# Patient Record
Sex: Female | Born: 1960 | Race: White | Hispanic: No | Marital: Married | Smoking: Former smoker
Health system: Southern US, Community
[De-identification: ages and names within clinical notes are randomized; demographics above are authoritative.]

## PROBLEM LIST (undated history)

## (undated) DIAGNOSIS — I10 Essential (primary) hypertension: Secondary | ICD-10-CM

## (undated) DIAGNOSIS — Z8639 Personal history of other endocrine, nutritional and metabolic disease: Secondary | ICD-10-CM

## (undated) DIAGNOSIS — Z8679 Personal history of other diseases of the circulatory system: Secondary | ICD-10-CM

## (undated) DIAGNOSIS — Z8619 Personal history of other infectious and parasitic diseases: Secondary | ICD-10-CM

## (undated) HISTORY — DX: Personal history of other infectious and parasitic diseases: Z86.19

## (undated) HISTORY — DX: Personal history of other endocrine, nutritional and metabolic disease: Z86.39

## (undated) HISTORY — PX: BACK SURGERY: SHX140

## (undated) HISTORY — DX: Personal history of other diseases of the circulatory system: Z86.79

---

## 1988-11-06 HISTORY — PX: APPENDECTOMY: SHX54

## 2000-01-30 ENCOUNTER — Other Ambulatory Visit: Admission: RE | Admit: 2000-01-30 | Discharge: 2000-01-30 | Payer: Self-pay | Admitting: Family Medicine

## 2000-02-27 ENCOUNTER — Encounter: Payer: Self-pay | Admitting: Family Medicine

## 2000-02-27 ENCOUNTER — Encounter: Admission: RE | Admit: 2000-02-27 | Discharge: 2000-02-27 | Payer: Self-pay | Admitting: Family Medicine

## 2001-02-28 ENCOUNTER — Other Ambulatory Visit: Admission: RE | Admit: 2001-02-28 | Discharge: 2001-02-28 | Payer: Self-pay | Admitting: Family Medicine

## 2001-09-13 ENCOUNTER — Encounter: Admission: RE | Admit: 2001-09-13 | Discharge: 2001-09-13 | Payer: Self-pay | Admitting: Family Medicine

## 2001-09-13 ENCOUNTER — Encounter: Payer: Self-pay | Admitting: Family Medicine

## 2002-03-03 ENCOUNTER — Other Ambulatory Visit: Admission: RE | Admit: 2002-03-03 | Discharge: 2002-03-03 | Payer: Self-pay | Admitting: Family Medicine

## 2002-12-10 ENCOUNTER — Encounter: Payer: Self-pay | Admitting: Family Medicine

## 2002-12-10 ENCOUNTER — Encounter: Admission: RE | Admit: 2002-12-10 | Discharge: 2002-12-10 | Payer: Self-pay | Admitting: Family Medicine

## 2003-04-09 ENCOUNTER — Other Ambulatory Visit: Admission: RE | Admit: 2003-04-09 | Discharge: 2003-04-09 | Payer: Self-pay | Admitting: Family Medicine

## 2004-05-02 ENCOUNTER — Encounter: Admission: RE | Admit: 2004-05-02 | Discharge: 2004-05-02 | Payer: Self-pay | Admitting: Family Medicine

## 2004-12-13 ENCOUNTER — Other Ambulatory Visit: Admission: RE | Admit: 2004-12-13 | Discharge: 2004-12-13 | Payer: Self-pay | Admitting: Family Medicine

## 2004-12-13 ENCOUNTER — Ambulatory Visit: Payer: Self-pay | Admitting: Family Medicine

## 2006-09-04 ENCOUNTER — Ambulatory Visit: Payer: Self-pay | Admitting: Family Medicine

## 2006-10-05 ENCOUNTER — Ambulatory Visit: Payer: Self-pay | Admitting: Family Medicine

## 2006-10-09 ENCOUNTER — Ambulatory Visit: Payer: Self-pay | Admitting: Family Medicine

## 2006-12-27 ENCOUNTER — Encounter (INDEPENDENT_AMBULATORY_CARE_PROVIDER_SITE_OTHER): Payer: Self-pay | Admitting: *Deleted

## 2006-12-27 ENCOUNTER — Ambulatory Visit (HOSPITAL_COMMUNITY): Admission: RE | Admit: 2006-12-27 | Discharge: 2006-12-28 | Payer: Self-pay | Admitting: Specialist

## 2007-11-29 ENCOUNTER — Encounter (INDEPENDENT_AMBULATORY_CARE_PROVIDER_SITE_OTHER): Payer: Self-pay | Admitting: *Deleted

## 2007-12-10 ENCOUNTER — Telehealth: Payer: Self-pay | Admitting: Family Medicine

## 2008-01-14 ENCOUNTER — Encounter: Payer: Self-pay | Admitting: Family Medicine

## 2008-01-14 ENCOUNTER — Ambulatory Visit: Payer: Self-pay | Admitting: Family Medicine

## 2008-01-15 ENCOUNTER — Ambulatory Visit: Payer: Self-pay | Admitting: Family Medicine

## 2008-01-15 DIAGNOSIS — N946 Dysmenorrhea, unspecified: Secondary | ICD-10-CM | POA: Insufficient documentation

## 2008-01-15 DIAGNOSIS — E7800 Pure hypercholesterolemia, unspecified: Secondary | ICD-10-CM

## 2008-01-15 DIAGNOSIS — M5137 Other intervertebral disc degeneration, lumbosacral region: Secondary | ICD-10-CM

## 2008-01-15 DIAGNOSIS — Z87891 Personal history of nicotine dependence: Secondary | ICD-10-CM | POA: Insufficient documentation

## 2008-01-15 DIAGNOSIS — M51379 Other intervertebral disc degeneration, lumbosacral region without mention of lumbar back pain or lower extremity pain: Secondary | ICD-10-CM | POA: Insufficient documentation

## 2008-01-15 DIAGNOSIS — J301 Allergic rhinitis due to pollen: Secondary | ICD-10-CM | POA: Insufficient documentation

## 2008-01-20 ENCOUNTER — Encounter (INDEPENDENT_AMBULATORY_CARE_PROVIDER_SITE_OTHER): Payer: Self-pay | Admitting: *Deleted

## 2008-07-03 ENCOUNTER — Telehealth (INDEPENDENT_AMBULATORY_CARE_PROVIDER_SITE_OTHER): Payer: Self-pay | Admitting: *Deleted

## 2008-10-05 ENCOUNTER — Ambulatory Visit: Payer: Self-pay | Admitting: Family Medicine

## 2008-10-05 ENCOUNTER — Other Ambulatory Visit: Admission: RE | Admit: 2008-10-05 | Discharge: 2008-10-05 | Payer: Self-pay | Admitting: Family Medicine

## 2008-10-05 ENCOUNTER — Encounter: Payer: Self-pay | Admitting: Family Medicine

## 2008-10-05 DIAGNOSIS — R4589 Other symptoms and signs involving emotional state: Secondary | ICD-10-CM | POA: Insufficient documentation

## 2008-10-05 DIAGNOSIS — E039 Hypothyroidism, unspecified: Secondary | ICD-10-CM | POA: Insufficient documentation

## 2008-10-09 ENCOUNTER — Encounter (INDEPENDENT_AMBULATORY_CARE_PROVIDER_SITE_OTHER): Payer: Self-pay | Admitting: *Deleted

## 2008-11-05 ENCOUNTER — Ambulatory Visit: Payer: Self-pay | Admitting: Family Medicine

## 2008-11-09 LAB — CONVERTED CEMR LAB
Alkaline Phosphatase: 105 units/L (ref 39–117)
Basophils Absolute: 0.1 10*3/uL (ref 0.0–0.1)
Basophils Relative: 0.8 % (ref 0.0–3.0)
Bilirubin, Direct: 0.1 mg/dL (ref 0.0–0.3)
Cholesterol: 200 mg/dL (ref 0–200)
Creatinine, Ser: 0.7 mg/dL (ref 0.4–1.2)
Direct LDL: 134.4 mg/dL
Eosinophils Absolute: 0.2 10*3/uL (ref 0.0–0.7)
Free T4: 0.7 ng/dL (ref 0.6–1.6)
GFR calc Af Amer: 115 mL/min
GFR calc non Af Amer: 95 mL/min
HDL: 23.7 mg/dL — ABNORMAL LOW (ref >39.0)
Lymphocytes Relative: 21.2 % (ref 12.0–46.0)
Neutro Abs: 8.4 10*3/uL — ABNORMAL HIGH (ref 1.4–7.7)
Neutrophils Relative %: 72.7 % (ref 43.0–77.0)
Platelets: 335 10*3/uL (ref 150–400)
Potassium: 4.2 meq/L (ref 3.5–5.1)
Triglycerides: 202 mg/dL (ref 0–149)
VLDL: 40 mg/dL (ref 0–40)

## 2010-11-27 ENCOUNTER — Encounter: Payer: Self-pay | Admitting: Family Medicine

## 2011-03-24 NOTE — Op Note (Signed)
NAMECASANDRA, Pam Patel                ACCOUNT NO.:  [id number]   MEDICAL RECORD NO.:  [id number]         PATIENT TYPE:  LOIB   LOCATION:                                FACILITY:  [facility]   PHYSICIAN:  Jene Every, M.D.    DATE OF BIRTH:  05-07-1961   DATE OF PROCEDURE:  12/27/2006  DATE OF DISCHARGE:                               OPERATIVE REPORT   PREOPERATIVE DIAGNOSIS:  Herniated nucleus pulposus, L5-S1, right.   POSTOPERATIVE DIAGNOSIS:  Herniated nucleus pulposus, L5-S1, right.   PROCEDURE PERFORMED:  Lateral recess decompression, foraminotomy of S1,  microdiskectomy, L5-S1.   ANESTHESIA:  General.   ASSISTANT:  Roma Schanz, P.A.   BRIEF HISTORY/INDICATIONS:  A 50 year old with S1 radiculopathy,  compressed nerve root, positive neural tension signs, mild __________  weakness.  MRI indicating focal disk herniation at 5-1, noncompressive  disk protrusion of 4-5.  She was indicated for decompression at 5-1.  The risks and benefits have been discussed, including bleeding,  infection, damage to vascular structures, CSF leakage, epidural  fibrosis, adjacent segment disease, need for fusion in the future,  anesthetic complications, possible rupture, need for surgery at 4-5 disk  space in the future.   PREOPERATIVE HISTORY:  She was tobacco-dependent and was placed on  Chantix and Ativan for tobacco cessation program.   TECHNIQUE:  With the patient in the supine position after the induction  of adequate general anesthesia and 1 gm of Kefzol, she was placed prone  on the [place] frame.  All bony prominences were well padded.  The  lumbar region was prepped and draped in the usual sterile fashion.  Two  18 gauge spinal needles were utilized to localize the 4-5 interspace,  confirmed with x-ray.  An incision was made from the spinous process of  5 to S1.  Subcutaneous tissue was dissected.  Electrocautery was  utilized to achieve hemostasis.  Dorsal lumbar fascia was identified  and  divided along the skin incision.  The paraspinous muscle elevated from  the lamina of 5 and S1.  McCullough retractor was placed.  Operating  microscope was draped and brought into the surgical field, confirmed by  x-ray at the 5-1 space.  Ligamentum flavum was detached from the  cephalad edge of S-1 utilizing a straight curette.  The ligamentum  flavum was removed from the interspace.  Compression of the S1 nerve  root was noted in the lateral recess.  A foraminotomy of S-1 was  performed.  I decompressed the lateral recess at the medial border of  the pedicle, gently mobilized the S-1 nerve root medially.  Identified a  focal HNP, performed an annulotomy.  Copious portion of the disk  material was removed from the disk space with straight and up-biting  pituitary, removing the disk herniation.  Multiple passes.  This freed  the nerve root with then of at least 1 cm of excursion medial to the  pedicle without tension.  I checked the axilla of the root, the shoulder  beneath the thecal sac at the foramen of 4 and 5, and there was no  residual  disk herniation or bony prominences compressing the S-1 root.  It was erythematous and edematous.  I copiously irrigated the disk space  with antibiotic irrigation, utilizing a catheter.  This was then  evacuated.  Inspection revealed no evidence of CSF leakage or active  bleeding.  I placed 1 cc of fentanyl in the epidural space.  Removed the  [facility] retractor.  Paraspinous muscles were inspected.  No evidence  of active bleeding.  The dorsal lumbar fascia was reapproximated with 0  Vicryl simple interrupted sutures.  The subcutaneous tissue was  reapproximated with 2-0 Vicryl simple sutures.  The skin was  reapproximated with Dermabond, reinforced with Steri-Strips.  Sterile  dressing was applied and placed supine on the hospital bed.  Extubated  without difficulty and transported to the recovery room in satisfactory  condition.   The  patient tolerated the procedure well with no complications.  Assistant was [employer].      Jene Every, M.D.  Electronically Signed     JB/MEDQ  D:  12/27/2006  T:  12/27/2006  Job:  [id number]

## 2018-02-20 ENCOUNTER — Inpatient Hospital Stay: Payer: Self-pay

## 2018-02-20 ENCOUNTER — Other Ambulatory Visit: Payer: Self-pay

## 2018-02-20 ENCOUNTER — Emergency Department: Payer: Self-pay

## 2018-02-20 ENCOUNTER — Inpatient Hospital Stay
Admission: EM | Admit: 2018-02-20 | Discharge: 2018-02-23 | DRG: 264 | Disposition: A | Discharge status: Home Health | Payer: Self-pay | Attending: Internal Medicine | Admitting: Internal Medicine

## 2018-02-20 ENCOUNTER — Encounter: Payer: Self-pay | Admitting: Emergency Medicine

## 2018-02-20 DIAGNOSIS — R079 Chest pain, unspecified: Secondary | ICD-10-CM

## 2018-02-20 DIAGNOSIS — Z87891 Personal history of nicotine dependence: Secondary | ICD-10-CM

## 2018-02-20 DIAGNOSIS — I214 Non-ST elevation (NSTEMI) myocardial infarction: Secondary | ICD-10-CM

## 2018-02-20 DIAGNOSIS — E119 Type 2 diabetes mellitus without complications: Secondary | ICD-10-CM | POA: Diagnosis present

## 2018-02-20 DIAGNOSIS — R911 Solitary pulmonary nodule: Secondary | ICD-10-CM | POA: Diagnosis present

## 2018-02-20 DIAGNOSIS — L0211 Cutaneous abscess of neck: Secondary | ICD-10-CM | POA: Diagnosis present

## 2018-02-20 DIAGNOSIS — Z808 Family history of malignant neoplasm of other organs or systems: Secondary | ICD-10-CM

## 2018-02-20 DIAGNOSIS — Z886 Allergy status to analgesic agent status: Secondary | ICD-10-CM

## 2018-02-20 DIAGNOSIS — E785 Hyperlipidemia, unspecified: Secondary | ICD-10-CM | POA: Diagnosis present

## 2018-02-20 DIAGNOSIS — R591 Generalized enlarged lymph nodes: Secondary | ICD-10-CM

## 2018-02-20 DIAGNOSIS — I248 Other forms of acute ischemic heart disease: Secondary | ICD-10-CM | POA: Diagnosis present

## 2018-02-20 DIAGNOSIS — Z8249 Family history of ischemic heart disease and other diseases of the circulatory system: Secondary | ICD-10-CM

## 2018-02-20 DIAGNOSIS — E041 Nontoxic single thyroid nodule: Secondary | ICD-10-CM | POA: Diagnosis present

## 2018-02-20 DIAGNOSIS — R590 Localized enlarged lymph nodes: Secondary | ICD-10-CM | POA: Diagnosis present

## 2018-02-20 DIAGNOSIS — R221 Localized swelling, mass and lump, neck: Secondary | ICD-10-CM | POA: Diagnosis present

## 2018-02-20 DIAGNOSIS — E039 Hypothyroidism, unspecified: Secondary | ICD-10-CM | POA: Diagnosis present

## 2018-02-20 DIAGNOSIS — I808 Phlebitis and thrombophlebitis of other sites: Principal | ICD-10-CM | POA: Diagnosis present

## 2018-02-20 DIAGNOSIS — I82C12 Acute embolism and thrombosis of left internal jugular vein: Secondary | ICD-10-CM | POA: Diagnosis present

## 2018-02-20 DIAGNOSIS — I10 Essential (primary) hypertension: Secondary | ICD-10-CM | POA: Diagnosis present

## 2018-02-20 HISTORY — DX: Essential (primary) hypertension: I10

## 2018-02-20 LAB — BASIC METABOLIC PANEL
Anion gap: 12 (ref 5–15)
BUN: 11 mg/dL (ref 6–20)
CHLORIDE: 100 mmol/L — AB (ref 101–111)
CO2: 20 mmol/L — ABNORMAL LOW (ref 22–32)
Calcium: 10.2 mg/dL (ref 8.9–10.3)
Creatinine, Ser: 0.64 mg/dL (ref 0.44–1.00)
Glucose, Bld: 378 mg/dL — ABNORMAL HIGH (ref 65–99)
POTASSIUM: 3.7 mmol/L (ref 3.5–5.1)
SODIUM: 132 mmol/L — AB (ref 135–145)

## 2018-02-20 LAB — PROTIME-INR
INR: 1.1
Prothrombin Time: 14.1 seconds (ref 11.4–15.2)

## 2018-02-20 LAB — PROCALCITONIN: Procalcitonin: 0.21 ng/mL

## 2018-02-20 LAB — CBC
HEMATOCRIT: 41.9 % (ref 35.0–47.0)
HEMOGLOBIN: 14.3 g/dL (ref 12.0–16.0)
MCH: 30.2 pg (ref 26.0–34.0)
MCHC: 34.2 g/dL (ref 32.0–36.0)
MCV: 88.3 fL (ref 80.0–100.0)
Platelets: 336 10*3/uL (ref 150–440)
RBC: 4.75 MIL/uL (ref 3.80–5.20)
RDW: 12.4 % (ref 11.5–14.5)
WBC: 14.2 10*3/uL — AB (ref 3.6–11.0)

## 2018-02-20 LAB — LACTIC ACID, PLASMA
LACTIC ACID, VENOUS: 1.1 mmol/L (ref 0.5–1.9)
Lactic Acid, Venous: 1.2 mmol/L (ref 0.5–1.9)

## 2018-02-20 LAB — APTT: aPTT: 37 seconds — ABNORMAL HIGH (ref 24–36)

## 2018-02-20 LAB — TROPONIN I
TROPONIN I: 0.18 ng/mL — AB (ref <0.03)
Troponin I: <0.03 ng/mL (ref <0.03)
Troponin I: 0.35 ng/mL (ref <0.03)

## 2018-02-20 LAB — HEPARIN LEVEL (UNFRACTIONATED)

## 2018-02-20 LAB — TSH: TSH: 5.464 u[IU]/mL — AB (ref 0.350–4.500)

## 2018-02-20 MED ORDER — HEPARIN (PORCINE) IN NACL 100-0.45 UNIT/ML-% IJ SOLN
1850.0000 [IU]/h | INTRAMUSCULAR | Status: DC
  Administered 2018-02-20: 900 [IU]/h via INTRAVENOUS
  Administered 2018-02-21: 1450 [IU]/h via INTRAVENOUS
  Administered 2018-02-22: 1850 [IU]/h via INTRAVENOUS
  Administered 2018-02-22: 1700 [IU]/h via INTRAVENOUS
  Filled 2018-02-20 (×4): qty 250

## 2018-02-20 MED ORDER — ATORVASTATIN CALCIUM 20 MG PO TABS
20.0000 mg | ORAL_TABLET | Freq: Every day | ORAL | Status: DC
  Administered 2018-02-20 – 2018-02-22 (×3): 20 mg via ORAL
  Filled 2018-02-20 (×3): qty 1

## 2018-02-20 MED ORDER — NITROGLYCERIN 0.4 MG SL SUBL
SUBLINGUAL_TABLET | SUBLINGUAL | Status: AC
  Administered 2018-02-20: 17:00:00
  Filled 2018-02-20: qty 1

## 2018-02-20 MED ORDER — ASPIRIN EC 81 MG PO TBEC
81.0000 mg | DELAYED_RELEASE_TABLET | Freq: Every day | ORAL | Status: DC
  Administered 2018-02-21 – 2018-02-23 (×3): 81 mg via ORAL
  Filled 2018-02-20 (×3): qty 1

## 2018-02-20 MED ORDER — DOCUSATE SODIUM 100 MG PO CAPS
100.0000 mg | ORAL_CAPSULE | Freq: Two times a day (BID) | ORAL | Status: DC
  Administered 2018-02-21 – 2018-02-23 (×5): 100 mg via ORAL
  Filled 2018-02-20 (×5): qty 1

## 2018-02-20 MED ORDER — SODIUM CHLORIDE 0.9 % IV SOLN
1.0000 g | Freq: Three times a day (TID) | INTRAVENOUS | Status: DC
  Administered 2018-02-20 – 2018-02-22 (×4): 1 g via INTRAVENOUS
  Filled 2018-02-20 (×7): qty 1

## 2018-02-20 MED ORDER — ACETAMINOPHEN 500 MG PO TABS
500.0000 mg | ORAL_TABLET | Freq: Four times a day (QID) | ORAL | Status: DC | PRN
Start: 2018-02-20 — End: 2018-02-23
  Administered 2018-02-20 – 2018-02-21 (×3): 500 mg via ORAL
  Filled 2018-02-20 (×3): qty 1

## 2018-02-20 MED ORDER — VANCOMYCIN HCL IN DEXTROSE 1-5 GM/200ML-% IV SOLN
1000.0000 mg | Freq: Two times a day (BID) | INTRAVENOUS | Status: DC
  Administered 2018-02-21: 1000 mg via INTRAVENOUS
  Filled 2018-02-20 (×2): qty 200

## 2018-02-20 MED ORDER — ONDANSETRON HCL 4 MG PO TABS: 4.0000 mg | ORAL_TABLET | Freq: Four times a day (QID) | ORAL | Status: DC | PRN

## 2018-02-20 MED ORDER — LEVOFLOXACIN IN D5W 750 MG/150ML IV SOLN: 750.0000 mg | Freq: Once | INTRAVENOUS | Status: DC

## 2018-02-20 MED ORDER — VANCOMYCIN HCL IN DEXTROSE 1-5 GM/200ML-% IV SOLN
1000.0000 mg | Freq: Once | INTRAVENOUS | Status: AC
  Administered 2018-02-20: 1000 mg via INTRAVENOUS
  Filled 2018-02-20: qty 200

## 2018-02-20 MED ORDER — SODIUM CHLORIDE 0.9 % IV SOLN
INTRAVENOUS | Status: AC
  Administered 2018-02-20 – 2018-02-21 (×4): via INTRAVENOUS

## 2018-02-20 MED ORDER — LEVOTHYROXINE SODIUM 25 MCG PO TABS
25.0000 ug | ORAL_TABLET | Freq: Every day | ORAL | Status: DC
  Administered 2018-02-21 – 2018-02-23 (×3): 25 ug via ORAL
  Filled 2018-02-20 (×3): qty 1

## 2018-02-20 MED ORDER — SODIUM CHLORIDE 0.9 % IV SOLN: 2.0000 g | Freq: Once | INTRAVENOUS | Status: DC

## 2018-02-20 MED ORDER — TRAMADOL HCL 50 MG PO TABS
50.0000 mg | ORAL_TABLET | Freq: Four times a day (QID) | ORAL | Status: DC | PRN
  Administered 2018-02-20 – 2018-02-21 (×2): 50 mg via ORAL
  Filled 2018-02-20 (×2): qty 1

## 2018-02-20 MED ORDER — IOHEXOL 300 MG/ML  SOLN
75.0000 mL | Freq: Once | INTRAMUSCULAR | Status: AC | PRN
  Administered 2018-02-20: 75 mL via INTRAVENOUS
  Filled 2018-02-20: qty 75

## 2018-02-20 MED ORDER — ONDANSETRON HCL 4 MG/2ML IJ SOLN: 4.0000 mg | Freq: Four times a day (QID) | INTRAMUSCULAR | Status: DC | PRN

## 2018-02-20 MED ORDER — HEPARIN BOLUS VIA INFUSION
4000.0000 [IU] | Freq: Once | INTRAVENOUS | Status: AC
  Administered 2018-02-20: 4000 [IU] via INTRAVENOUS
  Filled 2018-02-20: qty 4000

## 2018-02-20 NOTE — ED Notes (Signed)
Pt back from US at this time.

## 2018-02-20 NOTE — Progress Notes (Signed)
Family Meeting Note  Advance Directive:yes  Today a meeting took place with the Patient.     The following clinical team members were present during this meeting:MD  The following were discussed:Patient's diagnosis: Left-sided neck swelling, with the possibility of cancer versus infection versus abscess, lung nodule, elevated troponin, chest pain, hypothyroidism with elevated TSH, noncompliance with the medications, treatment and plan of care discussed in detail with the patient.  She verbalized understanding of the plan  patient's progosis: Unable to determine and Goals for treatment: Full Code, husband is the healthcare power of attorney  Additional follow-up to be provided: Hospitalist, oncology, infectious disease  Time spent during discussion:17 min  Ramonita LabAruna Sheridan Hew, MD

## 2018-02-20 NOTE — ED Triage Notes (Signed)
PT arrived via ems from home with complaints of chest pain. Pain started an hour prior to ems arrival. Pt states the pain was in the center of her chest with no radiation of pain. Pt states she took 324mg  of asa prior to arrival. Pt denies any current pain.

## 2018-02-20 NOTE — Progress Notes (Signed)
CODE SEPSIS - PHARMACY COMMUNICATION  **Broad Spectrum Antibiotics should be administered within 1 hour of Sepsis diagnosis**  Time Code Sepsis Called/Page Received: 17:22  Antibiotics Ordered: Vanc/Azactam/Levofloxacin  Time of 1st antibiotic administration: Not available - RN spoke with MD and confirms this is not sepsis. They're treating a mass in neck that may be abscess.   Additional action taken by pharmacy: Spoke with Pauline AusKelley RN - she doesn't think this is sepsis - she'll clarify with MD and hang first antibiotic shortly.  If necessary, Name of Provider/Nurse Contacted: Nicholaus BloomKelley.    Pam Patel ,PharmD Clinical Pharmacist  02/20/2018  5:56 PM

## 2018-02-20 NOTE — Progress Notes (Signed)
ANTIBIOTIC CONSULT NOTE - INITIAL  Pharmacy Consult for Vancomycin, meropenem  Indication: abscess , possible sepsis  Allergies  Allergen Reactions  . Penicillins Hives    Has patient had a PCN reaction causing immediate rash, facial/tongue/throat swelling, SOB or lightheadedness with hypotension: Unknown Has patient had a PCN reaction causing severe rash involving mucus membranes or skin necrosis: No Has patient had a PCN reaction that required hospitalization: No Has patient had a PCN reaction occurring within the last 10 years: No If all of the above answers are "NO", then may proceed with Cephalosporin use.   . Naproxen     REACTION: hives    Patient Measurements: Height: 5\' 6"  (167.6 cm) Weight: 182 lb 6.4 oz (82.7 kg) IBW/kg (Calculated) : 59.3 Adjusted Body Weight: 68.7 kg   Vital Signs: Temp: 98.4 F (36.9 C) (04/17 1947) Temp Source: Oral (04/17 1947) BP: 159/96 (04/17 1947) Pulse Rate: 104 (04/17 1947) Intake/Output from previous day: No intake/output data recorded. Intake/Output from this shift: No intake/output data recorded.  Labs: Recent Labs    02/20/18 1230  WBC 14.2*  HGB 14.3  PLT 336  CREATININE 0.64   Estimated Creatinine Clearance: 83.1 mL/min (by C-G formula based on SCr of 0.64 mg/dL). No results for input(s): VANCOTROUGH, VANCOPEAK, VANCORANDOM, GENTTROUGH, GENTPEAK, GENTRANDOM, TOBRATROUGH, TOBRAPEAK, TOBRARND, AMIKACINPEAK, AMIKACINTROU, AMIKACIN in the last 72 hours.   Microbiology: No results found for this or any previous visit (from the past 720 hour(s)).  Medical History: Past Medical History:  Diagnosis Date  . Hypertension     Medications:  Medications Prior to Admission  Medication Sig Dispense Refill Last Dose  . acetaminophen (TYLENOL) 500 MG tablet Take 500 mg by mouth every 6 (six) hours as needed for mild pain.   PRn at PRN  . aspirin EC 81 MG tablet Take 81 mg by mouth daily.   02/20/2018 at 0800   Assessment: CrCl  = 83.1 ml/min  ke = 0.073 hr-1 T1/2 = 9.5 hrs Vd = 48 L   Goal of Therapy:  Vancomycin trough level 15-20 mcg/ml  Plan:  Expected duration 7 days with resolution of temperature and/or normalization of WBC   Meropenem 1 gm IV Q8H ordered to start on 4/17 @ 22:00.  Vancomycin 1 gm IV X 1 to be given on 4/17 @ 22:00. Vancomycin 1 gm IV Q12H ordered to start on 4/18 @ 0400, ~ 6 hrs after 1st dose (stacked dosing).  Will order 1st trough on 4/19 @ 15:30, which will be very close to Css.   Valerye Kobus D 02/20/2018,9:37 PM

## 2018-02-20 NOTE — H&P (Signed)
[facility] - [place] at [facility]   PATIENT NAME: Pam Patel    MR#:  [id number]  DATE OF BIRTH:  [birth year]  DATE OF ADMISSION:  02/20/2018  PRIMARY CARE PHYSICIAN: Tower, Audrie Gallus, MD   REQUESTING/REFERRING PHYSICIAN: Minna Antis, MD  CHIEF COMPLAINT:  Chest pain and left-sided neck swelling  HISTORY OF PRESENT ILLNESS:  Pam Patel  is a 57 y.o. female with a known history of hypothyroidism, stopped taking  Synthroid, essential hypertension is presenting to the ED with a chief complaint of chest pain and left-sided neck swelling.  Patient was reporting 1 month history of toothache and noticed left neck swelling 4-5 days ago with intermittent episodes of pain.  Reports approximately 20 pounds weight loss in the past 1 year but for the past 3 months he has been following diet came into the ED with.  Initial troponin is  less than 0.03 and repeat troponin troponin  0.18.  CT of the neck with contrast has revealed bulky left neck adenopathy centrally necrotic most commonly experiences associated with metastatic squamous cell carcinoma but in the setting of elevated white count it could be inflammatory or infectious.  Right upper lobe spiculated lung nodule and a thrombosed left jugular vein and thyromegaly.  Hospitalist team is called to admit the patient and patient is started on broad-spectrum IV antibiotics and  heparin drip.  During my examination patient is denying any chest pain or shortness of breath some discomfort in the left side of the neck.  No other complaints  PAST MEDICAL HISTORY:   Past Medical History:  Diagnosis Date  . Hypertension   Hypothyroidism   PAST SURGICAL HISTOIRY:   Past Surgical History:  Procedure Laterality Date  . APPENDECTOMY      SOCIAL HISTORY:   Social History   Tobacco Use  . Smoking status: Former Games developer  . Smokeless tobacco: Never Used  Substance Use Topics  . Alcohol use: Not Currently    FAMILY  HISTORY:  Hypertension runs in her family  DRUG ALLERGIES:   Allergies  Allergen Reactions  . Penicillins Hives    Has patient had a PCN reaction causing immediate rash, facial/tongue/throat swelling, SOB or lightheadedness with hypotension: Unknown Has patient had a PCN reaction causing severe rash involving mucus membranes or skin necrosis: No Has patient had a PCN reaction that required hospitalization: No Has patient had a PCN reaction occurring within the last 10 years: No If all of the above answers are "NO", then may proceed with Cephalosporin use.   . Naproxen     REACTION: hives    REVIEW OF SYSTEMS:  CONSTITUTIONAL: No fever, fatigue or weakness.  20 pounds weight loss in the past 1 year EYES: No blurred or double vision.  EARS, NOSE, AND THROAT: No tinnitus or ear pain.  Reporting left neck mass noticed 3-4 days ago RESPIRATORY: No cough, shortness of breath, wheezing or hemoptysis.  CARDIOVASCULAR: chest pain intermittently, denies orthopnea, edema.  GASTROINTESTINAL: No nausea, vomiting, diarrhea or abdominal pain.  GENITOURINARY: No dysuria, hematuria.  ENDOCRINE: No polyuria, nocturia,  HEMATOLOGY: No anemia, easy bruising or bleeding SKIN: No rash or lesion. MUSCULOSKELETAL: No joint pain or arthritis.   NEUROLOGIC: No tingling, numbness, weakness.  PSYCHIATRY: No anxiety or depression.   MEDICATIONS AT HOME:   Prior to Admission medications   Medication Sig Start Date End Date Taking? Authorizing Provider  acetaminophen (TYLENOL) 500 MG tablet Take 500 mg by mouth every 6 (six) hours as needed  for mild pain.   Yes [provider]  aspirin EC 81 MG tablet Take 81 mg by mouth daily.   Yes [provider]      VITAL SIGNS:  Blood pressure (!) 159/96, pulse (!) 104, temperature 98.4 F (36.9 C), temperature source Oral, resp. rate 18, height 5\' 6"  (1.676 m), weight 82.7 kg (182 lb 6.4 oz), SpO2 98 %.  PHYSICAL EXAMINATION:  GENERAL:  57  y.o.-year-old patient lying in the bed with no acute distress.  EYES: Pupils equal, round, reactive to light and accommodation. No scleral icterus. Extraocular muscles intact.  HEENT: Head atraumatic, normocephalic. Oropharynx and nasopharynx clear.  NECK:  Supple, no jugular venous distention.  Left lateral part of the neck palpable firm , mobile mass, nontender, no fluctuance, no purulent discharge noticed LUNGS: Normal breath sounds bilaterally, no wheezing, rales,rhonchi or crepitation. No use of accessory muscles of respiration.  CARDIOVASCULAR: S1, S2 normal. No murmurs, rubs, or gallops.  ABDOMEN: Soft, nontender, nondistended. Bowel sounds present. No organomegaly or mass.  EXTREMITIES: No pedal edema, cyanosis, or clubbing.  NEUROLOGIC: Cranial nerves II through XII are intact. Muscle strength 5/5 in all extremities. Sensation intact. Gait not checked.  PSYCHIATRIC: The patient is alert and oriented x 3.  SKIN: No obvious rash, lesion, or ulcer.   LABORATORY PANEL:   CBC Recent Labs  Lab 02/20/18 1230  WBC 14.2*  HGB 14.3  HCT 41.9  PLT 336   ------------------------------------------------------------------------------------------------------------------  Chemistries  Recent Labs  Lab 02/20/18 1230  NA 132*  K 3.7  CL 100*  CO2 20*  GLUCOSE 378*  BUN 11  CREATININE 0.64  CALCIUM 10.2   ------------------------------------------------------------------------------------------------------------------  Cardiac Enzymes Recent Labs  Lab 02/20/18 1533  TROPONINI 0.18*   ------------------------------------------------------------------------------------------------------------------  RADIOLOGY:  Dg Chest 2 View  Result Date: 02/20/2018 CLINICAL DATA:  Chest pain. EXAM: CHEST - 2 VIEW COMPARISON:  Radiographs of December 27, 2006. FINDINGS: The heart size and mediastinal contours are within normal limits. Both lungs are clear. No pneumothorax or pleural effusion  is noted. The visualized skeletal structures are unremarkable. IMPRESSION: No active cardiopulmonary disease. Electronically Signed   By: Lupita RaiderJames  Green Jr, M.D.   On: 02/20/2018 12:55   Ct Soft Tissue Neck W Contrast  Result Date: 02/20/2018 CLINICAL DATA:  LEFT neck swelling for 5 days. EXAM: CT NECK WITH CONTRAST TECHNIQUE: Multidetector CT imaging of the neck was performed using the standard protocol following the bolus administration of intravenous contrast. CONTRAST:  75mL OMNIPAQUE IOHEXOL 300 MG/ML  SOLN COMPARISON:  None. FINDINGS: Pharynx and larynx: No visible pharyngeal mass or mucosal irregularity. Normal larynx. Salivary glands: No inflammation, mass, or stone. Thyroid: Diffusely enlarged. Recommend further evaluation with thyroid ultrasound. If patient is clinically hyperthyroid, consider nuclear medicine thyroid uptake and scan. Lymph nodes: Necrotic LEFT level 2B node, 19 mm, image 53 series 2. Similarly necrotic LEFT level 3 node, 16 mm. Other smaller nodes, LEFT greater than RIGHT throughout its level II, IV, and V. The LEFT sternocleidomastoid is enlarged and edematous as well. Vascular: Thrombosed LEFT jugular vein inferiorly. Surrounding fluid, greater than would be expected for venous stasis, could represent phlegmon. This extends into the LEFT supraclavicular soft tissues. Atherosclerotic calcification of the carotid arteries, transverse arch, and great vessel origins. Limited intracranial: Negative. Visualized orbits: Negative Mastoids and visualized paranasal sinuses: Mild chronic sclerosis and dependent fluid. Skeleton: Spondylosis. Upper chest: Spiculated nodule RIGHT upper lobe, 6 x 7 mm. Other: None. IMPRESSION: Bulky LEFT neck adenopathy, centrally necrotic;  most commonly, this appearance is associated with metastatic squamous cell carcinoma. No primary pharyngeal lesion is identified however. The degree of inflammatory change surrounding these nodes in the LEFT neck, with fluid and  enlargement of the sternocleidomastoid, in the setting of elevated white count, suggest that they may possibly be inflammatory/infectious. Extranodal spread of tumor is not excluded. Tissue sampling is warranted. Thyromegaly. Recommend further evaluation with thyroid ultrasound. If patient is clinically hyperthyroid, consider nuclear medicine thyroid uptake and scan. Thrombosed LEFT jugular vein. 6 x 7 mm RIGHT upper lobe spiculated nodule. 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 CT chest with contrast recommended for further evaluation. Electronically Signed   By: Elsie Stain M.D.   On: 02/20/2018 17:17   US Thyroid  Result Date: 02/20/2018 CLINICAL DATA:  Incidental on CT.  Thyroid nodule on neck CT EXAM: THYROID ULTRASOUND TECHNIQUE: Ultrasound examination of the thyroid gland and adjacent soft tissues was performed. COMPARISON:  Neck CT - 02/20/2018 FINDINGS: Parenchymal Echotexture: Markedly heterogenous Isthmus: Enlarged measuring 0.2 cm in diameter Right lobe: Enlarged measuring 5.8 x 3.3 x 2.6 cm Left lobe: Enlarged measures 6.1 x 2.7 x 3.1 cm _________________________________________________________ Estimated total number of nodules >/= 1 cm: 1 Number of spongiform nodules >/=  2 cm not described below (TR1): 0 Number of mixed cystic and solid nodules >/= 1.5 cm not described below (TR2): 0 _________________________________________________________ Ronnald Nian approximately 1.2 x 1.0 x 0.8 and approximately 0.8 x 0.7 x 0.7 cm hyperechoic nodules within the right lobe of the thyroid are both favored to represent pseudo nodules given background heterogeneity of the thyroid gland, however regardless do not meet imaging criteria to recommend percutaneous sampling or continued dedicated follow-up. Redemonstrated hypoechoic likely cystic left-sided cervical lymph nodes with dominant lymph node measuring 1.7  cm in greatest short axis diameter (image 69). IMPRESSION: 1. Enlarged and markedly heterogeneous appearing thyroid gland without discrete worrisome thyroid nodule or mass. Findings are nonspecific though could be seen in the setting of a thyroiditis. 2. Cystic, potentially suppurative or necrotic left cervical lymph nodes as demonstrated on preceding contrast-enhanced neck CT. The above is in keeping with the ACR TI-RADS recommendations - J Am Coll Radiol 2017;14:587-595. Electronically Signed   By: Simonne Come M.D.   On: 02/20/2018 18:43    EKG:   Orders placed or performed during the hospital encounter of 02/20/18  . EKG 12-Lead  . EKG 12-Lead  . ED EKG within 10 minutes  . ED EKG within 10 minutes    IMPRESSION AND PLAN:   Pam Patel  is a 57 y.o. female with a known history of hypothyroidism, stopped taking  Synthroid, essential hypertension is presenting to the ED with a chief complaint of chest pain and left-sided neck swelling.  Patient was reporting 1 month history of toothache and noticed left neck swelling 4-5 days ago with intermittent episodes of pain.  Reports approximately 20 pounds weight loss in the past 1 year but for the past 3 months he has been following diet came into the ED with.  Initial troponin is  less than 0.03 and repeat troponin troponin  0.18.  CT of the neck with contrast has revealed bulky left neck adenopathy centrally necrotic most commonly experiences associated with metastatic squamous cell carcinoma but in the setting of elevated white count it could be inflammatory or infectious.  Right upper lobe spiculated lung nodule and a thrombosed left jugular vein  and thyromegaly    #Left-sided neck mass-infectious versus inflammatory versus metastatic squamous cell carcinoma Admit to MedSurg unit Broad-spectrum IV antibiotics Infectious disease and oncology consults were placed for possible biopsy if needed Patient is reporting 20 pounds weight loss in the past 1  year which is concerning  #Jugular vein thrombosis with elevated troponin Patient is started on heparin drip  #Pulmonary nodule spiculated Patient needs CT angiogram of the chest to rule out pulmonary embolism versus septic emboli but as patient has had CT of the neck with contrast need to wait at least 48 hours prior to CT angiogram of the chest Patient is on heparin drip  #Chest pain with elevated troponin Monitor patient on telemetry and cycle cardiac biomarkers Cardiology consult placed an echocardiogram is ordered Aspirin Check fasting lipid panel  #Thyroid nodule with history of hypothyroidism Thyroid ultrasound has revealed heterogeneous appearance of the thyroid but no mass TSH is elevated, will check free T4 and T3 Patient is started on Synthyroid at 25 mcg; patient reports he stopped taking Synthroid without on discussing with her primary care physician    All the records are reviewed and case discussed with ED provider. Management plans discussed with the patient, friend at bedside with patient's permission and they are in agreement.  CODE STATUS: fc   TOTAL TIME TAKING CARE OF THIS PATIENT: 43  minutes.   Note: This dictation was prepared with Dragon dictation along with smaller phrase technology. Any transcriptional errors that result from this process are unintentional.  Ramonita Lab M.D on 02/20/2018 at 7:59 PM  Between 7am to 6pm - Pager - [phone]  After 6pm go to [email or web address] - password [email or web address] [facility]  [facility]  Office  [phone]  CC: Primary care physician; Tower, Audrie Gallus, MD

## 2018-02-20 NOTE — ED Provider Notes (Signed)
[facility] Emergency Department Provider Note  Time seen: 4:20 PM  I have reviewed the triage vital signs and the nursing notes.   HISTORY  Chief Complaint Chest Pain    HPI Pam Patel is a 57 y.o. female with a past medical history of hypertension, presents to the emergency department for neck pain, chest pain, neck swelling.  According to the patient for the past 1 month she has not been feeling well which she describes as generalized fatigue, nausea.  States she had a toothache approximately 1 week ago, states over the past for 5 days she has noticed swelling and pain to the left side of her neck with increased neck pain.  This morning she was having palpitations which she describes as her heart pounding in her chest with chest pain.  Called EMS and was noted to have a blood pressure of 210 so they transported her to the emergency department.  Patient admits she felt very anxious and stressed out, states she was feeling tingling in her hands especially her right hand.  She says on the way to the hospital the chest pain went away, she did receive 1 nitroglycerin spray and 4 baby aspirin.  States upon arrival to the emergency department she had no chest pain.  Patient's main complaint now continues to be left neck pain and swelling to the left neck.  States this has been ongoing since the weekend.  Patient states while she was having the chest discomfort she was a little sweaty and possibly somewhat short of breath although she does admit that she was very anxious and felt almost like a "panic attack" per patient.    Past Medical History:  Diagnosis Date  . Hypertension     Patient Active Problem List   Diagnosis Date Noted  . HYPOTHYROIDISM 10/05/2008  . STRESS REACTION, ACUTE, WITH EMOTIONAL DISTURBANCE 10/05/2008  . HYPERCHOLESTEROLEMIA 01/15/2008  . TOBACCO ABUSE 01/15/2008  . ALLERGIC RHINITIS, SEASONAL 01/15/2008  . DYSMENORRHEA 01/15/2008  .  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 APPENDECTOMY      Prior to Admission medications   Not on File    Allergies  Allergen Reactions  . Naproxen     REACTION: hives  . Penicillins     REACTION: hives    No family history on file.  Social History Social History   Tobacco Use  . Smoking status: Former Games developer  . Smokeless tobacco: Never Used  Substance Use Topics  . Alcohol use: Not Currently  . Drug use: Never    Review of Systems Constitutional: Negative for fever.  Positive generalized fatigue/weakness times 1 month Eyes: Negative for visual complaints ENT: States left neck pain and swelling.  Right-sided toothache 1 week ago. Cardiovascular: Chest pain earlier today, now resolved Respiratory: Somewhat short of breath when her chest was hurting earlier today Gastrointestinal: Negative for abdominal pain, vomiting.  States intermittent diarrhea over the past 1 month. Genitourinary: Negative for urinary compaints Musculoskeletal: Negative for musculoskeletal complaints Skin: Negative for skin complaints  Neurological: Negative for headache All other ROS negative  ____________________________________________   PHYSICAL EXAM:  VITAL SIGNS: ED Triage Vitals [02/20/18 1215]  Enc Vitals Group     BP (!) 144/97     Pulse Rate (!) 111     Resp 18     Temp 98.3 F (36.8 C)     Temp Source Oral  SpO2      Weight 182 lb 6.4 oz (82.7 kg)     Height 5\' 6"  (1.676 m)     Head Circumference      Peak Flow      Pain Score 0     Pain Loc      Pain Edu?      Excl. in GC?     Constitutional: Alert and oriented. Well appearing and in no distress. Eyes: Normal exam ENT   Head: Atraumatic.  Patient does have palpable swelling to the left neck, is not clear if this is lymphadenopathy versus muscular versus mass.  States somewhat tender during palpation.   Mouth/Throat: Mucous membranes are  moist. Cardiovascular: Normal rate, regular rhythm. No murmur Respiratory: Normal respiratory effort without tachypnea nor retractions. Breath sounds are clear  Gastrointestinal: Soft and nontender. No distention. Musculoskeletal: Nontender with normal range of motion in all extremities. No lower extremity tenderness Neurologic:  Normal speech and language. No gross focal neurologic deficits Skin:  Skin is warm, dry and intact.  Psychiatric: Mood and affect are normal.  ____________________________________________    EKG  EKG reviewed and interpreted by myself shows sinus tachycardia 116 bpm, narrow QRS, normal axis, normal intervals, nonspecific but no concerning ST changes.  ____________________________________________    RADIOLOGY  Chest x-ray negative CT scan shows several concerning findings bulky left neck adenopathy with essentially necrotic area could be metastatic squamous cell carcinoma, could be infectious or inflammatory.  Tissue sampling recommended. Enlarged thyroid, patient states she is supposed to be taking Synthroid but has not been taking it. Thrombosed left jugular vein. 6 x 7 mm nodule in the right upper lobe could represent primary lung lesion versus septic emboli from thrombosed left IJ.  CT chest recommended.  ____________________________________________   INITIAL IMPRESSION / ASSESSMENT AND PLAN / ED COURSE  Pertinent labs & imaging results that were available during my care of the patient were reviewed by me and considered in my medical decision making (see chart for details).  Patient presents to the emergency department for chest pain, neck pain and left neck swelling.  As far as the chest pain patient states she has been experiencing intermittent chest pain over the past several weeks, but the chest pain this morning was worse she describes more as a pounding sensation in her chest with moderate left-sided chest pain.  Differential would include ACS,  anxiety, palpitations, chest wall pain.  Patient also states for the past for 5 days she has been expensing worsening left neck pain with swelling.  Patient does have a palpable mass to this area which could be muscular versus lymphadenopathy versus mass.  We will obtain a CT scan to further evaluate.  Patient's workup so far is largely nonrevealing mild leukocytosis of 14,000, troponin is negative.  Denies any chest pain whatsoever currently.  No pleuritic pain, no shortness of breath.  Will obtain a repeat troponin and continue to closely monitor.  Patient's repeat troponin is elevated 0.18.  Given the elevation and concerning chest pain earlier today we will place on a heparin infusion.  Patient CT scan of the neck is concerning for infection versus cancerous lesion.  Also with a lung nodule/mass versus septic emboli.  We will check blood cultures, start the patient on IV antibiotics.  Patient will be on a heparin infusion.  Will require CT angiography of the chest however as the patient just received a CT scan with contrast of the neck we will hold off on  further scanning at this point to allow the patient some time in between contrast studies as the patient is already covered with heparin and IV antibiotics.  I discussed the several possibilities of the CT scan including cancer versus infection.  We will admit to the hospitalist service.  Patient agreeable to this plan of care.  CRITICAL CARE Performed by: Minna AntisKevin Thomasa Heidler   Total critical care time: 45 minutes  Critical care time was exclusive of separately billable procedures and treating other patients.  Critical care was necessary to treat or prevent imminent or life-threatening deterioration.  Critical care was time spent personally by me on the following activities: development of treatment plan with patient and/or surrogate as well as nursing, discussions with consultants, evaluation of patient's response to treatment, examination of  patient, obtaining history from patient or surrogate, ordering and performing treatments and interventions, ordering and review of laboratory studies, ordering and review of radiographic studies, pulse oximetry and re-evaluation of patient's condition.   ____________________________________________   FINAL CLINICAL IMPRESSION(S) / ED DIAGNOSES  Chest pain Neck pain Thrombosed internal jugular NSTEMI Neck mass   Minna AntisPaduchowski, Dhruva Orndoff, MD 02/20/18 1737

## 2018-02-20 NOTE — Progress Notes (Signed)
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

## 2018-02-20 NOTE — ED Notes (Signed)
Date and time results received: 02/20/18 1638   Test: troponin Critical Value: 0.18  Name of Provider Notified: Dr. Lenard LancePaduchowski  Orders Received? Or Actions Taken?: Orders Received - See Orders for details

## 2018-02-20 NOTE — Progress Notes (Addendum)
ANTICOAGULATION CONSULT NOTE - Initial Consult  Pharmacy Consult for heparin Indication: chest pain/ACS  Allergies  Allergen Reactions  . Naproxen     REACTION: hives  . Penicillins     REACTION: hives    Patient Measurements: Height: 5\' 6"  (167.6 cm) Weight: 182 lb 6.4 oz (82.7 kg) IBW/kg (Calculated) : 59.3 Heparin Dosing Weight: 76.7 kg  Vital Signs: Temp: 98.3 F (36.8 C) (04/17 1215) Temp Source: Oral (04/17 1215) BP: 144/97 (04/17 1215) Pulse Rate: 111 (04/17 1215)  Labs: Recent Labs    02/20/18 1230 02/20/18 1533  HGB 14.3  --   HCT 41.9  --   PLT 336  --   CREATININE 0.64  --   TROPONINI <0.03 0.18*    Estimated Creatinine Clearance: 83.1 mL/min (by C-G formula based on SCr of 0.64 mg/dL).   Medical History: Past Medical History:  Diagnosis Date  . Hypertension     Medications:  Infusions:  . aztreonam    . heparin    . levofloxacin (LEVAQUIN) IV    . vancomycin      Assessment: 57 yof cc chest pain with PMH HTN. Troponin <0.03 -- 0.18 in ED. ECG with sinus tachycardia, possible LAE, and nonspecific ST abnormality. Pharmacy consulted to dose heparin for ACS. PTA list is not completed - will follow up when available.  Goal of Therapy:  Heparin level 0.3-0.7 units/ml Monitor platelets by anticoagulation protocol: Yes   Plan:  Give 4000 units bolus x 1 Start heparin infusion at 900 units/hr Check anti-Xa level in 6 hours and daily while on heparin Continue to monitor H&H and platelets   4/17 17:37 baseline aPTT 37, INR 1.1. No PTA OAC noted on updated PTA med list. Continue as above.   Carola FrostNathan A Kmari Brian, Pharm>D., BCPS Clinical Pharmacist 02/20/2018,5:38 PM

## 2018-02-20 NOTE — ED Triage Notes (Signed)
Pt in via EMS with c/o pressure like chest pain. EMS reports pt c/o pain in her neck first and took 4 baby asa. Pt reported relief with 1 nitro spray.

## 2018-02-20 NOTE — ED Notes (Signed)
Pt came back from CT and began shaking stating that her chest is hurting again.

## 2018-02-20 NOTE — ED Notes (Signed)
Pt to US at this time.

## 2018-02-21 ENCOUNTER — Encounter: Payer: Self-pay | Admitting: Internal Medicine

## 2018-02-21 ENCOUNTER — Inpatient Hospital Stay (HOSPITAL_COMMUNITY)
Admit: 2018-02-21 | Discharge: 2018-02-21 | Disposition: A | Discharge status: Home / Self Care | Payer: Self-pay | Attending: Internal Medicine | Admitting: Internal Medicine

## 2018-02-21 ENCOUNTER — Inpatient Hospital Stay: Payer: Self-pay

## 2018-02-21 DIAGNOSIS — Z87891 Personal history of nicotine dependence: Secondary | ICD-10-CM

## 2018-02-21 DIAGNOSIS — E041 Nontoxic single thyroid nodule: Secondary | ICD-10-CM

## 2018-02-21 DIAGNOSIS — R221 Localized swelling, mass and lump, neck: Secondary | ICD-10-CM

## 2018-02-21 DIAGNOSIS — I82C12 Acute embolism and thrombosis of left internal jugular vein: Secondary | ICD-10-CM

## 2018-02-21 DIAGNOSIS — R079 Chest pain, unspecified: Secondary | ICD-10-CM

## 2018-02-21 DIAGNOSIS — I10 Essential (primary) hypertension: Secondary | ICD-10-CM

## 2018-02-21 DIAGNOSIS — Z7982 Long term (current) use of aspirin: Secondary | ICD-10-CM

## 2018-02-21 DIAGNOSIS — R7989 Other specified abnormal findings of blood chemistry: Secondary | ICD-10-CM

## 2018-02-21 LAB — COMPREHENSIVE METABOLIC PANEL
ALBUMIN: 3.3 g/dL — AB (ref 3.5–5.0)
ALK PHOS: 108 U/L (ref 38–126)
ALT: 20 U/L (ref 14–54)
AST: 22 U/L (ref 15–41)
Anion gap: 6 (ref 5–15)
BILIRUBIN TOTAL: 0.4 mg/dL (ref 0.3–1.2)
BUN: 9 mg/dL (ref 6–20)
CO2: 24 mmol/L (ref 22–32)
Calcium: 9.4 mg/dL (ref 8.9–10.3)
Chloride: 104 mmol/L (ref 101–111)
Creatinine, Ser: 0.49 mg/dL (ref 0.44–1.00)
GFR calc Af Amer: >60 mL/min (ref >60)
GFR calc non Af Amer: >60 mL/min (ref >60)
GLUCOSE: 276 mg/dL — AB (ref 65–99)
Potassium: 3.7 mmol/L (ref 3.5–5.1)
Sodium: 134 mmol/L — ABNORMAL LOW (ref 135–145)
Total Protein: 7.4 g/dL (ref 6.5–8.1)

## 2018-02-21 LAB — T4, FREE: FREE T4: 0.72 ng/dL (ref 0.61–1.12)

## 2018-02-21 LAB — HEPARIN LEVEL (UNFRACTIONATED)
HEPARIN UNFRACTIONATED: 0.16 [IU]/mL — AB (ref 0.30–0.70)
Heparin Unfractionated: 0.13 IU/mL — ABNORMAL LOW (ref 0.30–0.70)

## 2018-02-21 LAB — LIPID PANEL
Cholesterol: 174 mg/dL (ref 0–200)
HDL: 26 mg/dL — AB (ref >40)
LDL CALC: 83 mg/dL (ref 0–99)
Total CHOL/HDL Ratio: 6.7 RATIO
Triglycerides: 324 mg/dL — ABNORMAL HIGH (ref <150)
VLDL: 65 mg/dL — ABNORMAL HIGH (ref 0–40)

## 2018-02-21 LAB — HEMOGLOBIN A1C
HEMOGLOBIN A1C: 10.6 % — AB (ref 4.8–5.6)
MEAN PLASMA GLUCOSE: 257.52 mg/dL

## 2018-02-21 LAB — TSH: TSH: 17.867 u[IU]/mL — AB (ref 0.350–4.500)

## 2018-02-21 LAB — CBC
HEMATOCRIT: 40.4 % (ref 35.0–47.0)
HEMOGLOBIN: 14 g/dL (ref 12.0–16.0)
MCH: 30 pg (ref 26.0–34.0)
MCHC: 34.6 g/dL (ref 32.0–36.0)
MCV: 86.6 fL (ref 80.0–100.0)
Platelets: 340 10*3/uL (ref 150–440)
RBC: 4.66 MIL/uL (ref 3.80–5.20)
RDW: 12.4 % (ref 11.5–14.5)
WBC: 14.1 10*3/uL — AB (ref 3.6–11.0)

## 2018-02-21 LAB — GLUCOSE, CAPILLARY
GLUCOSE-CAPILLARY: 284 mg/dL — AB (ref 65–99)
Glucose-Capillary: 246 mg/dL — ABNORMAL HIGH (ref 65–99)
Glucose-Capillary: 201 mg/dL — ABNORMAL HIGH (ref 65–99)
Glucose-Capillary: 200 mg/dL — ABNORMAL HIGH (ref 65–99)

## 2018-02-21 LAB — ECHOCARDIOGRAM COMPLETE
HEIGHTINCHES: 66 in
WEIGHTICAEL: 2918.4 [oz_av]

## 2018-02-21 LAB — LACTATE DEHYDROGENASE: LDH: 121 U/L (ref 98–192)

## 2018-02-21 LAB — URIC ACID: Uric Acid, Serum: 4.3 mg/dL (ref 2.3–6.6)

## 2018-02-21 LAB — TROPONIN I
TROPONIN I: 0.32 ng/mL — AB (ref <0.03)
Troponin I: 0.23 ng/mL (ref <0.03)

## 2018-02-21 LAB — PROCALCITONIN: PROCALCITONIN: 0.14 ng/mL

## 2018-02-21 LAB — CK: CK TOTAL: 46 U/L (ref 38–234)

## 2018-02-21 MED ORDER — LIVING WELL WITH DIABETES BOOK
Freq: Once | Status: DC
  Filled 2018-02-21: qty 1

## 2018-02-21 MED ORDER — HEPARIN BOLUS VIA INFUSION
2300.0000 [IU] | Freq: Once | INTRAVENOUS | Status: AC
  Administered 2018-02-21: 01:00:00 2300 [IU] via INTRAVENOUS
  Filled 2018-02-21: qty 2300

## 2018-02-21 MED ORDER — HYDRALAZINE HCL 20 MG/ML IJ SOLN
10.0000 mg | Freq: Once | INTRAMUSCULAR | Status: AC
  Administered 2018-02-21: 10 mg via INTRAVENOUS
  Filled 2018-02-21: qty 1

## 2018-02-21 MED ORDER — LISINOPRIL 20 MG PO TABS
40.0000 mg | ORAL_TABLET | Freq: Every day | ORAL | Status: DC
  Administered 2018-02-21 – 2018-02-23 (×3): 40 mg via ORAL
  Filled 2018-02-21 (×3): qty 2

## 2018-02-21 MED ORDER — INSULIN ASPART 100 UNIT/ML ~~LOC~~ SOLN
0.0000 [IU] | Freq: Three times a day (TID) | SUBCUTANEOUS | Status: DC
  Administered 2018-02-21: 3 [IU] via SUBCUTANEOUS
  Administered 2018-02-21: 10:00:00 5 [IU] via SUBCUTANEOUS
  Administered 2018-02-22: 3 [IU] via SUBCUTANEOUS
  Administered 2018-02-22: 5 [IU] via SUBCUTANEOUS
  Administered 2018-02-22 – 2018-02-23 (×3): 3 [IU] via SUBCUTANEOUS
  Filled 2018-02-21 (×7): qty 1

## 2018-02-21 MED ORDER — DEXAMETHASONE 4 MG PO TABS
4.0000 mg | ORAL_TABLET | Freq: Two times a day (BID) | ORAL | Status: DC
  Administered 2018-02-21 – 2018-02-22 (×2): 4 mg via ORAL
  Filled 2018-02-21 (×2): qty 1

## 2018-02-21 MED ORDER — LORAZEPAM 0.5 MG PO TABS
0.5000 mg | ORAL_TABLET | Freq: Four times a day (QID) | ORAL | Status: DC | PRN
  Administered 2018-02-21 – 2018-02-22 (×3): 0.5 mg via ORAL
  Filled 2018-02-21 (×3): qty 1

## 2018-02-21 MED ORDER — INSULIN GLARGINE 100 UNIT/ML ~~LOC~~ SOLN
12.0000 [IU] | Freq: Every day | SUBCUTANEOUS | Status: DC
  Administered 2018-02-21 – 2018-02-22 (×2): 12 [IU] via SUBCUTANEOUS
  Filled 2018-02-21 (×2): qty 0.12

## 2018-02-21 MED ORDER — HEPARIN BOLUS VIA INFUSION
2300.0000 [IU] | Freq: Once | INTRAVENOUS | Status: AC
  Administered 2018-02-21: 08:00:00 2300 [IU] via INTRAVENOUS
  Filled 2018-02-21: qty 2300

## 2018-02-21 MED ORDER — HEPARIN BOLUS VIA INFUSION
2300.0000 [IU] | Freq: Once | INTRAVENOUS | Status: AC
  Administered 2018-02-21: 2300 [IU] via INTRAVENOUS
  Filled 2018-02-21: qty 2300

## 2018-02-21 MED ORDER — LABETALOL HCL 5 MG/ML IV SOLN: 10.0000 mg | INTRAVENOUS | Status: DC | PRN

## 2018-02-21 MED ORDER — INSULIN ASPART 100 UNIT/ML ~~LOC~~ SOLN: 0.0000 [IU] | Freq: Three times a day (TID) | SUBCUTANEOUS | Status: DC

## 2018-02-21 MED ORDER — INSULIN ASPART 100 UNIT/ML ~~LOC~~ SOLN: 0.0000 [IU] | Freq: Every day | SUBCUTANEOUS | Status: DC

## 2018-02-21 MED ORDER — MORPHINE SULFATE (PF) 2 MG/ML IV SOLN
2.0000 mg | INTRAVENOUS | Status: DC | PRN
  Administered 2018-02-21: 2 mg via INTRAVENOUS
  Filled 2018-02-21: qty 1

## 2018-02-21 NOTE — Progress Notes (Signed)
[facility] [employer] - [place] [facility]   PATIENT NAME: Pam Patel    MR#:  [id number]  DATE OF BIRTH:  [birth year]  SUBJECTIVE:  CHIEF COMPLAINT:   Chief Complaint  Patient presents with  . Chest Pain   Patient blood pressure severely elevated greater than 200 systolic.  Elevated troponin.  On heparin drip.  Pain in the neck and chest.  REVIEW OF SYSTEMS:    Review of Systems  Constitutional: Positive for diaphoresis, malaise/fatigue and weight loss. Negative for chills and fever.  HENT: Negative for sore throat.   Eyes: Negative for blurred vision, double vision and pain.  Respiratory: Negative for cough, hemoptysis, shortness of breath and wheezing.   Cardiovascular: Positive for chest pain. Negative for palpitations, orthopnea and leg swelling.  Gastrointestinal: Positive for nausea. Negative for abdominal pain, constipation, diarrhea, heartburn and vomiting.  Genitourinary: Negative for dysuria and hematuria.  Musculoskeletal: Positive for neck pain. Negative for back pain and joint pain.  Skin: Negative for rash.  Neurological: Negative for sensory change, speech change, focal weakness and headaches.  Endo/Heme/Allergies: Does not bruise/bleed easily.  Psychiatric/Behavioral: Negative for depression. The patient is not nervous/anxious.     DRUG ALLERGIES:   Allergies  Allergen Reactions  . Penicillins Hives    Has patient had a PCN reaction causing immediate rash, facial/tongue/throat swelling, SOB or lightheadedness with hypotension: Unknown Has patient had a PCN reaction causing severe rash involving mucus membranes or skin necrosis: No Has patient had a PCN reaction that required hospitalization: No Has patient had a PCN reaction occurring within the last 10 years: No If all of the above answers are "NO", then may proceed with Cephalosporin use.   . Naproxen     REACTION: hives    VITALS:  Blood pressure (!) 181/105, pulse (!) 122, temperature 98.3  F (36.8 C), temperature source Oral, resp. rate 18, height 5\' 6"  (1.676 m), weight 82.7 kg (182 lb 6.4 oz), SpO2 100 %.  PHYSICAL EXAMINATION:   Physical Exam  GENERAL:  57 y.o.-year-old patient lying in the bed with no acute distress.  EYES: Pupils equal, round, reactive to light and accommodation. No scleral icterus. Extraocular muscles intact.  HEENT: Head atraumatic, normocephalic. Oropharynx and nasopharynx clear.  Fullness and swelling left lateral neck NECK:  Supple, no jugular venous distention. No thyroid enlargement, no tenderness.  LUNGS: Normal breath sounds bilaterally, no wheezing, rales, rhonchi. No use of accessory muscles of respiration.  CARDIOVASCULAR: S1, S2 normal. No murmurs, rubs, or gallops.  ABDOMEN: Soft, nontender, nondistended. Bowel sounds present. No organomegaly or mass.  EXTREMITIES: No cyanosis, clubbing or edema b/l.    NEUROLOGIC: Cranial nerves II through XII are intact. No focal Motor or sensory deficits b/l.   PSYCHIATRIC: The patient is alert and oriented x 3.  Anxious SKIN: No obvious rash, lesion, or ulcer.   LABORATORY PANEL:   CBC Recent Labs  Lab 02/21/18 0206  WBC 14.1*  HGB 14.0  HCT 40.4  PLT 340   ------------------------------------------------------------------------------------------------------------------ Chemistries  Recent Labs  Lab 02/21/18 0206  NA 134*  K 3.7  CL 104  CO2 24  GLUCOSE 276*  BUN 9  CREATININE 0.49  CALCIUM 9.4  AST 22  ALT 20  ALKPHOS 108  BILITOT 0.4   ------------------------------------------------------------------------------------------------------------------  Cardiac Enzymes Recent Labs  Lab 02/21/18 0753  TROPONINI 0.23*   ------------------------------------------------------------------------------------------------------------------  RADIOLOGY:  Dg Chest 2 View  Result Date: 02/20/2018 CLINICAL DATA:  Chest pain. EXAM: CHEST - 2 VIEW  COMPARISON:  Radiographs of December 27, 2006. FINDINGS: The heart size and mediastinal contours are within normal limits. Both lungs are clear. No pneumothorax or pleural effusion is noted. The visualized skeletal structures are unremarkable. IMPRESSION: No active cardiopulmonary disease. Electronically Signed   By: Lupita Raider, M.D.   On: 02/20/2018 12:55   Ct Soft Tissue Neck W Contrast  Result Date: 02/20/2018 CLINICAL DATA:  LEFT neck swelling for 5 days. EXAM: CT NECK WITH CONTRAST TECHNIQUE: Multidetector CT imaging of the neck was performed using the standard protocol following the bolus administration of intravenous contrast. CONTRAST:  75mL OMNIPAQUE IOHEXOL 300 MG/ML  SOLN COMPARISON:  None. FINDINGS: Pharynx and larynx: No visible pharyngeal mass or mucosal irregularity. Normal larynx. Salivary glands: No inflammation, mass, or stone. Thyroid: Diffusely enlarged. Recommend further evaluation with thyroid ultrasound. If patient is clinically hyperthyroid, consider nuclear medicine thyroid uptake and scan. Lymph nodes: Necrotic LEFT level 2B node, 19 mm, image 53 series 2. Similarly necrotic LEFT level 3 node, 16 mm. Other smaller nodes, LEFT greater than RIGHT throughout its level II, IV, and V. The LEFT sternocleidomastoid is enlarged and edematous as well. Vascular: Thrombosed LEFT jugular vein inferiorly. Surrounding fluid, greater than would be expected for venous stasis, could represent phlegmon. This extends into the LEFT supraclavicular soft tissues. Atherosclerotic calcification of the carotid arteries, transverse arch, and great vessel origins. Limited intracranial: Negative. Visualized orbits: Negative Mastoids and visualized paranasal sinuses: Mild chronic sclerosis and dependent fluid. Skeleton: Spondylosis. Upper chest: Spiculated nodule RIGHT upper lobe, 6 x 7 mm. Other: None. IMPRESSION: Bulky LEFT neck adenopathy, centrally necrotic; most commonly, this appearance is associated with metastatic squamous cell carcinoma.  No primary pharyngeal lesion is identified however. The degree of inflammatory change surrounding these nodes in the LEFT neck, with fluid and enlargement of the sternocleidomastoid, in the setting of elevated white count, suggest that they may possibly be inflammatory/infectious. Extranodal spread of tumor is not excluded. Tissue sampling is warranted. Thyromegaly. Recommend further evaluation with thyroid ultrasound. If patient is clinically hyperthyroid, consider nuclear medicine thyroid uptake and scan. Thrombosed LEFT jugular vein. 6 x 7 mm RIGHT upper lobe spiculated nodule. 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 CT chest with contrast recommended for further evaluation. Electronically Signed   By: Elsie Stain M.D.   On: 02/20/2018 17:17   US Thyroid  Result Date: 02/20/2018 CLINICAL DATA:  Incidental on CT.  Thyroid nodule on neck CT EXAM: THYROID ULTRASOUND TECHNIQUE: Ultrasound examination of the thyroid gland and adjacent soft tissues was performed. COMPARISON:  Neck CT - 02/20/2018 FINDINGS: Parenchymal Echotexture: Markedly heterogenous Isthmus: Enlarged measuring 0.2 cm in diameter Right lobe: Enlarged measuring 5.8 x 3.3 x 2.6 cm Left lobe: Enlarged measures 6.1 x 2.7 x 3.1 cm _________________________________________________________ Estimated total number of nodules >/= 1 cm: 1 Number of spongiform nodules >/=  2 cm not described below (TR1): 0 Number of mixed cystic and solid nodules >/= 1.5 cm not described below (TR2): 0 _________________________________________________________ Ronnald Nian approximately 1.2 x 1.0 x 0.8 and approximately 0.8 x 0.7 x 0.7 cm hyperechoic nodules within the right lobe of the thyroid are both favored to represent pseudo nodules given background heterogeneity of the thyroid gland, however regardless do not meet imaging criteria to recommend percutaneous sampling or  continued dedicated follow-up. Redemonstrated hypoechoic likely cystic left-sided cervical lymph nodes with dominant lymph node measuring 1.7 cm in greatest short  axis diameter (image 69). IMPRESSION: 1. Enlarged and markedly heterogeneous appearing thyroid gland without discrete worrisome thyroid nodule or mass. Findings are nonspecific though could be seen in the setting of a thyroiditis. 2. Cystic, potentially suppurative or necrotic left cervical lymph nodes as demonstrated on preceding contrast-enhanced neck CT. The above is in keeping with the ACR TI-RADS recommendations - J Am Coll Radiol 2017;14:587-595. Electronically Signed   By: Simonne ComeJohn  Watts M.D.   On: 02/20/2018 18:43     ASSESSMENT AND PLAN:   * Elevated troponin.  Non-ST elevation MI versus demand ischemia.  On heparin drip.  Check echocardiogram.  Consult cardiology. ASA.  * Right neck lymph node necrosis.  Likely infection.  Could be tumor.  Biopsy through IR.  Appreciate ENT input.  *Right lung mass vs infection.  Patient does have history of smoking.  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  Appreciate ID input On IV antibiotics  *Left IJ thrombosis.  On heparin drip.  Vascular surgery consulted.  *Accelerated hypertension.  Stat dose of IV hydralazine given.  Also start lisinopril.  *New onset diabetes mellitus. Started Lantus 12 units.  Sliding scale insulin added.  *DVT prophylaxis.  On heparin drip.  All the records are reviewed and case discussed with Care Management/Social Workerr. Management plans discussed with the patient, family and they are in agreement.  CODE STATUS: FULL CODE  DVT Prophylaxis: SCDs  TOTAL CC TIME TAKING CARE OF THIS PATIENT: 35 minutes.   POSSIBLE D/C IN 3-4 DAYS, DEPENDING ON CLINICAL CONDITION.  Orie FishermanSrikar R Rickesha Veracruz M.D on 02/21/2018 at 11:55 AM  Between 7am to 6pm - Pager - [phone]  After 6pm go to [email or web address] - password [email or web address] [facility]  [facility] [place] [facility]  Office   [phone]  CC: Primary care physician; Tower, Audrie GallusMarne A, MD  Note: This dictation was prepared with Dragon dictation along with smaller phrase technology. Any transcriptional errors that result from this process are unintentional.

## 2018-02-21 NOTE — Progress Notes (Signed)
Blood pressure elevated this AM at 197/97.  Pt with hx of HTN but has not been on any medication to treat it. Text paged [facility] doc. Henriette CombsSarah Johntay Doolen RN

## 2018-02-21 NOTE — Progress Notes (Signed)
*  PRELIMINARY RESULTS* Echocardiogram 2D Echocardiogram has been performed.  Pam Patel 02/21/2018, 2:57 PM 

## 2018-02-21 NOTE — Consult Note (Signed)
[facility] Infectious Disease     Reason for Consult: Neck mass   Referring Physician: Nicholes Mango Date of Admission:  02/20/2018   Active Problems:   Neck mass   HPI: Pam Patel is a 57 y.o. female admitted with several days of  L neck pain and chest pain and a month of toothpain. She has had 20 # wt loss. On admit WBC 14, no fevers. CT neck with necrotic LN, jugular vein thrombosis and fluid surrounding.  Spiculated lesion in Lungs. Started on meropenem and seen by ENT. She did report a ST a few weeks ago and was seen at [facility] and given abx - she thinks amox.   Past Medical History:  Diagnosis Date  . Hypertension    Past Surgical History:  Procedure Laterality Date  . APPENDECTOMY     Social History   Tobacco Use  . Smoking status: Former Research scientist (life sciences)  . Smokeless tobacco: Never Used  Substance Use Topics  . Alcohol use: Not Currently  . Drug use: Never   History reviewed. No pertinent family history.  Allergies:  Allergies  Allergen Reactions  . Penicillins Hives    Has patient had a PCN reaction causing immediate rash, facial/tongue/throat swelling, SOB or lightheadedness with hypotension: Unknown Has patient had a PCN reaction causing severe rash involving mucus membranes or skin necrosis: No Has patient had a PCN reaction that required hospitalization: No Has patient had a PCN reaction occurring within the last 10 years: No If all of the above answers are "NO", then may proceed with Cephalosporin use.   . Naproxen     REACTION: hives    Current antibiotics: Antibiotics Given (last 72 hours)    Date/Time Action Medication Dose Rate   02/20/18 2142 New Bag/Given   meropenem (MERREM) 1 g in sodium chloride 0.9 % 100 mL IVPB 1 g 200 mL/hr   02/20/18 2231 New Bag/Given   vancomycin (VANCOCIN) IVPB 1000 mg/200 mL premix 1,000 mg 200 mL/hr   02/21/18 0501 New Bag/Given   vancomycin (VANCOCIN) IVPB 1000 mg/200 mL premix 1,000 mg 200 mL/hr   02/21/18 0615 New  Bag/Given   meropenem (MERREM) 1 g in sodium chloride 0.9 % 100 mL IVPB 1 g 200 mL/hr      MEDICATIONS: . aspirin EC  81 mg Oral Daily  . atorvastatin  20 mg Oral q1800  . docusate sodium  100 mg Oral BID  . insulin aspart  0-5 Units Subcutaneous QHS  . insulin aspart  0-9 Units Subcutaneous TID WC  . insulin glargine  12 Units Subcutaneous Daily  . levothyroxine  25 mcg Oral QAC breakfast  . lisinopril  40 mg Oral Daily    Review of Systems - 11 systems reviewed and negative per HPI   OBJECTIVE: Temp:  [98.3 F (36.8 C)-98.4 F (36.9 C)] 98.3 F (36.8 C) (04/18 0551) Pulse Rate:  [90-122] 122 (04/18 1024) Resp:  [18] 18 (04/17 1850) BP: (130-206)/(89-131) 181/105 (04/18 1024) SpO2:  [98 %-100 %] 100 % (04/18 1024) Weight:  [82.7 kg (182 lb 6.4 oz)] 82.7 kg (182 lb 6.4 oz) (04/17 1215) Physical Exam  Constitutional:  oriented to person, place, and time. appears well-developed and well-nourished. No distress.  HENT: [place]/AT, PERRLA, no scleral icterus L neck with swelling and mild ttp  Mouth/Throat: Oropharynx with poor dentition .  Cardiovascular: Normal rate, regular rhythm and normal heart sound1/6 sm Pulmonary/Chest: Effort normal and breath sounds normal.  Neck =LAN on L ttp  no nuchal rigidity Abdominal: Soft. Bowel sounds are normal.  exhibits no distension. There is no tenderness.  Lymphadenopathy: L cervical LAN  Neurological: alert and oriented to person, place, and time.  Skin: Skin is warm and dry. No rash noted. No erythema.  Psychiatric: a normal mood and affect.  behavior is normal.    LABS: Results for orders placed or performed during the hospital encounter of 02/20/18 (from the past 48 hour(s))  Basic metabolic panel     Status: Abnormal   Collection Time: 02/20/18 12:30 PM  Result Value Ref Range   Sodium 132 (L) 135 - 145 mmol/L   Potassium 3.7 3.5 - 5.1 mmol/L   Chloride 100 (L) 101 - 111 mmol/L   CO2 20 (L) 22 - 32 mmol/L   Glucose, Bld 378  (H) 65 - 99 mg/dL   BUN 11 6 - 20 mg/dL   Creatinine, Ser 0.64 0.44 - 1.00 mg/dL   Calcium 10.2 8.9 - 10.3 mg/dL   GFR calc non Af Amer >60 >60 mL/min   GFR calc Af Amer >60 >60 mL/min    Comment: (NOTE) The eGFR has been calculated using the CKD EPI equation. This calculation has not been validated in all clinical situations. eGFR's persistently <60 mL/min signify possible Chronic Kidney Disease.    Anion gap 12 5 - 15    Comment: Performed at [facility], [address] [place], [place] [address]  CBC     Status: Abnormal   Collection Time: 02/20/18 12:30 PM  Result Value Ref Range   WBC 14.2 (H) 3.6 - 11.0 K/uL   RBC 4.75 3.80 - 5.20 MIL/uL   Hemoglobin 14.3 12.0 - 16.0 g/dL   HCT 41.9 35.0 - 47.0 %   MCV 88.3 80.0 - 100.0 fL   MCH 30.2 26.0 - 34.0 pg   MCHC 34.2 32.0 - 36.0 g/dL   RDW 12.4 11.5 - 14.5 %   Platelets 336 150 - 440 K/uL    Comment: Performed at [facility], [address] [place], [place] [address]  Troponin I     Status: None   Collection Time: 02/20/18 12:30 PM  Result Value Ref Range   Troponin I <0.03 <0.03 ng/mL    Comment: Performed at [facility], [address] [place], [place] [address]  Troponin I     Status: Abnormal   Collection Time: 02/20/18  3:33 PM  Result Value Ref Range   Troponin I 0.18 (HH) <0.03 ng/mL    Comment: CRITICAL RESULT CALLED TO, READ BACK BY AND VERIFIED WITH KELLY PENDLETON @ 9147 ON 02/20/2018 BY CAF Performed at [facility], [address] [place], [place] [address]   TSH     Status: Abnormal   Collection Time: 02/20/18  5:15 PM  Result Value Ref Range   TSH 5.464 (H) 0.350 - 4.500 uIU/mL    Comment: Performed by a 3rd Generation assay with a functional sensitivity of <=0.01 uIU/mL. Performed at [facility], [address] [place], [place] [address]   Blood Culture (routine x 2)     Status: None (Preliminary result)   Collection Time: 02/20/18  5:23 PM   Result Value Ref Range   Specimen Description BLOOD RIGHT AC    Special Requests      BOTTLES DRAWN AEROBIC AND ANAEROBIC Blood Culture adequate volume   Culture      NO GROWTH < 24 HOURS Performed at [facility], [address] [place], [place] [address]    Report Status PENDING   Blood Culture (routine x 2)     Status: None (Preliminary result)   Collection Time: 02/20/18  5:28 PM  Result Value Ref Range   Specimen Description BLOOD LEFT AC    Special Requests      BOTTLES DRAWN AEROBIC AND ANAEROBIC Blood Culture adequate volume   Culture      NO GROWTH < 24 HOURS Performed at [facility], [address] [place], [place] [address]    Report Status PENDING   APTT     Status: Abnormal   Collection Time: 02/20/18  5:37 PM  Result Value Ref Range   aPTT 37 (H) 24 - 36 seconds    Comment:        IF BASELINE aPTT IS ELEVATED, SUGGEST PATIENT RISK ASSESSMENT BE USED TO DETERMINE APPROPRIATE ANTICOAGULANT THERAPY. Performed at [facility], [address] [place], [place] [address]   Protime-INR     Status: None   Collection Time: 02/20/18  5:37 PM  Result Value Ref Range   Prothrombin Time 14.1 11.4 - 15.2 seconds   INR 1.10     Comment: Performed at [facility], [address] [place], [place] [address]  Lactic acid, plasma     Status: None   Collection Time: 02/20/18  8:21 PM  Result Value Ref Range   Lactic Acid, Venous 1.2 0.5 - 1.9 mmol/L    Comment: Performed at [facility] [address] [place], [place] [address]  Procalcitonin - Baseline     Status: None   Collection Time: 02/20/18  8:21 PM  Result Value Ref Range   Procalcitonin 0.21 ng/mL    Comment:        Interpretation: PCT (Procalcitonin) <= 0.5 ng/mL: Systemic infection (sepsis) is not likely. Local bacterial infection is possible. (NOTE)       Sepsis PCT Algorithm           Lower Respiratory Tract                                       Infection PCT Algorithm    ----------------------------     ----------------------------         PCT < 0.25 ng/mL                PCT < 0.10 ng/mL         Strongly encourage             Strongly discourage   discontinuation of antibiotics    initiation of antibiotics    ----------------------------     -----------------------------       PCT 0.25 - 0.50 ng/mL            PCT 0.10 - 0.25 ng/mL               OR       >80% decrease in PCT            Discourage initiation of                                            antibiotics      Encourage discontinuation           of antibiotics    ----------------------------     -----------------------------  PCT >= 0.50 ng/mL              PCT 0.26 - 0.50 ng/mL               AND        <80% decrease in PCT             Encourage initiation of                                             antibiotics       Encourage continuation           of antibiotics    ----------------------------     -----------------------------        PCT >= 0.50 ng/mL                  PCT > 0.50 ng/mL               AND         increase in PCT                  Strongly encourage                                      initiation of antibiotics    Strongly encourage escalation           of antibiotics                                     -----------------------------                                           PCT <= 0.25 ng/mL                                                 OR                                        > 80% decrease in PCT                                     Discontinue / Do not initiate                                             antibiotics Performed at [facility], [address] [place], [place] [address]   Troponin I (q 6hr x 3)     Status: Abnormal   Collection Time: 02/20/18  8:21 PM  Result Value Ref Range   Troponin I 0.35 (HH) <0.03 ng/mL    Comment: CRITICAL VALUE NOTED. VALUE IS CONSISTENT WITH PREVIOUSLY REPORTED/CALLED VALUE BY  CAF Performed at  [facility], [place], [place] [address]   Heparin level (unfractionated)     Status: Abnormal   Collection Time: 02/20/18 10:39 PM  Result Value Ref Range   Heparin Unfractionated <0.10 (L) 0.30 - 0.70 IU/mL    Comment:        IF HEPARIN RESULTS ARE BELOW EXPECTED VALUES, AND PATIENT DOSAGE HAS BEEN CONFIRMED, SUGGEST FOLLOW UP TESTING OF ANTITHROMBIN III LEVELS. Performed at [facility], [address] [place], [place] [address]   Lactic acid, plasma     Status: None   Collection Time: 02/20/18 10:39 PM  Result Value Ref Range   Lactic Acid, Venous 1.1 0.5 - 1.9 mmol/L    Comment: Performed at [facility], [address] [place], [place] [address]  TSH     Status: Abnormal   Collection Time: 02/21/18  2:06 AM  Result Value Ref Range   TSH 17.867 (H) 0.350 - 4.500 uIU/mL    Comment: Performed by a 3rd Generation assay with a functional sensitivity of <=0.01 uIU/mL. Performed at [facility], [address] [place], [place] [address]   CBC     Status: Abnormal   Collection Time: 02/21/18  2:06 AM  Result Value Ref Range   WBC 14.1 (H) 3.6 - 11.0 K/uL   RBC 4.66 3.80 - 5.20 MIL/uL   Hemoglobin 14.0 12.0 - 16.0 g/dL   HCT 40.4 35.0 - 47.0 %   MCV 86.6 80.0 - 100.0 fL   MCH 30.0 26.0 - 34.0 pg   MCHC 34.6 32.0 - 36.0 g/dL   RDW 12.4 11.5 - 14.5 %   Platelets 340 150 - 440 K/uL    Comment: Performed at [facility], [address] [place], [place] [address]  Comprehensive metabolic panel     Status: Abnormal   Collection Time: 02/21/18  2:06 AM  Result Value Ref Range   Sodium 134 (L) 135 - 145 mmol/L   Potassium 3.7 3.5 - 5.1 mmol/L   Chloride 104 101 - 111 mmol/L   CO2 24 22 - 32 mmol/L   Glucose, Bld 276 (H) 65 - 99 mg/dL   BUN 9 6 - 20 mg/dL   Creatinine, Ser 0.49 0.44 - 1.00 mg/dL   Calcium 9.4 8.9 - 10.3 mg/dL   Total Protein 7.4 6.5 - 8.1 g/dL   Albumin 3.3 (L) 3.5 - 5.0  g/dL   AST 22 15 - 41 U/L   ALT 20 14 - 54 U/L   Alkaline Phosphatase 108 38 - 126 U/L   Total Bilirubin 0.4 0.3 - 1.2 mg/dL   GFR calc non Af Amer >60 >60 mL/min   GFR calc Af Amer >60 >60 mL/min    Comment: (NOTE) The eGFR has been calculated using the CKD EPI equation. This calculation has not been validated in all clinical situations. eGFR's persistently <60 mL/min signify possible Chronic Kidney Disease.    Anion gap 6 5 - 15    Comment: Performed at [facility], [address] [place], [place] [address]  Hemoglobin A1c     Status: Abnormal   Collection Time: 02/21/18  2:06 AM  Result Value Ref Range   Hgb A1c MFr Bld 10.6 (H) 4.8 - 5.6 %    Comment: (NOTE) Pre diabetes:          5.7%-6.4% Diabetes:              >6.4% Glycemic control for   <7.0%  adults with diabetes    Mean Plasma Glucose 257.52 mg/dL    Comment: Performed at [facility] [address] [place], [place] [address]  T4, free     Status: None   Collection Time: 02/21/18  2:06 AM  Result Value Ref Range   Free T4 0.72 0.61 - 1.12 ng/dL    Comment: (NOTE) Biotin ingestion may interfere with free T4 tests. If the results are inconsistent with the TSH level, previous test results, or the clinical presentation, then consider biotin interference. If needed, order repeat testing after stopping biotin. Performed at [facility], [address] [place], [place] [address]   Procalcitonin     Status: None   Collection Time: 02/21/18  2:06 AM  Result Value Ref Range   Procalcitonin 0.14 ng/mL    Comment:        Interpretation: PCT (Procalcitonin) <= 0.5 ng/mL: Systemic infection (sepsis) is not likely. Local bacterial infection is possible. (NOTE)       Sepsis PCT Algorithm           Lower Respiratory Tract                                      Infection PCT Algorithm    ----------------------------     ----------------------------         PCT < 0.25 ng/mL                PCT <  0.10 ng/mL         Strongly encourage             Strongly discourage   discontinuation of antibiotics    initiation of antibiotics    ----------------------------     -----------------------------       PCT 0.25 - 0.50 ng/mL            PCT 0.10 - 0.25 ng/mL               OR       >80% decrease in PCT            Discourage initiation of                                            antibiotics      Encourage discontinuation           of antibiotics    ----------------------------     -----------------------------         PCT >= 0.50 ng/mL              PCT 0.26 - 0.50 ng/mL               AND        <80% decrease in PCT             Encourage initiation of                                             antibiotics       Encourage continuation           of antibiotics    ----------------------------     -----------------------------  PCT >= 0.50 ng/mL                  PCT > 0.50 ng/mL               AND         increase in PCT                  Strongly encourage                                      initiation of antibiotics    Strongly encourage escalation           of antibiotics                                     -----------------------------                                           PCT <= 0.25 ng/mL                                                 OR                                        > 80% decrease in PCT                                     Discontinue / Do not initiate                                             antibiotics Performed at [facility], [address] [place], [place] [address]   Troponin I (q 6hr x 3)     Status: Abnormal   Collection Time: 02/21/18  2:06 AM  Result Value Ref Range   Troponin I 0.32 (HH) <0.03 ng/mL    Comment: CRITICAL VALUE NOTED. VALUE IS CONSISTENT WITH PREVIOUSLY REPORTED/CALLED VALUE...[facility] Performed at [facility], [place], [place] [address]   Heparin level (unfractionated)     Status: Abnormal    Collection Time: 02/21/18  7:00 AM  Result Value Ref Range   Heparin Unfractionated 0.16 (L) 0.30 - 0.70 IU/mL    Comment:        IF HEPARIN RESULTS ARE BELOW EXPECTED VALUES, AND PATIENT DOSAGE HAS BEEN CONFIRMED, SUGGEST FOLLOW UP TESTING OF ANTITHROMBIN III LEVELS. Performed at [facility], [address] [place], [place] [address]   Lactate dehydrogenase     Status: None   Collection Time: 02/21/18  7:00 AM  Result Value Ref Range   LDH 121 98 - 192 U/L    Comment: Performed at [facility], [address] [place], [place] [address]  Uric acid     Status: None  Collection Time: 02/21/18  7:00 AM  Result Value Ref Range   Uric Acid, Serum 4.3 2.3 - 6.6 mg/dL    Comment: Performed at [facility], [address], [place] [address]  Troponin I (q 6hr x 3)     Status: Abnormal   Collection Time: 02/21/18  7:53 AM  Result Value Ref Range   Troponin I 0.23 (HH) <0.03 ng/mL    Comment: CRITICAL VALUE NOTED. VALUE IS CONSISTENT WITH PREVIOUSLY REPORTED/CALLED VALUE DAS Performed at [facility], [address] [place], [place] [address]   Lipid panel     Status: Abnormal   Collection Time: 02/21/18  7:53 AM  Result Value Ref Range   Cholesterol 174 0 - 200 mg/dL   Triglycerides 324 (H) <150 mg/dL   HDL 26 (L) >40 mg/dL   Total CHOL/HDL Ratio 6.7 RATIO   VLDL 65 (H) 0 - 40 mg/dL   LDL Cholesterol 83 0 - 99 mg/dL    Comment:        Total Cholesterol/HDL:CHD Risk Coronary Heart Disease Risk Table                     Men   Women  1/2 Average Risk   3.4   3.3  Average Risk       5.0   4.4  2 X Average Risk   9.6   7.1  3 X Average Risk  23.4   11.0        Use the calculated Patient Ratio above and the CHD Risk Table to determine the patient's CHD Risk.        ATP III CLASSIFICATION (LDL):  <100     mg/dL   Optimal  100-129  mg/dL   Near or Above                    Optimal  130-159  mg/dL   Borderline  160-189   mg/dL   High  >190     mg/dL   Very High Performed at [facility], [address] [place], [place] [address]   CK     Status: None   Collection Time: 02/21/18  8:05 AM  Result Value Ref Range   Total CK 46 38 - 234 U/L    Comment: Performed at [facility], [address] [place], [place] [address]  Glucose, capillary     Status: Abnormal   Collection Time: 02/21/18  9:23 AM  Result Value Ref Range   Glucose-Capillary 284 (H) 65 - 99 mg/dL   No components found for: ESR, C REACTIVE PROTEIN MICRO: Recent Results (from the past 720 hour(s))  Blood Culture (routine x 2)     Status: None (Preliminary result)   Collection Time: 02/20/18  5:23 PM  Result Value Ref Range Status   Specimen Description BLOOD RIGHT [facility]  Final   Special Requests   Final    BOTTLES DRAWN AEROBIC AND ANAEROBIC Blood Culture adequate volume   Culture   Final    NO GROWTH < 24 HOURS Performed at [facility], [address] [place], [place] [address]    Report Status PENDING  Incomplete  Blood Culture (routine x 2)     Status: None (Preliminary result)   Collection Time: 02/20/18  5:28 PM  Result Value Ref Range Status   Specimen Description BLOOD LEFT [facility]  Final   Special Requests   Final  BOTTLES DRAWN AEROBIC AND ANAEROBIC Blood Culture adequate volume   Culture   Final    NO GROWTH < 24 HOURS Performed at [facility], [address] [place], [place] [address]    Report Status PENDING  Incomplete    IMAGING: Dg Chest 2 View  Result Date: 02/20/2018 CLINICAL DATA:  Chest pain. EXAM: CHEST - 2 VIEW COMPARISON:  Radiographs of December 27, 2006. FINDINGS: The heart size and mediastinal contours are within normal limits. Both lungs are clear. No pneumothorax or pleural effusion is noted. The visualized skeletal structures are unremarkable. IMPRESSION: No active cardiopulmonary disease. Electronically Signed   By: Marijo Conception, M.D.   On: 02/20/2018 12:55    Ct Soft Tissue Neck W Contrast  Result Date: 02/20/2018 CLINICAL DATA:  LEFT neck swelling for 5 days. EXAM: CT NECK WITH CONTRAST TECHNIQUE: Multidetector CT imaging of the neck was performed using the standard protocol following the bolus administration of intravenous contrast. CONTRAST:  63m OMNIPAQUE IOHEXOL 300 MG/ML  SOLN COMPARISON:  None. FINDINGS: Pharynx and larynx: No visible pharyngeal mass or mucosal irregularity. Normal larynx. Salivary glands: No inflammation, mass, or stone. Thyroid: Diffusely enlarged. Recommend further evaluation with thyroid ultrasound. If patient is clinically hyperthyroid, consider nuclear medicine thyroid uptake and scan. Lymph nodes: Necrotic LEFT level 2B node, 19 mm, image 53 series 2. Similarly necrotic LEFT level 3 node, 16 mm. Other smaller nodes, LEFT greater than RIGHT throughout its level II, IV, and V. The LEFT sternocleidomastoid is enlarged and edematous as well. Vascular: Thrombosed LEFT jugular vein inferiorly. Surrounding fluid, greater than would be expected for venous stasis, could represent phlegmon. This extends into the LEFT supraclavicular soft tissues. Atherosclerotic calcification of the carotid arteries, transverse arch, and great vessel origins. Limited intracranial: Negative. Visualized orbits: Negative Mastoids and visualized paranasal sinuses: Mild chronic sclerosis and dependent fluid. Skeleton: Spondylosis. Upper chest: Spiculated nodule RIGHT upper lobe, 6 x 7 mm. Other: None. IMPRESSION: Bulky LEFT neck adenopathy, centrally necrotic; most commonly, this appearance is associated with metastatic squamous cell carcinoma. No primary pharyngeal lesion is identified however. The degree of inflammatory change surrounding these nodes in the LEFT neck, with fluid and enlargement of the sternocleidomastoid, in the setting of elevated white count, suggest that they may possibly be inflammatory/infectious. Extranodal spread of tumor is not excluded.  Tissue sampling is warranted. Thyromegaly. Recommend further evaluation with thyroid ultrasound. If patient is clinically hyperthyroid, consider nuclear medicine thyroid uptake and scan. Thrombosed LEFT jugular vein. 6 x 7 mm RIGHT upper lobe spiculated nodule. 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 CT chest with contrast recommended for further evaluation. Electronically Signed   By: JStaci RighterM.D.   On: 02/20/2018 17:17   [place]  Result Date: 02/20/2018 CLINICAL DATA:  Incidental on CT.  Thyroid nodule on neck CT EXAM: THYROID ULTRASOUND TECHNIQUE: Ultrasound examination of the thyroid gland and adjacent soft tissues was performed. COMPARISON:  Neck CT - 02/20/2018 FINDINGS: Parenchymal Echotexture: Markedly heterogenous Isthmus: Enlarged measuring 0.2 cm in diameter Right lobe: Enlarged measuring 5.8 x 3.3 x 2.6 cm Left lobe: Enlarged measures 6.1 x 2.7 x 3.1 cm _________________________________________________________ Estimated total number of nodules >/= 1 cm: 1 Number of spongiform nodules >/=  2 cm not described below (TR1): 0 Number of mixed cystic and solid nodules >/= 1.5 cm not described below (TR2): 0 _________________________________________________________ QLoraine Mapleapproximately 1.2 x 1.0 x 0.8  and approximately 0.8 x 0.7 x 0.7 cm hyperechoic nodules within the right lobe of the thyroid are both favored to represent pseudo nodules given background heterogeneity of the thyroid gland, however regardless do not meet imaging criteria to recommend percutaneous sampling or continued dedicated follow-up. Redemonstrated hypoechoic likely cystic left-sided cervical lymph nodes with dominant lymph node measuring 1.7 cm in greatest short axis diameter (image 69). IMPRESSION: 1. Enlarged and markedly heterogeneous appearing thyroid gland without discrete worrisome thyroid nodule or mass. Findings  are nonspecific though could be seen in the setting of a thyroiditis. 2. Cystic, potentially suppurative or necrotic left cervical lymph nodes as demonstrated on preceding contrast-enhanced neck CT. The above is in keeping with the ACR TI-RADS recommendations - J Am Coll Radiol 2017;14:587-595. Electronically Signed   By: Sandi Mariscal M.D.   On: 02/20/2018 18:43    Assessment:   Pam Patel is a 57 y.o. female with a history of hypothyroidism and hypertension who was admitted with a month of tooth pain and then several days left neck swelling and pain as well as fevers.  She also reports recent dx of Strep throat (but no testing done) and was treated with abx - she thinks amox.  CT scan shows necrotic lymph nodes as well as thrombosed left jugular vein and thyromegaly.  Patient has been started on meropenem.  She is been seen by ENT.  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  Lemierres is most commonly caused by  oral flora especially fusobacterium.  Recommendation Cont meropenem due to PCN allergy. Await cxs from blood and aspirations Will need min 2 week IV therapy and 2-4 weeks oral   Thank you very much for allowing me to participate in the care of this patient. Please call with questions.   Cheral Marker. Ola Spurr, MD

## 2018-02-21 NOTE — Progress Notes (Signed)
MD notified. Pt has order for heparin gtt. Which was stopped prior to a procedure. MD acknowledged RN restarting heparin at 14.5. I will continue to assess.

## 2018-02-21 NOTE — Progress Notes (Signed)
ANTICOAGULATION CONSULT NOTE  Pharmacy Consult for heparin Indication: chest pain/ACS  Allergies  Allergen Reactions  . Penicillins Hives    Has patient had a PCN reaction causing immediate rash, facial/tongue/throat swelling, SOB or lightheadedness with hypotension: Unknown Has patient had a PCN reaction causing severe rash involving mucus membranes or skin necrosis: No Has patient had a PCN reaction that required hospitalization: No Has patient had a PCN reaction occurring within the last 10 years: No If all of the above answers are "NO", then may proceed with Cephalosporin use.   . Naproxen     REACTION: hives    Patient Measurements: Height: 5\' 6"  (167.6 cm) Weight: 182 lb 6.4 oz (82.7 kg) IBW/kg (Calculated) : 59.3 Heparin Dosing Weight: 76.7 kg  Vital Signs: Temp: 97.7 F (36.5 C) (04/18 1258) Temp Source: Oral (04/18 1258) BP: 117/84 (04/18 1258) Pulse Rate: 107 (04/18 1258)  Labs: Recent Labs    02/20/18 1230  02/20/18 1737 02/20/18 2021 02/20/18 2239 02/21/18 0206 02/21/18 0700 02/21/18 0753 02/21/18 0805  HGB 14.3  --   --   --   --  14.0  --   --   --   HCT 41.9  --   --   --   --  40.4  --   --   --   PLT 336  --   --   --   --  340  --   --   --   APTT  --   --  37*  --   --   --   --   --   --   LABPROT  --   --  14.1  --   --   --   --   --   --   INR  --   --  1.10  --   --   --   --   --   --   HEPARINUNFRC  --   --   --   --  <0.10*  --  0.16*  --   --   CREATININE 0.64  --   --   --   --  0.49  --   --   --   CKTOTAL  --   --   --   --   --   --   --   --  46  TROPONINI <0.03   < >  --  0.35*  --  0.32*  --  0.23*  --    < > = values in this interval not displayed.    Estimated Creatinine Clearance: 83.1 mL/min (by C-G formula based on SCr of 0.49 mg/dL).   Medical History: Past Medical History:  Diagnosis Date  . Hypertension     Medications:  Infusions:  . sodium chloride 125 mL/hr at 02/21/18 0505  . heparin Stopped (02/21/18  1047)  . meropenem (MERREM) IV 1 g (02/21/18 0615)    Assessment: 57 yof cc chest pain with PMH HTN. Troponin <0.03 -- 0.18 in ED. ECG with sinus tachycardia, possible LAE, and nonspecific ST abnormality. Pharmacy consulted to dose heparin for ACS. PTA list is not completed - will follow up when available.  appears only on ASA PTA  Goal of Therapy:  Heparin level 0.3-0.7 units/ml Monitor platelets by anticoagulation protocol: Yes   Plan:  4/17 17:37 baseline aPTT 37, INR 1.1. No PTA OAC noted on updated PTA med list. Continue as above.   4/17 2300 heparin  level <0.10. 2300 unit bolus and increase rate to 1200 units/hr. Recheck in 6 hours.  4/18 0700 Heparin level subtherapeutic at 0.16. Will bolus with 2300 units and increase infusion to 1450u/hr. Will recheck HL in 6 hours.   4/18 Heparin drip stopped for procedure this am. Heparin drip restarted ~1410 at 1450 units/hr. Will order HL in 6 hours.  Angelique BlonderMerrill,Kaitlynn Tramontana A, PharmD., BCPS Clinical Pharmacist 02/21/2018,2:04 PM

## 2018-02-21 NOTE — Progress Notes (Signed)
Pt transferred to room 250. Pts VSS, no complaints of pain. Telemetry applied. I will continue to assess.

## 2018-02-21 NOTE — Plan of Care (Addendum)
Report called to 2A s/w Larose Hiresammy Todd, RN.  Pharm notified that heparin drip was stopped at 1047 (Per Dr. Grace IsaacWatts in IR) and may need rate re-assessment once returns to 2A - room 250.  Per patient, she has only had sip with meds, since about 8pm 4/17. Patient leaving for IR for procedure, but consent was not signed (d/t no orders for consent). Patient A/O and able to sign once she gets to OR.   Telemetry notified  - patient transferring to 2A after procedure.

## 2018-02-21 NOTE — Consult Note (Signed)
[facility]  Date of admission:  02/20/2018  Inpatient day:  02/21/2018  Consulting physician:  Dr. Nicholes Mango.  Reason for Consultation:  Neck mass.  Chief Complaint: Pam Patel is a 57 y.o. female who was admitted through the emergency room with a left sided neck mass and jugular vein thrombosis.  HPI:  The patient describes a right sided ear infection and strept throat in 12/2017.  She had a fever.  She was prescribed antibiotics.  Symptoms resolved.  Approximately 2 weeks ago, her family noted some decreased neck mobility with head turning.   This week, she developed a low grade fever.  She had isolated chills.  She had brief pain in a right lower molar.  She took Tylenol around the clock on 02/17/2018.  She describes "sweats all of the time" (old symptom).  She has lost 20 pounds in the past year as she has cut back her oral intake and is cooking differently.  Appetite remains good.  She describes "not feeling good".  She had 1 episode of diarrhea this week.  She has had a sore throat.  She denies any change in voice. She has had chest pain and palpitations, which she states prompted evaluation in the ER.  She has a 30 pack year smoking history.  She stopped smoking 5 years ago.    She was seen in the ER.  CBC revealed a WBC of 14,200.  Soft tissue neck CT on 02/20/2018 revealed bulky LEFT neck adenopathy, centrally necrotic.  Metastatic squamous cell carcinoma is a consiideration. No primary pharyngeal lesion was identified. The degree of inflammatory change surrounding these nodes in the LEFT neck, with fluid and enlargement of the sternocleidomastoid, in the setting of elevated white count, suggests an inflammatory/infectious. Extranodal spread of tumor was not excluded.  Imaging revealed thyromegaly.  LEFT jugular vein was thrombosed.  There was a  6 x 7 mm RIGHT upper lobe spiculated nodule. 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 CT chest with contrast was recommended.  Thyroid ultrasound on 02/20/2018 revealed an enlarged and markedly heterogeneous appearing thyroid gland without thyroid nodule or mass.  She was started on heparin.  She is on meropenem and vancomycin.  Symptomatically, she notes "not feeling good".    Family history is notable for a maternal grandmother with throat cancer at age 67.    Past Medical History:  Diagnosis Date  . Hypertension     Past Surgical History:  Procedure Laterality Date  . APPENDECTOMY      History reviewed. No pertinent family history.  Social History:  reports that she has quit smoking. She has never used smokeless tobacco. She reports that she drank alcohol. She reports that she does not use drugs.  She smoked 1 pack/day x 30 years.  She stopped smoking 5 years ago.  She vapes.  She does not drink alcohol.  She does not work outside of the home.  She is accompanied by her husband, daughter, and son.  Allergies:  Allergies  Allergen Reactions  . Penicillins Hives    Has patient had a PCN reaction causing immediate rash, facial/tongue/throat swelling, SOB or lightheadedness with hypotension: Unknown Has patient had a PCN reaction causing severe rash involving mucus membranes or skin necrosis: No Has patient had a PCN reaction that required hospitalization: No Has patient had a PCN reaction occurring within the last 10 years: No If all of  the above answers are "NO", then may proceed with Cephalosporin use.   . Naproxen     REACTION: hives    Medications Prior to Admission  Medication Sig Dispense Refill  . acetaminophen (TYLENOL) 500 MG tablet Take 500 mg by mouth every 6 (six) hours as needed for mild pain.    Marland Kitchen aspirin EC 81 MG tablet Take 81 mg by mouth daily.      Review of Systems: GENERAL:  Feels "not good".  Low grade fever.  Chronic sweats.  Intentional weight loss of 20 pounds in the past  year. PERFORMANCE STATUS (ECOG):  1 HEENT:  Sore throat.  Dental issues (see HPI).  No visual changes, runny nose, mouth sores or tenderness. Lungs: No shortness of breath or cough.  No hemoptysis. Cardiac:  Chest pain and palpitations.  No orthopnea, or PND. GI:  Appetite normal.  Diarrhea x 1.  No nausea, vomiting, onstipation, melena or hematochezia. GU:  No urgency, frequency, dysuria, or hematuria. Musculoskeletal:  No back pain.  No joint pain.  No muscle tenderness. Extremities:  No pain or swelling. Skin:  No rashes or skin changes. Neuro:  Headache.  No numbness or weakness, balance or coordination issues. Endocrine:  No diabetes, thyroid issues, hot flashes or night sweats. Psych:  Anxiety.  No depression. Pain:  No focal pain. Review of systems:  All other systems reviewed and found to be negative.  Physical Exam:  Blood pressure (!) 183/98, pulse (!) 114, temperature 98.3 F (36.8 C), temperature source Oral, resp. rate 18, height '5\' 6"'$  (1.676 m), weight 182 lb 6.4 oz (82.7 kg), SpO2 100 %.  GENERAL:  Well developed, well nourished, woman sitting comfortably on the medical unit in no acute distress. MENTAL STATUS:  Alert and oriented to person, place and time. HEAD:  Normocephalic, atraumatic, face symmetric, no Cushingoid features. EYES:  Pupils equal round and reactive to light and accomodation.  No conjunctivitis or scleral icterus. ENT:  Slightly hoarse.  Oropharynx clear without lesion.  Tongue normal.  Poor dentition.  Mucous membranes moist.  RESPIRATORY:  Clear to auscultation without rales, wheezes or rhonchi. CARDIOVASCULAR:  Tachycardia.  Regular rate and rhythm without murmur, rub or gallop. ABDOMEN:  Soft, non-tender, with active bowel sounds, and no hepatosplenomegaly.  No masses. SKIN:  No rashes, ulcers or lesions. EXTREMITIES:  No evidence of endocarditis in fingertips or toes.  No edema, no skin discoloration or tenderness.  No palpable cords. LYMPH NODES:  Left sternocleidomastoid thickened/edematous with matted adenopathy, slightly tender to palpation.  No palpable supraclavicular, axillary or inguinal adenopathy  NEUROLOGICAL: Unremarkable. PSYCH:  Appropriate.   Results for orders placed or performed during the hospital encounter of 02/20/18 (from the past 48 hour(s))  Basic metabolic panel     Status: Abnormal   Collection Time: 02/20/18 12:30 PM  Result Value Ref Range   Sodium 132 (L) 135 - 145 mmol/L   Potassium 3.7 3.5 - 5.1 mmol/L   Chloride 100 (L) 101 - 111 mmol/L   CO2 20 (L) 22 - 32 mmol/L   Glucose, Bld 378 (H) 65 - 99 mg/dL   BUN 11 6 - 20 mg/dL   Creatinine, Ser 0.64 0.44 - 1.00 mg/dL   Calcium 10.2 8.9 - 10.3 mg/dL   GFR calc non Af Amer >60 >60 mL/min   GFR calc Af Amer >60 >60 mL/min    Comment: (NOTE) The eGFR has been calculated using the CKD EPI equation. This calculation has not been validated in  all clinical situations. eGFR's persistently <60 mL/min signify possible Chronic Kidney Disease.    Anion gap 12 5 - 15    Comment: Performed at [facility], [address] [place], [place] [address]  CBC     Status: Abnormal   Collection Time: 02/20/18 12:30 PM  Result Value Ref Range   WBC 14.2 (H) 3.6 - 11.0 K/uL   RBC 4.75 3.80 - 5.20 MIL/uL   Hemoglobin 14.3 12.0 - 16.0 g/dL   HCT 41.9 35.0 - 47.0 %   MCV 88.3 80.0 - 100.0 fL   MCH 30.2 26.0 - 34.0 pg   MCHC 34.2 32.0 - 36.0 g/dL   RDW 12.4 11.5 - 14.5 %   Platelets 336 150 - 440 K/uL    Comment: Performed at [facility], [address] [place], [place] [address]  Troponin I     Status: None   Collection Time: 02/20/18 12:30 PM  Result Value Ref Range   Troponin I <0.03 <0.03 ng/mL    Comment: Performed at [facility], [address] [place], [place] [address]  Troponin I     Status: Abnormal   Collection Time: 02/20/18  3:33 PM  Result Value Ref Range   Troponin I 0.18 (HH) <0.03 ng/mL    Comment: CRITICAL RESULT  CALLED TO, READ BACK BY AND VERIFIED WITH KELLY PENDLETON @ 3419 ON 02/20/2018 BY CAF Performed at [facility], [address] [place], [place] [address]   TSH     Status: Abnormal   Collection Time: 02/20/18  5:15 PM  Result Value Ref Range   TSH 5.464 (H) 0.350 - 4.500 uIU/mL    Comment: Performed by a 3rd Generation assay with a functional sensitivity of <=0.01 uIU/mL. Performed at [facility], [address] [place], [place] [address]   Blood Culture (routine x 2)     Status: None (Preliminary result)   Collection Time: 02/20/18  5:23 PM  Result Value Ref Range   Specimen Description BLOOD RIGHT AC    Special Requests      BOTTLES DRAWN AEROBIC AND ANAEROBIC Blood Culture adequate volume   Culture      NO GROWTH < 24 HOURS Performed at [facility], [address] [place], [place] [address]    Report Status PENDING   Blood Culture (routine x 2)     Status: None (Preliminary result)   Collection Time: 02/20/18  5:28 PM  Result Value Ref Range   Specimen Description BLOOD LEFT AC    Special Requests      BOTTLES DRAWN AEROBIC AND ANAEROBIC Blood Culture adequate volume   Culture      NO GROWTH < 24 HOURS Performed at [facility], [address], [place], [place] [address]    Report Status PENDING   APTT     Status: Abnormal   Collection Time: 02/20/18  5:37 PM  Result Value Ref Range   aPTT 37 (H) 24 - 36 seconds    Comment:        IF BASELINE aPTT IS ELEVATED, SUGGEST PATIENT RISK ASSESSMENT BE USED TO DETERMINE APPROPRIATE ANTICOAGULANT THERAPY. Performed at [facility], [address] [place], [place] [address]   Protime-INR     Status: None   Collection Time: 02/20/18  5:37 PM  Result Value Ref Range   Prothrombin Time 14.1 11.4 - 15.2 seconds   INR 1.10     Comment: Performed at [facility],  [place], [place] [address]  Lactic acid, plasma     Status: None   Collection Time:  02/20/18  8:21 PM  Result Value Ref Range   Lactic Acid, Venous 1.2 0.5 - 1.9 mmol/L    Comment: Performed at [facility], [address] [place], [place] [address]  Procalcitonin - Baseline     Status: None   Collection Time: 02/20/18  8:21 PM  Result Value Ref Range   Procalcitonin 0.21 ng/mL    Comment:        Interpretation: PCT (Procalcitonin) <= 0.5 ng/mL: Systemic infection (sepsis) is not likely. Local bacterial infection is possible. (NOTE)       Sepsis PCT Algorithm           Lower Respiratory Tract                                      Infection PCT Algorithm    ----------------------------     ----------------------------         PCT < 0.25 ng/mL                PCT < 0.10 ng/mL         Strongly encourage             Strongly discourage   discontinuation of antibiotics    initiation of antibiotics    ----------------------------     -----------------------------       PCT 0.25 - 0.50 ng/mL            PCT 0.10 - 0.25 ng/mL               OR       >80% decrease in PCT            Discourage initiation of                                            antibiotics      Encourage discontinuation           of antibiotics    ----------------------------     -----------------------------         PCT >= 0.50 ng/mL              PCT 0.26 - 0.50 ng/mL               AND        <80% decrease in PCT             Encourage initiation of                                             antibiotics       Encourage continuation           of antibiotics    ----------------------------     -----------------------------        PCT >= 0.50 ng/mL                  PCT > 0.50 ng/mL               AND         increase in  PCT                  Strongly encourage                                      initiation of antibiotics    Strongly encourage escalation           of antibiotics                                     -----------------------------                                           PCT <=  0.25 ng/mL                                                 OR                                        > 80% decrease in PCT                                     Discontinue / Do not initiate                                             antibiotics Performed at [facility], [address] [place], [place] [address]   Troponin I (q 6hr x 3)     Status: Abnormal   Collection Time: 02/20/18  8:21 PM  Result Value Ref Range   Troponin I 0.35 (HH) <0.03 ng/mL    Comment: CRITICAL VALUE NOTED. VALUE IS CONSISTENT WITH PREVIOUSLY REPORTED/CALLED VALUE BY CAF Performed at [facility], [place], [place] [address]   Heparin level (unfractionated)     Status: Abnormal   Collection Time: 02/20/18 10:39 PM  Result Value Ref Range   Heparin Unfractionated <0.10 (L) 0.30 - 0.70 IU/mL    Comment:        IF HEPARIN RESULTS ARE BELOW EXPECTED VALUES, AND PATIENT DOSAGE HAS BEEN CONFIRMED, SUGGEST FOLLOW UP TESTING OF ANTITHROMBIN III LEVELS. Performed at [facility], [address] [place], [place] [address]   Lactic acid, plasma     Status: None   Collection Time: 02/20/18 10:39 PM  Result Value Ref Range   Lactic Acid, Venous 1.1 0.5 - 1.9 mmol/L    Comment: Performed at [facility], [address] [place], [place] [address]  TSH     Status: Abnormal   Collection Time: 02/21/18  2:06 AM  Result Value Ref Range   TSH 17.867 (H) 0.350 - 4.500 uIU/mL    Comment: Performed by a 3rd Generation assay with a functional sensitivity of <=0.01 uIU/mL. Performed at [facility], [place], [place]  [address]   CBC     Status: Abnormal   Collection Time: 02/21/18  2:06 AM  Result Value Ref Range   WBC 14.1 (H) 3.6 - 11.0 K/uL   RBC 4.66 3.80 - 5.20 MIL/uL   Hemoglobin 14.0 12.0 - 16.0 g/dL   HCT 40.4 35.0 - 47.0 %   MCV 86.6 80.0 - 100.0 fL   MCH 30.0 26.0 - 34.0 pg   MCHC 34.6 32.0 - 36.0 g/dL   RDW 12.4 11.5 -  14.5 %   Platelets 340 150 - 440 K/uL    Comment: Performed at [facility], [address] [place], [place] [address]  Comprehensive metabolic panel     Status: Abnormal   Collection Time: 02/21/18  2:06 AM  Result Value Ref Range   Sodium 134 (L) 135 - 145 mmol/L   Potassium 3.7 3.5 - 5.1 mmol/L   Chloride 104 101 - 111 mmol/L   CO2 24 22 - 32 mmol/L   Glucose, Bld 276 (H) 65 - 99 mg/dL   BUN 9 6 - 20 mg/dL   Creatinine, Ser 0.49 0.44 - 1.00 mg/dL   Calcium 9.4 8.9 - 10.3 mg/dL   Total Protein 7.4 6.5 - 8.1 g/dL   Albumin 3.3 (L) 3.5 - 5.0 g/dL   AST 22 15 - 41 U/L   ALT 20 14 - 54 U/L   Alkaline Phosphatase 108 38 - 126 U/L   Total Bilirubin 0.4 0.3 - 1.2 mg/dL   GFR calc non Af Amer >60 >60 mL/min   GFR calc Af Amer >60 >60 mL/min    Comment: (NOTE) The eGFR has been calculated using the CKD EPI equation. This calculation has not been validated in all clinical situations. eGFR's persistently <60 mL/min signify possible Chronic Kidney Disease.    Anion gap 6 5 - 15    Comment: Performed at [facility], [address] [place], [place] [address]  Hemoglobin A1c     Status: Abnormal   Collection Time: 02/21/18  2:06 AM  Result Value Ref Range   Hgb A1c MFr Bld 10.6 (H) 4.8 - 5.6 %    Comment: (NOTE) Pre diabetes:          5.7%-6.4% Diabetes:              >6.4% Glycemic control for   <7.0% adults with diabetes    Mean Plasma Glucose 257.52 mg/dL    Comment: Performed at [facility] [address], [place], [place] [address]  T4, free     Status: None   Collection Time: 02/21/18  2:06 AM  Result Value Ref Range   Free T4 0.72 0.61 - 1.12 ng/dL    Comment: (NOTE) Biotin ingestion may interfere with free T4 tests. If the results are inconsistent with the TSH level, previous test results, or the clinical presentation, then consider biotin interference. If needed, order repeat testing after stopping biotin. Performed at [facility], [address] [place],  [place] [address]   Procalcitonin     Status: None   Collection Time: 02/21/18  2:06 AM  Result Value Ref Range   Procalcitonin 0.14 ng/mL    Comment:        Interpretation: PCT (Procalcitonin) <= 0.5 ng/mL: Systemic infection (sepsis) is not likely. Local bacterial infection is possible. (NOTE)       Sepsis PCT Algorithm           Lower Respiratory Tract  Infection PCT Algorithm    ----------------------------     ----------------------------         PCT < 0.25 ng/mL                PCT < 0.10 ng/mL         Strongly encourage             Strongly discourage   discontinuation of antibiotics    initiation of antibiotics    ----------------------------     -----------------------------       PCT 0.25 - 0.50 ng/mL            PCT 0.10 - 0.25 ng/mL               OR       >80% decrease in PCT            Discourage initiation of                                            antibiotics      Encourage discontinuation           of antibiotics    ----------------------------     -----------------------------         PCT >= 0.50 ng/mL              PCT 0.26 - 0.50 ng/mL               AND        <80% decrease in PCT             Encourage initiation of                                             antibiotics       Encourage continuation           of antibiotics    ----------------------------     -----------------------------        PCT >= 0.50 ng/mL                  PCT > 0.50 ng/mL               AND         increase in PCT                  Strongly encourage                                      initiation of antibiotics    Strongly encourage escalation           of antibiotics                                     -----------------------------                                           PCT <= 0.25 ng/mL  OR                                        > 80% decrease in PCT                                      Discontinue / Do not initiate                                             antibiotics Performed at [facility], [address] [place], [place] [address]   Troponin I (q 6hr x 3)     Status: Abnormal   Collection Time: 02/21/18  2:06 AM  Result Value Ref Range   Troponin I 0.32 (HH) <0.03 ng/mL    Comment: CRITICAL VALUE NOTED. VALUE IS CONSISTENT WITH PREVIOUSLY REPORTED/CALLED VALUE...[facility] Performed at [facility], [place], [place] [address]   Heparin level (unfractionated)     Status: Abnormal   Collection Time: 02/21/18  7:00 AM  Result Value Ref Range   Heparin Unfractionated 0.16 (L) 0.30 - 0.70 IU/mL    Comment:        IF HEPARIN RESULTS ARE BELOW EXPECTED VALUES, AND PATIENT DOSAGE HAS BEEN CONFIRMED, SUGGEST FOLLOW UP TESTING OF ANTITHROMBIN III LEVELS. Performed at [facility], [address] [place], [place] [address]   Lactate dehydrogenase     Status: None   Collection Time: 02/21/18  7:00 AM  Result Value Ref Range   LDH 121 98 - 192 U/L    Comment: Performed at [facility], [address] [place], [place] [address]  Uric acid     Status: None   Collection Time: 02/21/18  7:00 AM  Result Value Ref Range   Uric Acid, Serum 4.3 2.3 - 6.6 mg/dL    Comment: Performed at [facility], [place], [place] [address]  Troponin I (q 6hr x 3)     Status: Abnormal   Collection Time: 02/21/18  7:53 AM  Result Value Ref Range   Troponin I 0.23 (HH) <0.03 ng/mL    Comment: CRITICAL VALUE NOTED. VALUE IS CONSISTENT WITH PREVIOUSLY REPORTED/CALLED VALUE DAS Performed at [facility], [address] [place], [place] [address]   Lipid panel     Status: Abnormal   Collection Time: 02/21/18  7:53 AM  Result Value Ref Range   Cholesterol 174 0 - 200 mg/dL   Triglycerides 324 (H) <150 mg/dL   HDL 26 (L) >40 mg/dL   Total CHOL/HDL Ratio 6.7 RATIO   VLDL 65 (H)  0 - 40 mg/dL   LDL Cholesterol 83 0 - 99 mg/dL    Comment:        Total Cholesterol/HDL:CHD Risk Coronary Heart Disease Risk Table                     Men   Women  1/2 Average Risk   3.4   3.3  Average Risk       5.0   4.4  2 X Average Risk   9.6   7.1  3 X Average Risk  23.4   11.0  Use the calculated Patient Ratio above and the CHD Risk Table to determine the patient's CHD Risk.        ATP III CLASSIFICATION (LDL):  <100     mg/dL   Optimal  100-129  mg/dL   Near or Above                    Optimal  130-159  mg/dL   Borderline  160-189  mg/dL   High  >190     mg/dL   Very High Performed at [facility], [address] [place], [place] [address]    Dg Chest 2 View  Result Date: 02/20/2018 CLINICAL DATA:  Chest pain. EXAM: CHEST - 2 VIEW COMPARISON:  Radiographs of December 27, 2006. FINDINGS: The heart size and mediastinal contours are within normal limits. Both lungs are clear. No pneumothorax or pleural effusion is noted. The visualized skeletal structures are unremarkable. IMPRESSION: No active cardiopulmonary disease. Electronically Signed   By: Marijo Conception, M.D.   On: 02/20/2018 12:55   Ct Soft Tissue Neck W Contrast  Result Date: 02/20/2018 CLINICAL DATA:  LEFT neck swelling for 5 days. EXAM: CT NECK WITH CONTRAST TECHNIQUE: Multidetector CT imaging of the neck was performed using the standard protocol following the bolus administration of intravenous contrast. CONTRAST:  68m OMNIPAQUE IOHEXOL 300 MG/ML  SOLN COMPARISON:  None. FINDINGS: Pharynx and larynx: No visible pharyngeal mass or mucosal irregularity. Normal larynx. Salivary glands: No inflammation, mass, or stone. Thyroid: Diffusely enlarged. Recommend further evaluation with thyroid ultrasound. If patient is clinically hyperthyroid, consider nuclear medicine thyroid uptake and scan. Lymph nodes: Necrotic LEFT level 2B node, 19 mm, image 53 series 2. Similarly necrotic LEFT level 3 node, 16 mm.  Other smaller nodes, LEFT greater than RIGHT throughout its level II, IV, and V. The LEFT sternocleidomastoid is enlarged and edematous as well. Vascular: Thrombosed LEFT jugular vein inferiorly. Surrounding fluid, greater than would be expected for venous stasis, could represent phlegmon. This extends into the LEFT supraclavicular soft tissues. Atherosclerotic calcification of the carotid arteries, transverse arch, and great vessel origins. Limited intracranial: Negative. Visualized orbits: Negative Mastoids and visualized paranasal sinuses: Mild chronic sclerosis and dependent fluid. Skeleton: Spondylosis. Upper chest: Spiculated nodule RIGHT upper lobe, 6 x 7 mm. Other: None. IMPRESSION: Bulky LEFT neck adenopathy, centrally necrotic; most commonly, this appearance is associated with metastatic squamous cell carcinoma. No primary pharyngeal lesion is identified however. The degree of inflammatory change surrounding these nodes in the LEFT neck, with fluid and enlargement of the sternocleidomastoid, in the setting of elevated white count, suggest that they may possibly be inflammatory/infectious. Extranodal spread of tumor is not excluded. Tissue sampling is warranted. Thyromegaly. Recommend further evaluation with thyroid ultrasound. If patient is clinically hyperthyroid, consider nuclear medicine thyroid uptake and scan. Thrombosed LEFT jugular vein. 6 x 7 mm RIGHT upper lobe spiculated nodule. 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 CT chest with contrast recommended for further evaluation. Electronically Signed   By: JStaci RighterM.D.   On: 02/20/2018 17:17   UKoreaThyroid  Result Date: 02/20/2018 CLINICAL DATA:  Incidental on CT.  Thyroid nodule on neck CT EXAM: THYROID ULTRASOUND TECHNIQUE: Ultrasound examination of the thyroid gland and adjacent soft tissues was performed. COMPARISON:  Neck CT -  02/20/2018 FINDINGS: Parenchymal Echotexture: Markedly heterogenous Isthmus: Enlarged measuring 0.2 cm in diameter Right lobe: Enlarged measuring 5.8 x  3.3 x 2.6 cm Left lobe: Enlarged measures 6.1 x 2.7 x 3.1 cm _________________________________________________________ Estimated total number of nodules >/= 1 cm: 1 Number of spongiform nodules >/=  2 cm not described below (TR1): 0 Number of mixed cystic and solid nodules >/= 1.5 cm not described below ([place]): 0 _________________________________________________________ Loraine Maple approximately 1.2 x 1.0 x 0.8 and approximately 0.8 x 0.7 x 0.7 cm hyperechoic nodules within the right lobe of the thyroid are both favored to represent pseudo nodules given background heterogeneity of the thyroid gland, however regardless do not meet imaging criteria to recommend percutaneous sampling or continued dedicated follow-up. Redemonstrated hypoechoic likely cystic left-sided cervical lymph nodes with dominant lymph node measuring 1.7 cm in greatest short axis diameter (image 69). IMPRESSION: 1. Enlarged and markedly heterogeneous appearing thyroid gland without discrete worrisome thyroid nodule or mass. Findings are nonspecific though could be seen in the setting of a thyroiditis. 2. Cystic, potentially suppurative or necrotic left cervical lymph nodes as demonstrated on preceding contrast-enhanced neck CT. The above is in keeping with the ACR TI-RADS recommendations - J Am Coll Radiol 2017;14:587-595. Electronically Signed   By: Sandi Mariscal M.D.   On: 02/20/2018 18:43    Assessment:  The patient is a 57 y.o.  woman with rapidly enlarging left neck adenopathy and jugular vein thrombosis.  Given the rate of growth, suspect etiology is infectious in nature.    Soft tissue neck CT on 02/20/2018 revealed bulky LEFT neck adenopathy, centrally necrotic.  Metastatic squamous cell carcinoma is a consiideration. The degree of inflammatory change surrounding these nodes in the LEFT  neck, with fluid and enlargement of the sternocleidomastoid, in the setting of elevated white count, suggests an inflammatory/infectious.  LEFT jugular vein was thrombosed.  There was a  6 x 7 mm RIGHT upper lobe spiculated nodule. 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   Thyroid ultrasound on 02/20/2018 revealed an enlarged and markedly heterogeneous appearing thyroid gland without thyroid nodule or mass.  Symptomatically, she does not feel good.  She has had a low grade temperature and isolated chill.  Dentition is poor.  She describes 20 pounds of intentional weight loss.  Exam reveals no other adenopathy or hepatosplenomegaly.  Plan:   1.  Hematology:  Counts normal except for elevated WBC.  Check LDH and uric acid.  Patient on heparin for jugular vein thrombosis.     2.  Oncology:  Differential diagnosis includes squamous cell carcinoma and lymphoma.  Given rapidly enlarging necrotic neck nodes, suspect infectious etiology and less likely aggressive lymphoma.  Consider ENT consult.  Anticipate follow-up chest CT given 7 mm RUL nodule.  3.  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  Patient on meropenem and vancomycin.  Cultures negative to date.  ID consult pending.   Thank you for allowing me to participate in Pam Patel 's care.  I will follow her closely with you while hospitalized and after discharge in the outpatient department.  I will be out of the office beginning tomorrow.  Dr Janese Banks will be covering.   Lequita Asal, MD  02/21/2018, 8:50 AM

## 2018-02-21 NOTE — Consult Note (Signed)
Pam Patel, Pam Patel [id number] 03/20/1961 Pam Loll, MD  Reason for Consult: Neck mass  HPI: 57 y.o. Female with history of significant anxiety presented to ED with chest pain and feeling of racing heart.  She reports over the past 4 to 5 days she has had tooth pain and then progressive left neck swelling and pain.  CT scan shows Left sided IJ thrombosis and left sided necrotic lymph nodes.    Allergies:  Allergies  Allergen Reactions  . Penicillins Hives    Has patient had a PCN reaction causing immediate rash, facial/tongue/throat swelling, SOB or lightheadedness with hypotension: Unknown Has patient had a PCN reaction causing severe rash involving mucus membranes or skin necrosis: No Has patient had a PCN reaction that required hospitalization: No Has patient had a PCN reaction occurring within the last 10 years: No If all of the above answers are "NO", then may proceed with Cephalosporin use.   . Naproxen     REACTION: hives    ROS: Review of systems normal other than 12 systems except per HPI.  PMH:  Past Medical History:  Diagnosis Date  . Hypertension     FH: History reviewed. No pertinent family history.  SH:  Social History   Socioeconomic History  . Marital status: Married    Spouse name: Not on file  . Number of children: Not on file  . Years of education: Not on file  . Highest education level: Not on file  Occupational History  . Not on file  Social Needs  . Financial resource strain: Not on file  . Food insecurity:    Worry: Not on file    Inability: Not on file  . Transportation needs:    Medical: Not on file    Non-medical: Not on file  Tobacco Use  . Smoking status: Former Games developer  . Smokeless tobacco: Never Used  Substance and Sexual Activity  . Alcohol use: Not Currently  . Drug use: Never  . Sexual activity: Not on file  Lifestyle  . Physical activity:    Days per week: Not on file    Minutes per session: Not on file  . Stress: Not on  file  Relationships  . Social connections:    Talks on phone: Not on file    Gets together: Not on file    Attends religious service: Not on file    Active member of club or organization: Not on file    Attends meetings of clubs or organizations: Not on file    Relationship status: Not on file  . Intimate partner violence:    Fear of current or ex partner: Not on file    Emotionally abused: Not on file    Physically abused: Not on file    Forced sexual activity: Not on file  Other Topics Concern  . Not on file  Social History Narrative  . Not on file    PSH:  Past Surgical History:  Procedure Laterality Date  . APPENDECTOMY      Physical  Exam: \ GEN-  CN 2-12 grossly intact and symmetric. 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 Grace Patel with IR.  Order placed for US guided aspiration/biopsy of left necrotic lymph node.  Follow blood culture.  Recommend steroids unless contraindicated.  Also patient is extremely anxious and will require some anti-anxiety medications as she has a panic attack every time a physician talks with her.   Pam Patel 02/21/2018 10:15 AM

## 2018-02-21 NOTE — Progress Notes (Signed)
ANTICOAGULATION CONSULT NOTE - Initial Consult  Pharmacy Consult for heparin Indication: chest pain/ACS  Allergies  Allergen Reactions  . Penicillins Hives    Has patient had a PCN reaction causing immediate rash, facial/tongue/throat swelling, SOB or lightheadedness with hypotension: Unknown Has patient had a PCN reaction causing severe rash involving mucus membranes or skin necrosis: No Has patient had a PCN reaction that required hospitalization: No Has patient had a PCN reaction occurring within the last 10 years: No If all of the above answers are "NO", then may proceed with Cephalosporin use.   . Naproxen     REACTION: hives    Patient Measurements: Height: 5\' 6"  (167.6 cm) Weight: 182 lb 6.4 oz (82.7 kg) IBW/kg (Calculated) : 59.3 Heparin Dosing Weight: 76.7 kg  Vital Signs: Temp: 98.3 F (36.8 C) (04/18 0551) Temp Source: Oral (04/18 0551) BP: 197/97 (04/18 0551) Pulse Rate: 91 (04/18 0551)  Labs: Recent Labs    02/20/18 1230 02/20/18 1533 02/20/18 1737 02/20/18 2021 02/20/18 2239 02/21/18 0206 02/21/18 0700  HGB 14.3  --   --   --   --  14.0  --   HCT 41.9  --   --   --   --  40.4  --   PLT 336  --   --   --   --  340  --   APTT  --   --  37*  --   --   --   --   LABPROT  --   --  14.1  --   --   --   --   INR  --   --  1.10  --   --   --   --   HEPARINUNFRC  --   --   --   --  <0.10*  --  0.16*  CREATININE 0.64  --   --   --   --  0.49  --   TROPONINI <0.03 0.18*  --  0.35*  --  0.32*  --     Estimated Creatinine Clearance: 83.1 mL/min (by C-G formula based on SCr of 0.49 mg/dL).   Medical History: Past Medical History:  Diagnosis Date  . Hypertension     Medications:  Infusions:  . sodium chloride 125 mL/hr at 02/21/18 0505  . heparin 1,200 Units/hr (02/21/18 0052)  . meropenem (MERREM) IV 1 g (02/21/18 0615)  . vancomycin Stopped (02/21/18 0610)    Assessment: 57 yof cc chest pain with PMH HTN. Troponin <0.03 -- 0.18 in ED. ECG with  sinus tachycardia, possible LAE, and nonspecific ST abnormality. Pharmacy consulted to dose heparin for ACS. PTA list is not completed - will follow up when available.  Goal of Therapy:  Heparin level 0.3-0.7 units/ml Monitor platelets by anticoagulation protocol: Yes   Plan:  4/17 17:37 baseline aPTT 37, INR 1.1. No PTA OAC noted on updated PTA med list. Continue as above.   4/17 2300 heparin level <0.10. 2300 unit bolus and increase rate to 1200 units/hr. Recheck in 6 hours.  4/18 0700 Heparin level subtherapeutic at 0.16. Will bolus with 2300 units and increase infusion to 1450u/hr. Will recheck HL in 6 hours.   Gardner CandleSheema M Cuma Polyakov, PharmD., BCPS Clinical Pharmacist 02/21/2018,7:45 AM

## 2018-02-21 NOTE — Progress Notes (Signed)
ANTICOAGULATION CONSULT NOTE  Pharmacy Consult for heparin Indication: chest pain/ACS  Allergies  Allergen Reactions  . Penicillins Hives    Has patient had a PCN reaction causing immediate rash, facial/tongue/throat swelling, SOB or lightheadedness with hypotension: Unknown Has patient had a PCN reaction causing severe rash involving mucus membranes or skin necrosis: No Has patient had a PCN reaction that required hospitalization: No Has patient had a PCN reaction occurring within the last 10 years: No If all of the above answers are "NO", then may proceed with Cephalosporin use.   . Naproxen     REACTION: hives    Patient Measurements: Height: 5\' 6"  (167.6 cm) Weight: 182 lb 6.4 oz (82.7 kg) IBW/kg (Calculated) : 59.3 Heparin Dosing Weight: 76.7 kg  Vital Signs: Temp: 98.9 F (37.2 C) (04/18 1953) Temp Source: Oral (04/18 1953) BP: 158/79 (04/18 1953) Pulse Rate: 86 (04/18 1953)  Labs: Recent Labs    02/20/18 1230  02/20/18 1737 02/20/18 2021 02/20/18 2239 02/21/18 0206 02/21/18 0700 02/21/18 0753 02/21/18 0805 02/21/18 2032  HGB 14.3  --   --   --   --  14.0  --   --   --   --   HCT 41.9  --   --   --   --  40.4  --   --   --   --   PLT 336  --   --   --   --  340  --   --   --   --   APTT  --   --  37*  --   --   --   --   --   --   --   LABPROT  --   --  14.1  --   --   --   --   --   --   --   INR  --   --  1.10  --   --   --   --   --   --   --   HEPARINUNFRC  --   --   --   --  <0.10*  --  0.16*  --   --  0.13*  CREATININE 0.64  --   --   --   --  0.49  --   --   --   --   CKTOTAL  --   --   --   --   --   --   --   --  46  --   TROPONINI <0.03   < >  --  0.35*  --  0.32*  --  0.23*  --   --    < > = values in this interval not displayed.    Estimated Creatinine Clearance: 83.1 mL/min (by C-G formula based on SCr of 0.49 mg/dL).   Medical History: Past Medical History:  Diagnosis Date  . Hypertension     Medications:  Infusions:  . heparin  1,450 Units/hr (02/21/18 1411)  . meropenem [facility](MERREM) IV 1 g (02/21/18 2109)    Assessment: 57 yof cc chest pain with PMH HTN. Troponin <0.03 -- 0.18 in ED. ECG with sinus tachycardia, possible LAE, and nonspecific ST abnormality. Pharmacy consulted to dose heparin for ACS. PTA list is not completed - will follow up when available.  appears only on ASA PTA  Goal of Therapy:  Heparin level 0.3-0.7 units/ml Monitor platelets by anticoagulation protocol: Yes   Plan:  4/17 17:37  baseline aPTT 37, INR 1.1. No PTA OAC noted on updated PTA med list. Continue as above.   4/17 2300 heparin level <0.10. 2300 unit bolus and increase rate to 1200 units/hr. Recheck in 6 hours.  4/18 0700 Heparin level subtherapeutic at 0.16. Will bolus with 2300 units and increase infusion to 1450u/hr. Will recheck HL in 6 hours.   4/18 Heparin drip stopped for procedure this am. Heparin drip restarted ~1410 at 1450 units/hr. Will order HL in 6 hours.  4/19:  HL @ 20:30 = 0.13  Will order Heparin 2300 units IV X 1 bolus and increase drip rate to 1700 units/hr.  Will recheck HL 6 hrs after rate change.    Scherrie Gerlach, PharmD Clinical Pharmacist 02/21/2018,9:43 PM

## 2018-02-21 NOTE — Procedures (Signed)
Pre Procedure Dx: Left cervical LAN Post Procedural Dx: Same  Technically successful US guided aspiration and biopsy of left cervical LAN.  EBL: None  No immediate complications.   Katherina RightJay Demian Maisel, MD Pager #: [phone]

## 2018-02-21 NOTE — Progress Notes (Signed)
Pt complains of pain 8/10 to her neck and inability to get comfortable.  Pt with some shaking also.  Spoke with Dr Anne HahnWillis and received order for Morphine IVP. Pt also given ice pack for neck for comfort. Pam CombsSarah Mikela Senn RN

## 2018-02-21 NOTE — Progress Notes (Signed)
ANTICOAGULATION CONSULT NOTE - Initial Consult  Pharmacy Consult for heparin Indication: chest pain/ACS  Allergies  Allergen Reactions  . Penicillins Hives    Has patient had a PCN reaction causing immediate rash, facial/tongue/throat swelling, SOB or lightheadedness with hypotension: Unknown Has patient had a PCN reaction causing severe rash involving mucus membranes or skin necrosis: No Has patient had a PCN reaction that required hospitalization: No Has patient had a PCN reaction occurring within the last 10 years: No If all of the above answers are "NO", then may proceed with Cephalosporin use.   . Naproxen     REACTION: hives    Patient Measurements: Height: 5\' 6"  (167.6 cm) Weight: 182 lb 6.4 oz (82.7 kg) IBW/kg (Calculated) : 59.3 Heparin Dosing Weight: 76.7 kg  Vital Signs: Temp: 98.4 F (36.9 C) (04/17 1947) Temp Source: Oral (04/17 1947) BP: 159/96 (04/17 1947) Pulse Rate: 104 (04/17 1947)  Labs: Recent Labs    02/20/18 1230 02/20/18 1533 02/20/18 1737 02/20/18 2021 02/20/18 2239  HGB 14.3  --   --   --   --   HCT 41.9  --   --   --   --   PLT 336  --   --   --   --   APTT  --   --  37*  --   --   LABPROT  --   --  14.1  --   --   INR  --   --  1.10  --   --   HEPARINUNFRC  --   --   --   --  <0.10*  CREATININE 0.64  --   --   --   --   TROPONINI <0.03 0.18*  --  0.35*  --     Estimated Creatinine Clearance: 83.1 mL/min (by C-G formula based on SCr of 0.64 mg/dL).   Medical History: Past Medical History:  Diagnosis Date  . Hypertension     Medications:  Infusions:  . sodium chloride 125 mL/hr at 02/20/18 2142  . heparin 900 Units/hr (02/20/18 1917)  . meropenem (MERREM) IV Stopped (02/20/18 2217)  . vancomycin      Assessment: 57 yof cc chest pain with PMH HTN. Troponin <0.03 -- 0.18 in ED. ECG with sinus tachycardia, possible LAE, and nonspecific ST abnormality. Pharmacy consulted to dose heparin for ACS. PTA list is not completed - will  follow up when available.  Goal of Therapy:  Heparin level 0.3-0.7 units/ml Monitor platelets by anticoagulation protocol: Yes   Plan:  Give 4000 units bolus x 1 Start heparin infusion at 900 units/hr Check anti-Xa level in 6 hours and daily while on heparin Continue to monitor H&H and platelets   4/17 17:37 baseline aPTT 37, INR 1.1. No PTA OAC noted on updated PTA med list. Continue as above.   4/17 2300 heparin level <0.10. 2300 unit bolus and increase rate to 1200 units/hr. Recheck in 6 hours.  Erich MontaneMcBane,Elyse Prevo S, PharmD., BCPS Clinical Pharmacist 02/21/2018,12:49 AM

## 2018-02-21 NOTE — Progress Notes (Addendum)
Inpatient Diabetes Program Recommendations  AACE/ADA: New Consensus Statement on Inpatient Glycemic Control (2015)  Target Ranges:  Prepandial:   less than 140 mg/dL      Peak postprandial:   less than 180 mg/dL (1-2 hours)      Critically ill patients:  140 - 180 mg/dL   Lab Results  Component Value Date   GLUCAP 284 (H) 02/21/2018   HGBA1C 10.6 (H) 02/21/2018    Review of Glycemic Control  Diabetes history: No prior hx DM Current orders for Inpatient glycemic control: Lantus 12 units qd + Novolog sensitive correction scale + hs  Inpatient Diabetes Program Recommendations:    Received consult regarding new onset DM. May need increase to 0.2 units/kg x 82.7 kg = approx. 17 units qd Ordered Living Well With Diabetes book and dietician consult. Will plan to see patient @ bedside and discuss diabetes and will order insulin starter kit if patient will be started on insulin prior to discharge. Noted patient has no insurance listed, so [facility] Relion Novolin insulin 70/30 mix would be more affordable @ $25 per vial. 1:30 Spoke with pt @ bedside about new diagnosis. Discussed A1C results 10.6 (average blood glucose 258 over the past 2-3 months) and explained what an A1C , basic pathophysiology of DM Type 2, basic home care, basic diabetes diet nutrition principles, importance of checking CBGs and maintaining good CBG control to prevent long-term and short-term complications.  RNs to provide ongoing basic DM education at bedside with this patient. Have ordered educational booklet and placed RD consult for DM diet education for this patient.  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 Patient does not currently drink sugary drinks and eats moderate amount of carbohydrates.  Thank you, Pam Patel. Pam Inclan, RN, MSN, CDE  Diabetes Coordinator Inpatient Glycemic Control Team Team Pager [phone] (8am-5pm) 02/21/2018 11:21  AM

## 2018-02-22 ENCOUNTER — Inpatient Hospital Stay: Payer: Self-pay

## 2018-02-22 LAB — CBC WITH DIFFERENTIAL/PLATELET
Basophils Absolute: 0.2 K/uL — ABNORMAL HIGH (ref 0–0.1)
Basophils Relative: 2 %
Eosinophils Absolute: 0 K/uL (ref 0–0.7)
Eosinophils Relative: 0 %
HCT: 41.3 % (ref 35.0–47.0)
Hemoglobin: 14 g/dL (ref 12.0–16.0)
Lymphocytes Relative: 15 %
Lymphs Abs: 1.8 K/uL (ref 1.0–3.6)
MCH: 30 pg (ref 26.0–34.0)
MCHC: 34 g/dL (ref 32.0–36.0)
MCV: 88.2 fL (ref 80.0–100.0)
Monocytes Absolute: 0.2 K/uL (ref 0.2–0.9)
Monocytes Relative: 2 %
Neutro Abs: 9.6 K/uL — ABNORMAL HIGH (ref 1.4–6.5)
Neutrophils Relative %: 81 %
Platelets: 374 K/uL (ref 150–440)
RBC: 4.68 MIL/uL (ref 3.80–5.20)
RDW: 12.6 % (ref 11.5–14.5)
WBC: 11.7 K/uL — ABNORMAL HIGH (ref 3.6–11.0)

## 2018-02-22 LAB — BLOOD CULTURE ID PANEL (REFLEXED)
ACINETOBACTER BAUMANNII: NOT DETECTED
CANDIDA ALBICANS: NOT DETECTED
CANDIDA GLABRATA: NOT DETECTED
Candida krusei: NOT DETECTED
Candida parapsilosis: NOT DETECTED
Candida tropicalis: NOT DETECTED
ENTEROBACTER CLOACAE COMPLEX: NOT DETECTED
ENTEROBACTERIACEAE SPECIES: NOT DETECTED
ENTEROCOCCUS SPECIES: NOT DETECTED
Escherichia coli: NOT DETECTED
Haemophilus influenzae: NOT DETECTED
Klebsiella oxytoca: NOT DETECTED
Klebsiella pneumoniae: NOT DETECTED
LISTERIA MONOCYTOGENES: NOT DETECTED
NEISSERIA MENINGITIDIS: NOT DETECTED
PSEUDOMONAS AERUGINOSA: NOT DETECTED
Proteus species: NOT DETECTED
STAPHYLOCOCCUS SPECIES: NOT DETECTED
STREPTOCOCCUS AGALACTIAE: NOT DETECTED
STREPTOCOCCUS PNEUMONIAE: NOT DETECTED
Serratia marcescens: NOT DETECTED
Staphylococcus aureus (BCID): NOT DETECTED
Streptococcus pyogenes: NOT DETECTED
Streptococcus species: DETECTED — AB

## 2018-02-22 LAB — COMPREHENSIVE METABOLIC PANEL WITH GFR
ALT: 16 U/L (ref 14–54)
AST: 17 U/L (ref 15–41)
Albumin: 3.2 g/dL — ABNORMAL LOW (ref 3.5–5.0)
Alkaline Phosphatase: 101 U/L (ref 38–126)
Anion gap: 9 (ref 5–15)
BUN: 7 mg/dL (ref 6–20)
CO2: 22 mmol/L (ref 22–32)
Calcium: 10.1 mg/dL (ref 8.9–10.3)
Chloride: 105 mmol/L (ref 101–111)
Creatinine, Ser: 0.46 mg/dL (ref 0.44–1.00)
GFR calc Af Amer: >60 mL/min
GFR calc non Af Amer: >60 mL/min
Glucose, Bld: 259 mg/dL — ABNORMAL HIGH (ref 65–99)
Potassium: 4.1 mmol/L (ref 3.5–5.1)
Sodium: 136 mmol/L (ref 135–145)
Total Bilirubin: 0.5 mg/dL (ref 0.3–1.2)
Total Protein: 7.3 g/dL (ref 6.5–8.1)

## 2018-02-22 LAB — GLUCOSE, CAPILLARY
GLUCOSE-CAPILLARY: 217 mg/dL — AB (ref 65–99)
GLUCOSE-CAPILLARY: 286 mg/dL — AB (ref 65–99)
Glucose-Capillary: 231 mg/dL — ABNORMAL HIGH (ref 65–99)
Glucose-Capillary: 186 mg/dL — ABNORMAL HIGH (ref 65–99)

## 2018-02-22 LAB — HEPARIN LEVEL (UNFRACTIONATED)
HEPARIN UNFRACTIONATED: 0.31 [IU]/mL (ref 0.30–0.70)
Heparin Unfractionated: 0.26 [IU]/mL — ABNORMAL LOW (ref 0.30–0.70)
Heparin Unfractionated: 0.35 [IU]/mL (ref 0.30–0.70)

## 2018-02-22 LAB — HIV ANTIBODY (ROUTINE TESTING W REFLEX): HIV SCREEN 4TH GENERATION: NONREACTIVE

## 2018-02-22 LAB — PROCALCITONIN: PROCALCITONIN: 0.11 ng/mL

## 2018-02-22 LAB — T3: T3, Total: 98 ng/dL (ref 71–180)

## 2018-02-22 MED ORDER — METRONIDAZOLE 500 MG PO TABS
500.0000 mg | ORAL_TABLET | Freq: Three times a day (TID) | ORAL | Status: DC
  Administered 2018-02-22 – 2018-02-23 (×4): 500 mg via ORAL
  Filled 2018-02-22 (×5): qty 1

## 2018-02-22 MED ORDER — METOPROLOL TARTRATE 25 MG PO TABS
25.0000 mg | ORAL_TABLET | Freq: Two times a day (BID) | ORAL | Status: DC
  Administered 2018-02-22 (×2): 25 mg via ORAL
  Filled 2018-02-22 (×2): qty 1

## 2018-02-22 MED ORDER — INSULIN STARTER KIT- SYRINGES (ENGLISH)
1.0000 | Freq: Once | Status: AC
  Administered 2018-02-22: 1
  Filled 2018-02-22: qty 1

## 2018-02-22 MED ORDER — SODIUM CHLORIDE 0.9 % IV SOLN
2.0000 g | Freq: Every day | INTRAVENOUS | Status: DC
  Administered 2018-02-22 – 2018-02-23 (×2): 2 g via INTRAVENOUS
  Filled 2018-02-22 (×2): qty 20

## 2018-02-22 MED ORDER — SODIUM CHLORIDE 0.9% FLUSH: 10.0000 mL | INTRAVENOUS | Status: DC | PRN

## 2018-02-22 MED ORDER — HEPARIN BOLUS VIA INFUSION
1200.0000 [IU] | Freq: Once | INTRAVENOUS | Status: AC
  Administered 2018-02-22: 1200 [IU] via INTRAVENOUS
  Filled 2018-02-22: qty 1200

## 2018-02-22 MED ORDER — DEXAMETHASONE 4 MG PO TABS
2.0000 mg | ORAL_TABLET | Freq: Two times a day (BID) | ORAL | Status: DC
  Administered 2018-02-22 – 2018-02-23 (×2): 2 mg via ORAL
  Filled 2018-02-22 (×3): qty 0.5

## 2018-02-22 MED ORDER — PREMIER PROTEIN SHAKE
11.0000 [oz_av] | Freq: Two times a day (BID) | ORAL | Status: DC
  Administered 2018-02-22: 11 [oz_av] via ORAL

## 2018-02-22 MED ORDER — INSULIN ASPART PROT & ASPART (70-30 MIX) 100 UNIT/ML ~~LOC~~ SUSP
12.0000 [IU] | Freq: Two times a day (BID) | SUBCUTANEOUS | Status: DC
Start: 2018-02-22 — End: 2018-02-23
  Administered 2018-02-22 – 2018-02-23 (×2): 12 [IU] via SUBCUTANEOUS
  Filled 2018-02-22 (×2): qty 1

## 2018-02-22 MED ORDER — SODIUM CHLORIDE 0.9% FLUSH
10.0000 mL | Freq: Two times a day (BID) | INTRAVENOUS | Status: DC
  Administered 2018-02-22 – 2018-02-23 (×2): 10 mL

## 2018-02-22 NOTE — Progress Notes (Signed)
Infectious Disease Long Term IV Antibiotic Orders Pam Patel 12/01/1960  Diagnosis:  Lemierres syndrome  Culture results Pending   LABS Lab Results  Component Value Date   CREATININE 0.46 02/22/2018   Lab Results  Component Value Date   WBC 11.7 (H) 02/22/2018   HGB 14.0 02/22/2018   HCT 41.3 02/22/2018   MCV 88.2 02/22/2018   PLT 374 02/22/2018   No results found for: ESRSEDRATE, POCTSEDRATE No results found for: CRP  Allergies:  Allergies  Allergen Reactions  . Penicillins Hives    Has patient had a PCN reaction causing immediate rash, facial/tongue/throat swelling, SOB or lightheadedness with hypotension: Unknown Has patient had a PCN reaction causing severe rash involving mucus membranes or skin necrosis: No Has patient had a PCN reaction that required hospitalization: No Has patient had a PCN reaction occurring within the last 10 years: No If all of the above answers are "NO", then may proceed with Cephalosporin use.   . Naproxen     REACTION: hives    Discharge antibiotics Ceftriaxone 2 grams every               hours Oral flagyl 500 mg po q 8 hours  PICC Care per protocol Labs weekly while on IV antibiotics -FAX weekly labs to [phone] CBC w diff   Cr  LFT   Planned duration of antibiotics 2 weeks  Stop date  May 3  Follow up clinic date Prior to stop date   Mick Sellavid P Jonet Mathies, MD

## 2018-02-22 NOTE — Progress Notes (Signed)
ANTICOAGULATION CONSULT NOTE  Pharmacy Consult for heparin Indication: chest pain/ACS  Allergies  Allergen Reactions  . Penicillins Hives    Has patient had a PCN reaction causing immediate rash, facial/tongue/throat swelling, SOB or lightheadedness with hypotension: Unknown Has patient had a PCN reaction causing severe rash involving mucus membranes or skin necrosis: No Has patient had a PCN reaction that required hospitalization: No Has patient had a PCN reaction occurring within the last 10 years: No If all of the above answers are "NO", then may proceed with Cephalosporin use.   . Naproxen     REACTION: hives    Patient Measurements: Height: 5\' 6"  (167.6 cm) Weight: 182 lb (82.6 kg) IBW/kg (Calculated) : 59.3 Heparin Dosing Weight: 76.7 kg  Vital Signs: Temp: 97.7 F (36.5 C) (04/19 1619) Temp Source: Oral (04/19 1619) BP: 176/88 (04/19 1619) Pulse Rate: 78 (04/19 1619)  Labs: Recent Labs    02/20/18 1230  02/20/18 1737 02/20/18 2021  02/21/18 0206  02/21/18 0753 02/21/18 0805  02/22/18 0514 02/22/18 1235 02/22/18 1939  HGB 14.3  --   --   --   --  14.0  --   --   --   --  14.0  --   --   HCT 41.9  --   --   --   --  40.4  --   --   --   --  41.3  --   --   PLT 336  --   --   --   --  340  --   --   --   --  374  --   --   APTT  --   --  37*  --   --   --   --   --   --   --   --   --   --   LABPROT  --   --  14.1  --   --   --   --   --   --   --   --   --   --   INR  --   --  1.10  --   --   --   --   --   --   --   --   --   --   HEPARINUNFRC  --   --   --   --    < >  --    < >  --   --    < > 0.26* 0.35 0.31  CREATININE 0.64  --   --   --   --  0.49  --   --   --   --  0.46  --   --   CKTOTAL  --   --   --   --   --   --   --   --  46  --   --   --   --   TROPONINI <0.03   < >  --  0.35*  --  0.32*  --  0.23*  --   --   --   --   --    < > = values in this interval not displayed.    Estimated Creatinine Clearance: 83 mL/min (by C-G formula based on SCr  of 0.46 mg/dL).   Medical History: Past Medical History:  Diagnosis Date  . Hypertension     Medications:  Infusions:  . cefTRIAXone (  ROCEPHIN)  IV 2 g (02/22/18 1705)  . heparin 1,850 Units/hr (02/22/18 0720)    Assessment: 57 yof cc chest pain with PMH HTN. Troponin <0.03 -- 0.18 in ED. ECG with sinus tachycardia, possible LAE, and nonspecific ST abnormality. Pharmacy consulted to dose heparin for ACS. PTA list is not completed - will follow up when available.  appears only on ASA PTA  Goal of Therapy:  Heparin level 0.3-0.7 units/ml Monitor platelets by anticoagulation protocol: Yes   Plan:  4/17 17:37 baseline aPTT 37, INR 1.1. No PTA OAC noted on updated PTA med list. Continue as above.   4/17 2300 heparin level <0.10. 2300 unit bolus and increase rate to 1200 units/hr. Recheck in 6 hours.  4/18 0700 Heparin level subtherapeutic at 0.16. Will bolus with 2300 units and increase infusion to 1450u/hr. Will recheck HL in 6 hours.   4/18 Heparin drip stopped for procedure this am. Heparin drip restarted ~1410 at 1450 units/hr. Will order HL in 6 hours.  4/18:  HL @ 20:30 = 0.13  Will order Heparin 2300 units IV X 1 bolus and increase drip rate to 1700 units/hr.  Will recheck HL 6 hrs after rate change.   4/19 AM heparin level 0.26. 1200 unit bolus and increase rate to 1850 units/hr. Recheck in 6 hours.  4/19@1300  HL 0.35, will continue heparin iv 1850units/hr. Recheck in 6 hours   4/19:  HL @ 19:39 = 0.31  Will continue pt on current rate and recheck HL on 4/20 with AM labs.    Scherrie Gerlachobbins,Teigen Parslow D, PharmD Clinical Pharmacist 02/22/2018,8:37 PM

## 2018-02-22 NOTE — Progress Notes (Signed)
Inpatient Diabetes Program Recommendations  AACE/ADA: New Consensus Statement on Inpatient Glycemic Control (2015)  Target Ranges:  Prepandial:   less than 140 mg/dL      Peak postprandial:   less than 180 mg/dL (1-2 hours)      Critically ill patients:  140 - 180 mg/dL   Lab Results  Component Value Date   GLUCAP 217 (H) 02/22/2018   HGBA1C 10.6 (H) 02/21/2018    Review of Glycemic Control Results for Pam Patel, Pam Patel (MRN [id number]) as of 02/22/2018 08:20  Ref. Range 02/21/2018 09:23 02/21/2018 13:13 02/21/2018 16:44 02/21/2018 20:34 02/22/2018 07:44  Glucose-Capillary Latest Ref Range: 65 - 99 mg/dL 409284 (H) 811246 (H) 914201 (H) 200 (H) 217 (H)    Inpatient Diabetes Program Recommendations:   -Increase Lantus to 17. If patient is discharged home on insulin,  Consider Novolin Relion insulin 70/30 12 units bid (would be approx. 16.8 units basal + 7.2 units meal coverage) Spoke with RN Morrie SheldonAshley regarding insulin instruction if needed.  Thank you, Billy FischerJudy E. Analyah Mcconnon, RN, MSN, CDE  Diabetes Coordinator Inpatient Glycemic Control Team Team Pager [phone] (8am-5pm) 02/22/2018 8:25 AM

## 2018-02-22 NOTE — Consult Note (Signed)
[facility] Cardiology Consultation Note  Patient ID: Pam Patel, MRN: [id number], DOB/AGE: 58-Jun-1962 58 y.o. Admit date: 02/20/2018   Date of Consult: 02/22/2018 Primary Physician: Tower, Audrie Gallus, MD Primary Cardiologist: None  Chief Complaint:  Chief Complaint  Patient presents with  . Chest Pain   Reason for Consult: Elevated troponin  HPI: 57 y.o. female with previously undiagnosed malignant hypertension diabetes and hyperlipidemia for which the patient was apparently ignoring these concerns.  She does have a family history of cardiovascular disease and previous history of smoking for which we have noted.  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  She did have thrombus in the for which she has been placed on heparin.  Currently feels that she feels much better after treatment of her significant hypertension.  She has not had any episodes of shortness of breath chest pain weakness fatigue or and/or congestive heart failure type symptoms.  She had an echocardiogram showing normal LV systolic function and no evidence of significant valvular heart disease.  She has had a troponin that has slightly elevated with a peak of 0.32 more consistent with malignant hypertension and stress rather than acute coronary syndrome.  She has no further symptoms and does not require further cardiac intervention at this time  Past Medical History:  Diagnosis Date  . Hypertension       Surgical History:  Past Surgical History:  Procedure Laterality Date  . APPENDECTOMY       Home Meds: Prior to Admission medications   Medication Sig Start Date End Date Taking? Authorizing Provider  acetaminophen (TYLENOL) 500 MG tablet Take 500 mg by mouth every 6 (six) hours as needed for mild pain.   Yes [provider]  aspirin EC 81 MG tablet Take 81 mg by mouth daily.   Yes [provider]    Inpatient Medications:  . aspirin EC  81 mg Oral Daily  . atorvastatin  20 mg Oral q1800  . dexamethasone  4 mg Oral Q12H  . docusate sodium  100 mg Oral BID  . insulin aspart  0-5 Units Subcutaneous QHS  . insulin aspart  0-9 Units Subcutaneous TID WC  . insulin aspart protamine- aspart  12 Units Subcutaneous BID WC  . levothyroxine  25 mcg Oral QAC breakfast  . lisinopril  40 mg Oral Daily  . living well with diabetes book   Does not apply Once   . heparin 1,850 Units/hr (02/22/18 0720)  . meropenem (MERREM) IV Stopped (02/22/18 2956)    Allergies:  Allergies  Allergen Reactions  . Penicillins Hives    Has patient had a PCN reaction causing immediate rash, facial/tongue/throat swelling, SOB or lightheadedness with hypotension: Unknown Has patient had a PCN reaction causing severe rash involving mucus membranes or skin necrosis: No Has patient had a PCN reaction that required hospitalization: No Has patient had a PCN reaction occurring within the last 10 years: No If all of the above answers are "NO", then may proceed with Cephalosporin use.   . Naproxen     REACTION: hives    Social History   Socioeconomic History  . Marital status: Married    Spouse name: Not on file  . Number of children: Not on file  . Years of education: Not on file  . Highest education level: Not on file  Occupational History  . Not on file  Social Needs  . Financial resource strain: Not on file  . Food insecurity:    Worry: Not on file    Inability: Not on file  . Transportation needs:    Medical: Not on file    Non-medical: Not on file  Tobacco Use  . Smoking status: Former Games developermoker  . Smokeless tobacco: Never Used  Substance and Sexual Activity  . Alcohol use: Not Currently  . Drug use: Never  . Sexual activity: Not on file  Lifestyle  . Physical activity:    Days per week: Not on file    Minutes per session: Not on file  . Stress: Not on file  Relationships  . Social  connections:    Talks on phone: Not on file    Gets together: Not on file    Attends religious service: Not on file    Active member of club or organization: Not on file    Attends meetings of clubs or organizations: Not on file    Relationship status: Not on file  . Intimate partner violence:    Fear of current or ex partner: Not on file    Emotionally abused: Not on file    Physically abused: Not on file    Forced sexual activity: Not on file  Other Topics Concern  . Not on file  Social History Narrative  . Not on file     History reviewed. No pertinent family history.   Review of Systems Positive for neck pain Negative for: General:  chills, fever, night sweats or weight changes.  Cardiovascular: PND orthopnea syncope dizziness  Dermatological skin lesions rashes Respiratory: Cough congestion Urologic: Frequent urination urination at night and hematuria Abdominal: negative for nausea, vomiting, diarrhea, bright red blood per rectum, melena, or hematemesis Neurologic: negative for visual changes, and/or hearing changes  All other systems reviewed and are otherwise negative except as noted above.  Labs: Recent Labs    02/20/18 1533 02/20/18 2021 02/21/18 0206 02/21/18 0753 02/21/18 0805  CKTOTAL  --   --   --   --  46  TROPONINI 0.18* 0.35* 0.32* 0.23*  --    Lab Results  Component Value Date   WBC 11.7 (H) 02/22/2018   HGB 14.0 02/22/2018   HCT 41.3 02/22/2018   MCV 88.2 02/22/2018   PLT 374 02/22/2018    Recent Labs  Lab 02/22/18 0514  NA 136  K 4.1  CL 105  CO2 22  BUN 7  CREATININE 0.46  CALCIUM 10.1  PROT 7.3  BILITOT 0.5  ALKPHOS 101  ALT 16  AST 17  GLUCOSE 259*   Lab Results  Component Value Date   CHOL 174 02/21/2018   HDL 26 (L) 02/21/2018   LDLCALC 83 02/21/2018   TRIG 324 (H) 02/21/2018   No results found for: DDIMER  Radiology/Studies:  Dg Chest 2 View  Result Date: 02/20/2018 CLINICAL DATA:  Chest pain. EXAM: CHEST - 2  VIEW COMPARISON:  Radiographs of December 27, 2006. FINDINGS: The heart size and mediastinal contours are within normal limits. Both lungs are clear. No pneumothorax or pleural effusion is noted. The visualized skeletal structures are unremarkable. IMPRESSION: No active cardiopulmonary disease. Electronically Signed   By: Lupita RaiderJames  Green Jr, M.D.   On: 02/20/2018 12:55   Ct Soft Tissue Neck W Contrast  Result Date: 02/20/2018 CLINICAL DATA:  LEFT neck swelling for 5 days. EXAM: CT NECK WITH CONTRAST TECHNIQUE: Multidetector CT imaging of the neck was performed using the  standard protocol following the bolus administration of intravenous contrast. CONTRAST:  75mL OMNIPAQUE IOHEXOL 300 MG/ML  SOLN COMPARISON:  None. FINDINGS: Pharynx and larynx: No visible pharyngeal mass or mucosal irregularity. Normal larynx. Salivary glands: No inflammation, mass, or stone. Thyroid: Diffusely enlarged. Recommend further evaluation with thyroid ultrasound. If patient is clinically hyperthyroid, consider nuclear medicine thyroid uptake and scan. Lymph nodes: Necrotic LEFT level 2B node, 19 mm, image 53 series 2. Similarly necrotic LEFT level 3 node, 16 mm. Other smaller nodes, LEFT greater than RIGHT throughout its level II, IV, and V. The LEFT sternocleidomastoid is enlarged and edematous as well. Vascular: Thrombosed LEFT jugular vein inferiorly. Surrounding fluid, greater than would be expected for venous stasis, could represent phlegmon. This extends into the LEFT supraclavicular soft tissues. Atherosclerotic calcification of the carotid arteries, transverse arch, and great vessel origins. Limited intracranial: Negative. Visualized orbits: Negative Mastoids and visualized paranasal sinuses: Mild chronic sclerosis and dependent fluid. Skeleton: Spondylosis. Upper chest: Spiculated nodule RIGHT upper lobe, 6 x 7 mm. Other: None. IMPRESSION: Bulky LEFT neck adenopathy, centrally necrotic; most commonly, this appearance is  associated with metastatic squamous cell carcinoma. No primary pharyngeal lesion is identified however. The degree of inflammatory change surrounding these nodes in the LEFT neck, with fluid and enlargement of the sternocleidomastoid, in the setting of elevated white count, suggest that they may possibly be inflammatory/infectious. Extranodal spread of tumor is not excluded. Tissue sampling is warranted. Thyromegaly. Recommend further evaluation with thyroid ultrasound. If patient is clinically hyperthyroid, consider nuclear medicine thyroid uptake and scan. Thrombosed LEFT jugular vein. 6 x 7 mm RIGHT upper lobe spiculated nodule. 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 CT chest with contrast recommended for further evaluation. Electronically Signed   By: Elsie Stain M.D.   On: 02/20/2018 17:17   [place] Core Biopsy (lymph Nodes)  Result Date: 02/21/2018 INDICATION: Cystic/centrally necrotic left cervical lymphadenopathy. Please perform ultrasound-guided biopsy for tissue diagnostic purposes. EXAM: ULTRASOUND GUIDED LEFT CERVICAL LYMPH NODE ASPIRATION AND CORE NEEDLE BIOPSY COMPARISON:  Neck CT - 02/20/2018; thyroid ultrasound - 02/20/2018 MEDICATIONS: None ANESTHESIA/SEDATION: None COMPLICATIONS: None immediate. TECHNIQUE: Informed written consent was obtained from the patient after a discussion of the risks, benefits and alternatives to treatment. Questions regarding the procedure were encouraged and answered. Initial ultrasound scanning demonstrated an approximately 2.3 cm anechoic lymph node within the left mid neck correlating with the dominant left-sided cervical lymph node seen on preceding contrast-enhanced neck CT image 53, series 2. An ultrasound image was saved for documentation purposes. The procedure was planned. A timeout was performed prior to the initiation of the procedure. The operative was  prepped and draped in the usual sterile fashion, and a sterile drape was applied covering the operative field. A timeout was performed prior to the initiation of the procedure. Local anesthesia was provided with 1% lidocaine with epinephrine. Under direct ultrasound guidance, an 18 gauge needle was advanced into the central aspect of the anechoic lymph node. Despite appropriate needle positioning, only a trace (approximately 0.5 cc) of fluid was able to be aspirated from the lymph node. As such, the decision was made to proceed with ultrasound-guided core needle biopsy. Under direct ultrasound guidance, an 18 gauge core needle device was utilized to obtain to obtain 6 core needle biopsies of the anechoic left-sided cervical lymph node. The samples were placed in saline and submitted to pathology. The needle was removed and hemostasis was  achieved with manual compression. Post procedure scan was negative for significant hematoma. A dressing was placed. The patient tolerated the procedure well without immediate postprocedural complication. IMPRESSION: Technically successful ultrasound guided aspiration of approximately 0.5 cc of fluid from the dominant left-sided anechoic cervical lymph node followed by the acquisition of 6 core needle biopsy samples. Electronically Signed   By: Simonne Come M.D.   On: 02/21/2018 14:16   US Thyroid  Result Date: 02/20/2018 CLINICAL DATA:  Incidental on CT.  Thyroid nodule on neck CT EXAM: THYROID ULTRASOUND TECHNIQUE: Ultrasound examination of the thyroid gland and adjacent soft tissues was performed. COMPARISON:  Neck CT - 02/20/2018 FINDINGS: Parenchymal Echotexture: Markedly heterogenous Isthmus: Enlarged measuring 0.2 cm in diameter Right lobe: Enlarged measuring 5.8 x 3.3 x 2.6 cm Left lobe: Enlarged measures 6.1 x 2.7 x 3.1 cm _________________________________________________________ Estimated total number of nodules >/= 1 cm: 1 Number of spongiform nodules >/=  2 cm not  described below (TR1): 0 Number of mixed cystic and solid nodules >/= 1.5 cm not described below (TR2): 0 _________________________________________________________ Ronnald Nian approximately 1.2 x 1.0 x 0.8 and approximately 0.8 x 0.7 x 0.7 cm hyperechoic nodules within the right lobe of the thyroid are both favored to represent pseudo nodules given background heterogeneity of the thyroid gland, however regardless do not meet imaging criteria to recommend percutaneous sampling or continued dedicated follow-up. Redemonstrated hypoechoic likely cystic left-sided cervical lymph nodes with dominant lymph node measuring 1.7 cm in greatest short axis diameter (image 69). IMPRESSION: 1. Enlarged and markedly heterogeneous appearing thyroid gland without discrete worrisome thyroid nodule or mass. Findings are nonspecific though could be seen in the setting of a thyroiditis. 2. Cystic, potentially suppurative or necrotic left cervical lymph nodes as demonstrated on preceding contrast-enhanced neck CT. The above is in keeping with the ACR TI-RADS recommendations - J Am Coll Radiol 2017;14:587-595. Electronically Signed   By: Simonne Come M.D.   On: 02/20/2018 18:43   [place] Fna Soft Tissue  Result Date: 02/21/2018 INDICATION: Cystic/centrally necrotic left cervical lymphadenopathy. Please perform ultrasound-guided biopsy for tissue diagnostic purposes. EXAM: ULTRASOUND GUIDED LEFT CERVICAL LYMPH NODE ASPIRATION AND CORE NEEDLE BIOPSY COMPARISON:  Neck CT - 02/20/2018; thyroid ultrasound - 02/20/2018 MEDICATIONS: None ANESTHESIA/SEDATION: None COMPLICATIONS: None immediate. TECHNIQUE: Informed written consent was obtained from the patient after a discussion of the risks, benefits and alternatives to treatment. Questions regarding the procedure were encouraged and answered. Initial ultrasound scanning demonstrated an approximately 2.3 cm anechoic lymph node within the left mid neck correlating with the dominant left-sided cervical  lymph node seen on preceding contrast-enhanced neck CT image 53, series 2. An ultrasound image was saved for documentation purposes. The procedure was planned. A timeout was performed prior to the initiation of the procedure. The operative was prepped and draped in the usual sterile fashion, and a sterile drape was applied covering the operative field. A timeout was performed prior to the initiation of the procedure. Local anesthesia was provided with 1% lidocaine with epinephrine. Under direct ultrasound guidance, an 18 gauge needle was advanced into the central aspect of the anechoic lymph node. Despite appropriate needle positioning, only a trace (approximately 0.5 cc) of fluid was able to be aspirated from the lymph node. As such, the decision was made to proceed with ultrasound-guided core needle biopsy. Under direct ultrasound guidance, an 18 gauge core needle device was utilized to obtain to obtain 6 core needle biopsies of the anechoic left-sided cervical lymph node. The samples were placed in  saline and submitted to pathology. The needle was removed and hemostasis was achieved with manual compression. Post procedure scan was negative for significant hematoma. A dressing was placed. The patient tolerated the procedure well without immediate postprocedural complication. IMPRESSION: Technically successful ultrasound guided aspiration of approximately 0.5 cc of fluid from the dominant left-sided anechoic cervical lymph node followed by the acquisition of 6 core needle biopsy samples. Electronically Signed   By: Simonne Come M.D.   On: 02/21/2018 14:16    EKG: Normal sinus rhythm  Weights: Filed Weights   02/20/18 1215 02/22/18 0500  Weight: 182 lb 6.4 oz (82.7 kg) 182 lb (82.6 kg)     Physical Exam: Blood pressure (!) 148/102, pulse 88, temperature 97.7 F (36.5 C), temperature source Oral, resp. rate 19, height 5\' 6"  (1.676 m), weight 182 lb (82.6 kg), SpO2 97 %. Body mass index is 29.38  kg/m. General: Well developed, well nourished, in no acute distress. Head eyes ears nose throat: Normocephalic, atraumatic, sclera non-icteric, no xanthomas, nares are without discharge. No apparent thyromegaly but with slightly left-sided neck mass  Lungs: Normal respiratory effort.  no wheezes, no rales, no rhonchi.  Heart: RRR with normal S1 S2. no murmur gallop, no rub, PMI is normal size and placement, carotid upstroke normal without bruit, jugular venous pressure is normal Abdomen: Soft, non-tender, non-distended with normoactive bowel sounds. No hepatomegaly. No rebound/guarding. No obvious abdominal masses. Abdominal aorta is normal size without bruit Extremities: No edema. no cyanosis, no clubbing, no ulcers  Peripheral : 2+ bilateral upper extremity pulses, 2+ bilateral femoral pulses, 2+ bilateral dorsal pedal pulse Neuro: Alert and oriented. No facial asymmetry. No focal deficit. Moves all extremities spontaneously. Musculoskeletal: Normal muscle tone without kyphosis Psych:  Responds to questions appropriately with a normal affect.    Assessment: 57 year old female with malignant hypertension diabetes and hyperlipidemia with history of family cardiovascular disease and smoking now improved with elevated troponin more consistent with demand ischemia rather than acute coronary syndrome  Plan: 1.  Continue hypertension control with ACE inhibitor consideration of additional calcium channel blocker for goal systolic blood pressure below 540 mm 2.  Continue advancing treatment for diabetes 3.  High intensity cholesterol therapy with atorvastatin 4.  Aspirin for further risk reduction cardiovascular event 5.  No further cardiac diagnostic testing at this time and would ambulate and follow for any further significant symptoms.  If no further symptoms would be okay for discharge to home with outpatient follow-up and possible stress test  Signed, Lamar Blinks M.D. [facility] 02/22/2018, 9:45 AM

## 2018-02-22 NOTE — Progress Notes (Signed)
Initial Nutrition Assessment  DOCUMENTATION CODES:   Not applicable  INTERVENTION:   Premier Protein BID, each supplement provides 160 kcal and 30 grams of protein.   NUTRITION DIAGNOSIS:   Increased nutrient needs related to acute illness as evidenced by increased estimated needs.  GOAL:   Patient will meet greater than or equal to 90% of their needs  MONITOR:   PO intake, Supplement acceptance, Labs, I & O's, Weight trends  REASON FOR ASSESSMENT:   Consult Diet education  ASSESSMENT:   57 y.o. female with a known history of hypothyroidism, stopped taking Synthroid, essential hypertension is presenting to the ED with a chief complaint of chest pain and left-sided neck swelling.  Patient was reporting 1 month history of toothache and noticed left neck swelling 4-5 days ago with intermittent episodes of pain. 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  Pt reports poor appetite and po intake pta. Pt reports fair appetite and po intake since admit. She reported consuming 100% lunch and 25% dinner yesterday, and 50% breakfast this morning. Educated pt about her increased needs for protein and calories. Pt verbalized understanding. RD will order chocolate premier protein.  Pt reports no problems chewing or swallowing at this time.  Pt reports no nausea or vomiting. She reports having diarrhea 1x last week and that her last BM was on 02/19/18. Noted pt is on colace.   Pt reports 18 lb intentional wt loss over the past 8 months-1 year. Pt reports trying to watch her portion sizes, avoid sugar and salt, and buy reduced-fat products. She reports drinking diet soda and unsweetened tea.   RD was consulted for Diabetes diet education. This is a new diagnosis. Noted pt's A1c level of 10.6. RD will change diet order from heart healthy to carb modified. Talked to pt about her A1c level of 10.6. Pt provided a 24 hour dietary recall. Noted pt was often skipping lunch and snacking  after dinner. Educated pt on importance of keep carbohydrates consistent throughout the day. Provided "Nutrition and Type II Diabetes" handout from the Academy of [employer] and Dietetics. Discussed different food groups and their effects on blood sugar, emphasizing carbohydrate-containing foods. Provided list of carbohydrates and recommended serving sizes of common foods. Provided examples of ways to balance meals/snacks and encouraged intake of high-fiber, whole grain complex carbohydrates. Teach back method used.  Medications reviewed and include: aspirin, atorvastatin, dexamethasone, colace 100 mg, novolog, levothyroxine, lisinopril, metoprolol tartrate, metronidazole, ceftriaxone, heparin infusion  Labs reviewed: Glucose 378, 276, 259 x 48 hrs, Hgb A1c 10.6 (4/18), TSH 17.867(H) 4/18, K 4.1 wnl, Troponin I 0.23(H) 4/18, TG 324(H) 4/18, Neut# 9.6(H), Basophils Absolute 0.2(H)   NUTRITION - FOCUSED PHYSICAL EXAM:    Most Recent Value  Orbital Region  No depletion  Upper Arm Region  No depletion  Thoracic and Lumbar Region  Unable to assess  Buccal Region  No depletion  Temple Region  Mild depletion  Clavicle Bone Region  No depletion  Clavicle and Acromion Bone Region  No depletion  Scapular Bone Region  Unable to assess  Dorsal Hand  No depletion  Patellar Region  No depletion  Anterior Thigh Region  No depletion  Posterior Calf Region  No depletion  Edema (RD Assessment)  Unable to assess  Hair  Reviewed  Eyes  Reviewed  Mouth  Reviewed  Skin  Reviewed  Nails  Reviewed     Diet Order:  Diet Heart Room service appropriate? Yes; Fluid consistency: Thin  EDUCATION NEEDS:  Education needs have been addressed  Skin:  Skin Assessment: Reviewed RN Assessment  Last BM:  02/19/18- Noted pt was given colace(per patient report)  Height:   Ht Readings from Last 1 Encounters:  02/20/18 5\' 6"  (1.676 m)   Weight:   Wt Readings from Last 1 Encounters:  02/22/18 182 lb (82.6 kg)    Ideal Body Weight:  59 kg  BMI:  Body mass index is 29.38 kg/m.  Estimated Nutritional Needs:   Kcal:  1850-2150 (MSJ ABW x 1.3-1.5)  Protein:  82-99 g/day (ABW x 1.0-1.2)  Fluid:  <1.8 L/day   Clydene Pugh, MS Dietetic Intern [phone]

## 2018-02-22 NOTE — Progress Notes (Signed)
PHARMACY CONSULT NOTE FOR:  OUTPATIENT  PARENTERAL ANTIBIOTIC THERAPY (OPAT)  Indication: Indication:  Lemierre's syndrome neck abscess Regimen: ceftriaxone 2gm iv q24h x 14 days, metronidazole 500mg  po q8h x 14 days End date: 5/4  IV antibiotic discharge orders are pended. To discharging provider:  please sign these orders via discharge navigator,  Select New Orders & click on the button choice - Manage This Unsigned Work.     Thank you for allowing pharmacy to be a part of this patient's care.  Pam Patel  02/22/2018, 11:34 AM

## 2018-02-22 NOTE — Progress Notes (Signed)
PHARMACY - PHYSICIAN COMMUNICATION CRITICAL VALUE ALERT - BLOOD CULTURE IDENTIFICATION (BCID)  No results found for this or any previous visit.  Lab called to report GPC strep species in 1 of 4 bottles. KPC, MecA, Van A/B not detected.   Name of physician (or Provider) Contacted: Sudini x 1 at 15:29  Changes to prescribed antibiotics required: Patient is to remain on Rocephin - no change to current regimen.  Carola FrostNathan A Meighan Treto, Pharm.D., BCPS Clinical Pharmacist 02/22/2018  3:25 PM

## 2018-02-22 NOTE — Progress Notes (Signed)
ANTICOAGULATION CONSULT NOTE  Pharmacy Consult for heparin Indication: chest pain/ACS  Allergies  Allergen Reactions  . Penicillins Hives    Has patient had a PCN reaction causing immediate rash, facial/tongue/throat swelling, SOB or lightheadedness with hypotension: Unknown Has patient had a PCN reaction causing severe rash involving mucus membranes or skin necrosis: No Has patient had a PCN reaction that required hospitalization: No Has patient had a PCN reaction occurring within the last 10 years: No If all of the above answers are "NO", then may proceed with Cephalosporin use.   . Naproxen     REACTION: hives    Patient Measurements: Height: 5\' 6"  (167.6 cm) Weight: 182 lb (82.6 kg) IBW/kg (Calculated) : 59.3 Heparin Dosing Weight: 76.7 kg  Vital Signs: Temp: 97.7 F (36.5 C) (04/19 0741) Temp Source: Oral (04/19 0741) BP: 163/84 (04/19 1205) Pulse Rate: 75 (04/19 1205)  Labs: Recent Labs    02/20/18 1230  02/20/18 1737 02/20/18 2021  02/21/18 0206  02/21/18 0753 02/21/18 0805 02/21/18 2032 02/22/18 0514 02/22/18 1235  HGB 14.3  --   --   --   --  14.0  --   --   --   --  14.0  --   HCT 41.9  --   --   --   --  40.4  --   --   --   --  41.3  --   PLT 336  --   --   --   --  340  --   --   --   --  374  --   APTT  --   --  37*  --   --   --   --   --   --   --   --   --   LABPROT  --   --  14.1  --   --   --   --   --   --   --   --   --   INR  --   --  1.10  --   --   --   --   --   --   --   --   --   HEPARINUNFRC  --   --   --   --    < >  --    < >  --   --  0.13* 0.26* 0.35  CREATININE 0.64  --   --   --   --  0.49  --   --   --   --  0.46  --   CKTOTAL  --   --   --   --   --   --   --   --  46  --   --   --   TROPONINI <0.03   < >  --  0.35*  --  0.32*  --  0.23*  --   --   --   --    < > = values in this interval not displayed.    Estimated Creatinine Clearance: 83 mL/min (by C-G formula based on SCr of 0.46 mg/dL).   Medical History: Past  Medical History:  Diagnosis Date  . Hypertension     Medications:  Infusions:  . cefTRIAXone (ROCEPHIN)  IV    . heparin 1,850 Units/hr (02/22/18 0720)    Assessment: 57 yof cc chest pain with PMH HTN. Troponin <0.03 -- 0.18 in ED. ECG with  sinus tachycardia, possible LAE, and nonspecific ST abnormality. Pharmacy consulted to dose heparin for ACS. PTA list is not completed - will follow up when available.  appears only on ASA PTA  Goal of Therapy:  Heparin level 0.3-0.7 units/ml Monitor platelets by anticoagulation protocol: Yes   Plan:  4/17 17:37 baseline aPTT 37, INR 1.1. No PTA OAC noted on updated PTA med list. Continue as above.   4/17 2300 heparin level <0.10. 2300 unit bolus and increase rate to 1200 units/hr. Recheck in 6 hours.  4/18 0700 Heparin level subtherapeutic at 0.16. Will bolus with 2300 units and increase infusion to 1450u/hr. Will recheck HL in 6 hours.   4/18 Heparin drip stopped for procedure this am. Heparin drip restarted ~1410 at 1450 units/hr. Will order HL in 6 hours.  4/18:  HL @ 20:30 = 0.13  Will order Heparin 2300 units IV X 1 bolus and increase drip rate to 1700 units/hr.  Will recheck HL 6 hrs after rate change.   4/19 AM heparin level 0.26. 1200 unit bolus and increase rate to 1850 units/hr. Recheck in 6 hours.  4/19@1300  HL 0.35, will continue heparin iv 1850units/hr. Recheck in 6 hours    Luan Pulling, PharmD Clinical Pharmacist 02/22/2018,1:12 PM

## 2018-02-22 NOTE — Progress Notes (Signed)
Instructed patient on how to give herself insulin injections. Patient was receptive and willing to give her own injections for this mornings dose. Tolerated well and verbalized and demonstrated understanding.

## 2018-02-22 NOTE — Consult Note (Signed)
.. 02/22/2018 5:38 AM  Pam Patel [phone]   Temp:  [97.7 F (36.5 C)-98.9 F (37.2 C)] 98.9 F (37.2 C) (04/18 1953) Pulse Rate:  [86-122] 86 (04/18 1953) Resp:  [18] 18 (04/18 1953) BP: (117-206)/(79-131) 158/79 (04/18 1953) SpO2:  [95 %-100 %] 100 % (04/18 1953),     Intake/Output Summary (Last 24 hours) at 02/22/2018 0538 Last data filed at 02/22/2018 0200 Gross per 24 hour  Intake 660 ml  Output 0 ml  Net 660 ml    Results for orders placed or performed during the hospital encounter of 02/20/18 (from the past 24 hour(s))  Heparin level (unfractionated)     Status: Abnormal   Collection Time: 02/21/18  7:00 AM  Result Value Ref Range   Heparin Unfractionated 0.16 (L) 0.30 - 0.70 IU/mL  Lactate dehydrogenase     Status: None   Collection Time: 02/21/18  7:00 AM  Result Value Ref Range   LDH 121 98 - 192 U/L  Uric acid     Status: None   Collection Time: 02/21/18  7:00 AM  Result Value Ref Range   Uric Acid, Serum 4.3 2.3 - 6.6 mg/dL  Troponin I (q 6hr x 3)     Status: Abnormal   Collection Time: 02/21/18  7:53 AM  Result Value Ref Range   Troponin I 0.23 (HH) <0.03 ng/mL  Lipid panel     Status: Abnormal   Collection Time: 02/21/18  7:53 AM  Result Value Ref Range   Cholesterol 174 0 - 200 mg/dL   Triglycerides 409 (H) <150 mg/dL   HDL 26 (L) >81 mg/dL   Total CHOL/HDL Ratio 6.7 RATIO   VLDL 65 (H) 0 - 40 mg/dL   LDL Cholesterol 83 0 - 99 mg/dL  CK     Status: None   Collection Time: 02/21/18  8:05 AM  Result Value Ref Range   Total CK 46 38 - 234 U/L  Glucose, capillary     Status: Abnormal   Collection Time: 02/21/18  9:23 AM  Result Value Ref Range   Glucose-Capillary 284 (H) 65 - 99 mg/dL  Aerobic Culture (superficial specimen)     Status: None (Preliminary result)   Collection Time: 02/21/18 12:31 PM  Result Value Ref Range   Specimen Description      NEEDLE ASPIRATE Performed at [facility], [address] [place], [place]  [address]    Special Requests      NONE Performed at [facility], [address] [place], [place] [address]    Gram Stain      MODERATE WBC PRESENT, PREDOMINANTLY MONONUCLEAR NO ORGANISMS SEEN Performed at [facility], [address], [place], [place] [address]    Culture PENDING    Report Status PENDING   Aerobic/Anaerobic Culture (surgical/deep wound)     Status: None (Preliminary result)   Collection Time: 02/21/18 12:36 PM  Result Value Ref Range   Specimen Description TISSUE    Special Requests LEFT LYMPH NODE BX    Gram Stain      RARE WBC PRESENT, PREDOMINANTLY MONONUCLEAR NO ORGANISMS SEEN Performed at [facility], [address], [place], [place] [address]    Culture PENDING    Report Status PENDING   Glucose, capillary     Status: Abnormal   Collection Time: 02/21/18  1:13 PM  Result Value Ref Range   Glucose-Capillary 246 (H) 65 - 99 mg/dL  Glucose, capillary  Status: Abnormal   Collection Time: 02/21/18  4:44 PM  Result Value Ref Range   Glucose-Capillary 201 (H) 65 - 99 mg/dL  Heparin level (unfractionated)     Status: Abnormal   Collection Time: 02/21/18  8:32 PM  Result Value Ref Range   Heparin Unfractionated 0.13 (L) 0.30 - 0.70 IU/mL  Glucose, capillary     Status: Abnormal   Collection Time: 02/21/18  8:34 PM  Result Value Ref Range   Glucose-Capillary 200 (H) 65 - 99 mg/dL    SUBJECTIVE:  No acute events.  Reports slept well.  Improved pain per patient and able to move neck much easier.  Reports did not take any pain medications overnight.  OBJECTIVE:  GEN- NAD, less anxious this a.m. 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  Initial biopsy shows WBCs predominately monocuclear.  Continue anticoagulation and convert to PO per Medicine/vascular  protocol.  Will see patient back as outpatient given no current role for surgical intervention at this time and improvement with initial therapy.  Will follow pathology results.  Pam Patel 02/22/2018, 5:38 AM

## 2018-02-22 NOTE — Progress Notes (Signed)
ANTICOAGULATION CONSULT NOTE  Pharmacy Consult for heparin Indication: chest pain/ACS  Allergies  Allergen Reactions  . Penicillins Hives    Has patient had a PCN reaction causing immediate rash, facial/tongue/throat swelling, SOB or lightheadedness with hypotension: Unknown Has patient had a PCN reaction causing severe rash involving mucus membranes or skin necrosis: No Has patient had a PCN reaction that required hospitalization: No Has patient had a PCN reaction occurring within the last 10 years: No If all of the above answers are "NO", then may proceed with Cephalosporin use.   . Naproxen     REACTION: hives    Patient Measurements: Height: 5\' 6"  (167.6 cm) Weight: 182 lb (82.6 kg) IBW/kg (Calculated) : 59.3 Heparin Dosing Weight: 76.7 kg  Vital Signs: Temp: 98.3 F (36.8 C) (04/19 0500) Temp Source: Oral (04/19 0500) BP: 134/85 (04/19 0500) Pulse Rate: 91 (04/19 0500)  Labs: Recent Labs    02/20/18 1230  02/20/18 1737 02/20/18 2021  02/21/18 0206 02/21/18 0700 02/21/18 0753 02/21/18 0805 02/21/18 2032 02/22/18 0514  HGB 14.3  --   --   --   --  14.0  --   --   --   --  14.0  HCT 41.9  --   --   --   --  40.4  --   --   --   --  41.3  PLT 336  --   --   --   --  340  --   --   --   --  374  APTT  --   --  37*  --   --   --   --   --   --   --   --   LABPROT  --   --  14.1  --   --   --   --   --   --   --   --   INR  --   --  1.10  --   --   --   --   --   --   --   --   HEPARINUNFRC  --   --   --   --    < >  --  0.16*  --   --  0.13* 0.26*  CREATININE 0.64  --   --   --   --  0.49  --   --   --   --  0.46  CKTOTAL  --   --   --   --   --   --   --   --  46  --   --   TROPONINI <0.03   < >  --  0.35*  --  0.32*  --  0.23*  --   --   --    < > = values in this interval not displayed.    Estimated Creatinine Clearance: 83 mL/min (by C-G formula based on SCr of 0.46 mg/dL).   Medical History: Past Medical History:  Diagnosis Date  . Hypertension      Medications:  Infusions:  . heparin 1,700 Units/hr (02/22/18 0557)  . meropenem (MERREM) IV Stopped (02/22/18 0558)    Assessment: 57 yof cc chest pain with PMH HTN. Troponin <0.03 -- 0.18 in ED. ECG with sinus tachycardia, possible LAE, and nonspecific ST abnormality. Pharmacy consulted to dose heparin for ACS. PTA list is not completed - will follow up when available.  appears only on ASA PTA  Goal of Therapy:  Heparin level 0.3-0.7 units/ml Monitor platelets by anticoagulation protocol: Yes   Plan:  4/17 17:37 baseline aPTT 37, INR 1.1. No PTA OAC noted on updated PTA med list. Continue as above.   4/17 2300 heparin level <0.10. 2300 unit bolus and increase rate to 1200 units/hr. Recheck in 6 hours.  4/18 0700 Heparin level subtherapeutic at 0.16. Will bolus with 2300 units and increase infusion to 1450u/hr. Will recheck HL in 6 hours.   4/18 Heparin drip stopped for procedure this am. Heparin drip restarted ~1410 at 1450 units/hr. Will order HL in 6 hours.  4/18:  HL @ 20:30 = 0.13  Will order Heparin 2300 units IV X 1 bolus and increase drip rate to 1700 units/hr.  Will recheck HL 6 hrs after rate change.   4/19 AM heparin level 0.26. 1200 unit bolus and increase rate to 1850 units/hr. Recheck in 6 hours.   Erich MontaneMcBane,Brayson Livesey S, PharmD Clinical Pharmacist 02/22/2018,7:03 AM

## 2018-02-22 NOTE — Progress Notes (Signed)
[facility] - [place] [facility]   PATIENT NAME: Pam LennertWanda Scritchfield    MR#:  [id number]  DATE OF BIRTH:  04/19/1961  SUBJECTIVE:  CHIEF COMPLAINT:   Chief Complaint  Patient presents with  . Chest Pain   Neck pain and chest pain have resolved On heparin drip  REVIEW OF SYSTEMS:    Review of Systems  Constitutional: Positive for diaphoresis, malaise/fatigue and weight loss. Negative for chills and fever.  HENT: Negative for sore throat.   Eyes: Negative for blurred vision, double vision and pain.  Respiratory: Negative for cough, hemoptysis, shortness of breath and wheezing.   Cardiovascular: Positive for chest pain. Negative for palpitations, orthopnea and leg swelling.  Gastrointestinal: Positive for nausea. Negative for abdominal pain, constipation, diarrhea, heartburn and vomiting.  Genitourinary: Negative for dysuria and hematuria.  Musculoskeletal: Positive for neck pain. Negative for back pain and joint pain.  Skin: Negative for rash.  Neurological: Negative for sensory change, speech change, focal weakness and headaches.  Endo/Heme/Allergies: Does not bruise/bleed easily.  Psychiatric/Behavioral: Negative for depression. The patient is not nervous/anxious.     DRUG ALLERGIES:   Allergies  Allergen Reactions  . Penicillins Hives    Has patient had a PCN reaction causing immediate rash, facial/tongue/throat swelling, SOB or lightheadedness with hypotension: Unknown Has patient had a PCN reaction causing severe rash involving mucus membranes or skin necrosis: No Has patient had a PCN reaction that required hospitalization: No Has patient had a PCN reaction occurring within the last 10 years: No If all of the above answers are "NO", then may proceed with Cephalosporin use.   . Naproxen     REACTION: hives    VITALS:  Blood pressure (!) 148/102, pulse 88, temperature 97.7 F (36.5 C), temperature source Oral, resp. rate 19, height 5\' 6"  (1.676 m),  weight 82.6 kg (182 lb), SpO2 97 %.  PHYSICAL EXAMINATION:   Physical Exam  GENERAL:  57 y.o.-year-old patient lying in the bed with no acute distress.  EYES: Pupils equal, round, reactive to light and accommodation. No scleral icterus. Extraocular muscles intact.  HEENT: Head atraumatic, normocephalic. Oropharynx and nasopharynx clear.  NECK:  Supple, no jugular venous distention. No thyroid enlargement, no tenderness.  LUNGS: Normal breath sounds bilaterally, no wheezing, rales, rhonchi. No use of accessory muscles of respiration.  CARDIOVASCULAR: S1, S2 normal. No murmurs, rubs, or gallops.  ABDOMEN: Soft, nontender, nondistended. Bowel sounds present. No organomegaly or mass.  EXTREMITIES: No cyanosis, clubbing or edema b/l.    NEUROLOGIC: Cranial nerves II through XII are intact. No focal Motor or sensory deficits b/l.   PSYCHIATRIC: The patient is alert and oriented x 3.  Anxious SKIN: No obvious rash, lesion, or ulcer.   LABORATORY PANEL:   CBC Recent Labs  Lab 02/22/18 0514  WBC 11.7*  HGB 14.0  HCT 41.3  PLT 374   ------------------------------------------------------------------------------------------------------------------ Chemistries  Recent Labs  Lab 02/22/18 0514  NA 136  K 4.1  CL 105  CO2 22  GLUCOSE 259*  BUN 7  CREATININE 0.46  CALCIUM 10.1  AST 17  ALT 16  ALKPHOS 101  BILITOT 0.5   ------------------------------------------------------------------------------------------------------------------  Cardiac Enzymes Recent Labs  Lab 02/21/18 0753  TROPONINI 0.23*   ------------------------------------------------------------------------------------------------------------------  RADIOLOGY:  Dg Chest 2 View  Result Date: 02/20/2018 CLINICAL DATA:  Chest pain. EXAM: CHEST - 2 VIEW COMPARISON:  Radiographs of December 27, 2006. FINDINGS: The heart size and mediastinal contours are within normal limits. Both lungs are clear.  No pneumothorax or  pleural effusion is noted. The visualized skeletal structures are unremarkable. IMPRESSION: No active cardiopulmonary disease. Electronically Signed   By: Lupita Raider, M.D.   On: 02/20/2018 12:55   Ct Soft Tissue Neck W Contrast  Result Date: 02/20/2018 CLINICAL DATA:  LEFT neck swelling for 5 days. EXAM: CT NECK WITH CONTRAST TECHNIQUE: Multidetector CT imaging of the neck was performed using the standard protocol following the bolus administration of intravenous contrast. CONTRAST:  75mL OMNIPAQUE IOHEXOL 300 MG/ML  SOLN COMPARISON:  None. FINDINGS: Pharynx and larynx: No visible pharyngeal mass or mucosal irregularity. Normal larynx. Salivary glands: No inflammation, mass, or stone. Thyroid: Diffusely enlarged. Recommend further evaluation with thyroid ultrasound. If patient is clinically hyperthyroid, consider nuclear medicine thyroid uptake and scan. Lymph nodes: Necrotic LEFT level 2B node, 19 mm, image 53 series 2. Similarly necrotic LEFT level 3 node, 16 mm. Other smaller nodes, LEFT greater than RIGHT throughout its level II, IV, and V. The LEFT sternocleidomastoid is enlarged and edematous as well. Vascular: Thrombosed LEFT jugular vein inferiorly. Surrounding fluid, greater than would be expected for venous stasis, could represent phlegmon. This extends into the LEFT supraclavicular soft tissues. Atherosclerotic calcification of the carotid arteries, transverse arch, and great vessel origins. Limited intracranial: Negative. Visualized orbits: Negative Mastoids and visualized paranasal sinuses: Mild chronic sclerosis and dependent fluid. Skeleton: Spondylosis. Upper chest: Spiculated nodule RIGHT upper lobe, 6 x 7 mm. Other: None. IMPRESSION: Bulky LEFT neck adenopathy, centrally necrotic; most commonly, this appearance is associated with metastatic squamous cell carcinoma. No primary pharyngeal lesion is identified however. The degree of inflammatory change surrounding these nodes in the LEFT  neck, with fluid and enlargement of the sternocleidomastoid, in the setting of elevated white count, suggest that they may possibly be inflammatory/infectious. Extranodal spread of tumor is not excluded. Tissue sampling is warranted. Thyromegaly. Recommend further evaluation with thyroid ultrasound. If patient is clinically hyperthyroid, consider nuclear medicine thyroid uptake and scan. Thrombosed LEFT jugular vein. 6 x 7 mm RIGHT upper lobe spiculated nodule. 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 CT chest with contrast recommended for further evaluation. Electronically Signed   By: Elsie Stain M.D.   On: 02/20/2018 17:17   [place] Core Biopsy (lymph Nodes)  Result Date: 02/21/2018 INDICATION: Cystic/centrally necrotic left cervical lymphadenopathy. Please perform ultrasound-guided biopsy for tissue diagnostic purposes. EXAM: ULTRASOUND GUIDED LEFT CERVICAL LYMPH NODE ASPIRATION AND CORE NEEDLE BIOPSY COMPARISON:  Neck CT - 02/20/2018; thyroid ultrasound - 02/20/2018 MEDICATIONS: None ANESTHESIA/SEDATION: None COMPLICATIONS: None immediate. TECHNIQUE: Informed written consent was obtained from the patient after a discussion of the risks, benefits and alternatives to treatment. Questions regarding the procedure were encouraged and answered. Initial ultrasound scanning demonstrated an approximately 2.3 cm anechoic lymph node within the left mid neck correlating with the dominant left-sided cervical lymph node seen on preceding contrast-enhanced neck CT image 53, series 2. An ultrasound image was saved for documentation purposes. The procedure was planned. A timeout was performed prior to the initiation of the procedure. The operative was prepped and draped in the usual sterile fashion, and a sterile drape was applied covering the operative field. A timeout was performed prior to the initiation of the procedure.  Local anesthesia was provided with 1% lidocaine with epinephrine. Under direct ultrasound guidance, an 18 gauge needle was advanced into the central aspect of the anechoic lymph node. Despite appropriate needle positioning, only a trace (  approximately 0.5 cc) of fluid was able to be aspirated from the lymph node. As such, the decision was made to proceed with ultrasound-guided core needle biopsy. Under direct ultrasound guidance, an 18 gauge core needle device was utilized to obtain to obtain 6 core needle biopsies of the anechoic left-sided cervical lymph node. The samples were placed in saline and submitted to pathology. The needle was removed and hemostasis was achieved with manual compression. Post procedure scan was negative for significant hematoma. A dressing was placed. The patient tolerated the procedure well without immediate postprocedural complication. IMPRESSION: Technically successful ultrasound guided aspiration of approximately 0.5 cc of fluid from the dominant left-sided anechoic cervical lymph node followed by the acquisition of 6 core needle biopsy samples. Electronically Signed   By: Simonne Come M.D.   On: 02/21/2018 14:16   US Thyroid  Result Date: 02/20/2018 CLINICAL DATA:  Incidental on CT.  Thyroid nodule on neck CT EXAM: THYROID ULTRASOUND TECHNIQUE: Ultrasound examination of the thyroid gland and adjacent soft tissues was performed. COMPARISON:  Neck CT - 02/20/2018 FINDINGS: Parenchymal Echotexture: Markedly heterogenous Isthmus: Enlarged measuring 0.2 cm in diameter Right lobe: Enlarged measuring 5.8 x 3.3 x 2.6 cm Left lobe: Enlarged measures 6.1 x 2.7 x 3.1 cm _________________________________________________________ Estimated total number of nodules >/= 1 cm: 1 Number of spongiform nodules >/=  2 cm not described below (TR1): 0 Number of mixed cystic and solid nodules >/= 1.5 cm not described below (TR2): 0 _________________________________________________________ Ronnald Nian  approximately 1.2 x 1.0 x 0.8 and approximately 0.8 x 0.7 x 0.7 cm hyperechoic nodules within the right lobe of the thyroid are both favored to represent pseudo nodules given background heterogeneity of the thyroid gland, however regardless do not meet imaging criteria to recommend percutaneous sampling or continued dedicated follow-up. Redemonstrated hypoechoic likely cystic left-sided cervical lymph nodes with dominant lymph node measuring 1.7 cm in greatest short axis diameter (image 69). IMPRESSION: 1. Enlarged and markedly heterogeneous appearing thyroid gland without discrete worrisome thyroid nodule or mass. Findings are nonspecific though could be seen in the setting of a thyroiditis. 2. Cystic, potentially suppurative or necrotic left cervical lymph nodes as demonstrated on preceding contrast-enhanced neck CT. The above is in keeping with the ACR TI-RADS recommendations - J Am Coll Radiol 2017;14:587-595. Electronically Signed   By: Simonne Come M.D.   On: 02/20/2018 18:43   [place] Fna Soft Tissue  Result Date: 02/21/2018 INDICATION: Cystic/centrally necrotic left cervical lymphadenopathy. Please perform ultrasound-guided biopsy for tissue diagnostic purposes. EXAM: ULTRASOUND GUIDED LEFT CERVICAL LYMPH NODE ASPIRATION AND CORE NEEDLE BIOPSY COMPARISON:  Neck CT - 02/20/2018; thyroid ultrasound - 02/20/2018 MEDICATIONS: None ANESTHESIA/SEDATION: None COMPLICATIONS: None immediate. TECHNIQUE: Informed written consent was obtained from the patient after a discussion of the risks, benefits and alternatives to treatment. Questions regarding the procedure were encouraged and answered. Initial ultrasound scanning demonstrated an approximately 2.3 cm anechoic lymph node within the left mid neck correlating with the dominant left-sided cervical lymph node seen on preceding contrast-enhanced neck CT image 53, series 2. An ultrasound image was saved for documentation purposes. The procedure was planned. A timeout was  performed prior to the initiation of the procedure. The operative was prepped and draped in the usual sterile fashion, and a sterile drape was applied covering the operative field. A timeout was performed prior to the initiation of the procedure. Local anesthesia was provided with 1% lidocaine with epinephrine. Under direct ultrasound guidance, an 18 gauge needle was advanced into the central aspect  of the anechoic lymph node. Despite appropriate needle positioning, only a trace (approximately 0.5 cc) of fluid was able to be aspirated from the lymph node. As such, the decision was made to proceed with ultrasound-guided core needle biopsy. Under direct ultrasound guidance, an 18 gauge core needle device was utilized to obtain to obtain 6 core needle biopsies of the anechoic left-sided cervical lymph node. The samples were placed in saline and submitted to pathology. The needle was removed and hemostasis was achieved with manual compression. Post procedure scan was negative for significant hematoma. A dressing was placed. The patient tolerated the procedure well without immediate postprocedural complication. IMPRESSION: Technically successful ultrasound guided aspiration of approximately 0.5 cc of fluid from the dominant left-sided anechoic cervical lymph node followed by the acquisition of 6 core needle biopsy samples. Electronically Signed   By: Simonne Come M.D.   On: 02/21/2018 14:16   [place] Ekg Site Rite  Result Date: 02/22/2018 If Site Rite image not attached, placement could not be confirmed due to current cardiac rhythm.    ASSESSMENT AND PLAN:   * Elevated troponin.  Likely demand ischemia.  Echocardiogram with no wall motion abnormalities.  Discussed with Dr. Gwen Pounds of cardiology who will see the patient.  We will start aspirin.  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  Will need follow-up CT scan in 3-6 months as outpatient.  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  Vascular surgery consulted.  *Accelerated hypertension. Started lisinopril 40 mg daily.  Still uncontrolled.  Will add metoprolol today  *New onset diabetes mellitus. Patient received Lantus yesterday.  We will change to insulin 70/30 12 units twice daily. Sliding scale insulin  *DVT prophylaxis.  On heparin drip.  All the records are reviewed and case discussed with Care Management/Social Workerr. Management plans discussed with the patient, family and they are in agreement.  CODE STATUS: FULL CODE  DVT Prophylaxis: SCDs  TOTAL TIME TAKING CARE OF THIS PATIENT: 35 minutes.   POSSIBLE D/C IN 3-4 DAYS, DEPENDING ON CLINICAL CONDITION.  Orie Fisherman M.D on 02/22/2018 at 10:37 AM  Between 7am to 6pm - Pager - [phone]  After 6pm go to [email or web address] - password [email or web address] [facility]  [facility]  Office  [phone]  CC: Primary care physician; Tower, Audrie Gallus, MD  Note: This dictation was prepared with Dragon dictation along with smaller phrase technology. Any transcriptional errors that result from this process are unintentional.

## 2018-02-22 NOTE — Progress Notes (Signed)
[facility] Date of Admission:  02/20/2018     ID: [name] is a 57 y.o. female with neck abscess Active Problems:   Neck mass   Subjective: S/p IR aspiration and bxp yest. GS with mod WBC- predom monocytes, no organisms seen. Cx pending. BCX remains neg. No fevers and neck pain much improved but still swollen  ROS  Eleven systems are reviewed and negative except per hpi  Medications:  Antibiotics Given (last 72 hours)    Date/Time Action Medication Dose Rate   02/20/18 2142 New Bag/Given   meropenem (MERREM) 1 g in sodium chloride 0.9 % 100 mL IVPB 1 g 200 mL/hr   02/20/18 2231 New Bag/Given   vancomycin (VANCOCIN) IVPB 1000 mg/200 mL premix 1,000 mg 200 mL/hr   02/21/18 0501 New Bag/Given   vancomycin (VANCOCIN) IVPB 1000 mg/200 mL premix 1,000 mg 200 mL/hr   02/21/18 0615 New Bag/Given   meropenem (MERREM) 1 g in sodium chloride 0.9 % 100 mL IVPB 1 g 200 mL/hr   02/21/18 2109 New Bag/Given   meropenem (MERREM) 1 g in sodium chloride 0.9 % 100 mL IVPB 1 g 200 mL/hr   02/22/18 0528 New Bag/Given   meropenem (MERREM) 1 g in sodium chloride 0.9 % 100 mL IVPB 1 g 200 mL/hr     . aspirin EC  81 mg Oral Daily  . atorvastatin  20 mg Oral q1800  . dexamethasone  4 mg Oral Q12H  . docusate sodium  100 mg Oral BID  . insulin aspart  0-5 Units Subcutaneous QHS  . insulin aspart  0-9 Units Subcutaneous TID WC  . insulin aspart protamine- aspart  12 Units Subcutaneous BID WC  . levothyroxine  25 mcg Oral QAC breakfast  . lisinopril  40 mg Oral Daily  . living well with diabetes book   Does not apply Once    Objective: Vital signs in last 24 hours: Temp:  [97.7 F (36.5 C)-98.9 F (37.2 C)] 97.7 F (36.5 C) (04/19 0741) Pulse Rate:  [86-122] 88 (04/19 0741) Resp:  [18-19] 19 (04/19 0741) BP: (117-181)/(79-105) 148/102 (04/19 0741) SpO2:  [95 %-100 %] 97 % (04/19 0741) Weight:  [82.6 kg (182 lb)] 82.6 kg (182 lb) (04/19 0500) Physical  Exam  Constitutional:  oriented to person, place, and time. appears well-developed and well-nourished. No distress.  HENT: [place]/AT, PERRLA, no scleral icterus Mouth/Throat: Oropharynx is clear - poor dentition. No oropharyngeal exudate.  Cardiovascular: Normal rate, regular rhythm and normal heart sounds. Pulmonary/Chest: Effort normal and breath sounds normal. No respiratory distress.  has no wheezes.  Neck = L sided bulky firm LAN, decreased ttp Abdominal: Soft. Bowel sounds are normal.  exhibits no distension. There is no tenderness.  Lymphadenopathy: no cervical adenopathy. No axillary adenopathy Neurological: alert and oriented to person, place, and time.  Skin: Skin is warm and dry. No rash noted. No erythema.  Psychiatric: a normal mood and affect.  behavior is normal.   Lab Results Recent Labs    02/21/18 0206 02/22/18 0514  WBC 14.1* 11.7*  HGB 14.0 14.0  HCT 40.4 41.3  NA 134* 136  K 3.7 4.1  CL 104 105  CO2 24 22  BUN 9 7  CREATININE 0.49 0.46    Microbiology: Results for orders placed or performed during the hospital encounter of 02/20/18  Blood Culture (routine x 2)     Status: None (Preliminary result)   Collection Time: 02/20/18  5:23 PM  Result Value Ref Range Status   Specimen Description BLOOD RIGHT [facility]  Final   Special Requests   Final    BOTTLES DRAWN AEROBIC AND ANAEROBIC Blood Culture adequate volume   Culture   Final    NO GROWTH 2 DAYS Performed at [facility], [address], [place], [place] [address]    Report Status PENDING  Incomplete  Blood Culture (routine x 2)     Status: None (Preliminary result)   Collection Time: 02/20/18  5:28 PM  Result Value Ref Range Status   Specimen Description BLOOD LEFT [facility]  Final   Special Requests   Final    BOTTLES DRAWN AEROBIC AND ANAEROBIC Blood Culture adequate volume   Culture   Final    NO GROWTH 2 DAYS Performed at [facility], [address] [place], [place] [address]    Report  Status PENDING  Incomplete  Aerobic Culture (superficial specimen)     Status: None (Preliminary result)   Collection Time: 02/21/18 12:31 PM  Result Value Ref Range Status   Specimen Description   Final    NEEDLE ASPIRATE Performed at [facility], [address], [place], [place] [address]    Special Requests   Final    NONE Performed at [facility], [address] [place], [place] [address]    Gram Stain   Final    MODERATE WBC PRESENT, PREDOMINANTLY MONONUCLEAR NO ORGANISMS SEEN Performed at [facility], [address], [place], [place] [address]    Culture PENDING  Incomplete   Report Status PENDING  Incomplete  Aerobic/Anaerobic Culture (surgical/deep wound)     Status: None (Preliminary result)   Collection Time: 02/21/18 12:36 PM  Result Value Ref Range Status   Specimen Description TISSUE  Final   Special Requests LEFT LYMPH NODE BX  Final   Gram Stain   Final    RARE WBC PRESENT, PREDOMINANTLY MONONUCLEAR NO ORGANISMS SEEN Performed at [facility], [address], [place], [place] [address]    Culture PENDING  Incomplete   Report Status PENDING  Incomplete    Studies/Results: Dg Chest 2 View  Result Date: 02/20/2018 CLINICAL DATA:  Chest pain. EXAM: CHEST - 2 VIEW COMPARISON:  Radiographs of December 27, 2006. FINDINGS: The heart size and mediastinal contours are within normal limits. Both lungs are clear. No pneumothorax or pleural effusion is noted. The visualized skeletal structures are unremarkable. IMPRESSION: No active cardiopulmonary disease. Electronically Signed   By: Lupita Raider, M.D.   On: 02/20/2018 12:55   Ct Soft Tissue Neck W Contrast  Result Date: 02/20/2018 CLINICAL DATA:  LEFT neck swelling for 5 days. EXAM: CT NECK WITH CONTRAST TECHNIQUE: Multidetector CT imaging of the neck was performed using the standard protocol following the bolus administration of intravenous contrast. CONTRAST:  75mL OMNIPAQUE  IOHEXOL 300 MG/ML  SOLN COMPARISON:  None. FINDINGS: Pharynx and larynx: No visible pharyngeal mass or mucosal irregularity. Normal larynx. Salivary glands: No inflammation, mass, or stone. Thyroid: Diffusely enlarged. Recommend further evaluation with thyroid ultrasound. If patient is clinically hyperthyroid, consider nuclear medicine thyroid uptake and scan. Lymph nodes: Necrotic LEFT level 2B node, 19 mm, image 53 series 2. Similarly necrotic LEFT level 3 node, 16 mm. Other smaller nodes, LEFT greater than RIGHT throughout its level II, IV, and V. The LEFT sternocleidomastoid is enlarged and edematous as well. Vascular: Thrombosed LEFT jugular vein inferiorly. Surrounding fluid, greater than would be expected for venous  stasis, could represent phlegmon. This extends into the LEFT supraclavicular soft tissues. Atherosclerotic calcification of the carotid arteries, transverse arch, and great vessel origins. Limited intracranial: Negative. Visualized orbits: Negative Mastoids and visualized paranasal sinuses: Mild chronic sclerosis and dependent fluid. Skeleton: Spondylosis. Upper chest: Spiculated nodule RIGHT upper lobe, 6 x 7 mm. Other: None. IMPRESSION: Bulky LEFT neck adenopathy, centrally necrotic; most commonly, this appearance is associated with metastatic squamous cell carcinoma. No primary pharyngeal lesion is identified however. The degree of inflammatory change surrounding these nodes in the LEFT neck, with fluid and enlargement of the sternocleidomastoid, in the setting of elevated white count, suggest that they may possibly be inflammatory/infectious. Extranodal spread of tumor is not excluded. Tissue sampling is warranted. Thyromegaly. Recommend further evaluation with thyroid ultrasound. If patient is clinically hyperthyroid, consider nuclear medicine thyroid uptake and scan. Thrombosed LEFT jugular vein. 6 x 7 mm RIGHT upper lobe spiculated nodule. 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 CT chest with contrast recommended for further evaluation. Electronically Signed   By: Elsie Stain M.D.   On: 02/20/2018 17:17   [place] Core Biopsy (lymph Nodes)  Result Date: 02/21/2018 INDICATION: Cystic/centrally necrotic left cervical lymphadenopathy. Please perform ultrasound-guided biopsy for tissue diagnostic purposes. EXAM: ULTRASOUND GUIDED LEFT CERVICAL LYMPH NODE ASPIRATION AND CORE NEEDLE BIOPSY COMPARISON:  Neck CT - 02/20/2018; thyroid ultrasound - 02/20/2018 MEDICATIONS: None ANESTHESIA/SEDATION: None COMPLICATIONS: None immediate. TECHNIQUE: Informed written consent was obtained from the patient after a discussion of the risks, benefits and alternatives to treatment. Questions regarding the procedure were encouraged and answered. Initial ultrasound scanning demonstrated an approximately 2.3 cm anechoic lymph node within the left mid neck correlating with the dominant left-sided cervical lymph node seen on preceding contrast-enhanced neck CT image 53, series 2. An ultrasound image was saved for documentation purposes. The procedure was planned. A timeout was performed prior to the initiation of the procedure. The operative was prepped and draped in the usual sterile fashion, and a sterile drape was applied covering the operative field. A timeout was performed prior to the initiation of the procedure. Local anesthesia was provided with 1% lidocaine with epinephrine. Under direct ultrasound guidance, an 18 gauge needle was advanced into the central aspect of the anechoic lymph node. Despite appropriate needle positioning, only a trace (approximately 0.5 cc) of fluid was able to be aspirated from the lymph node. As such, the decision was made to proceed with ultrasound-guided core needle biopsy. Under direct ultrasound guidance, an 18 gauge core needle device was utilized to obtain to obtain 6 core needle  biopsies of the anechoic left-sided cervical lymph node. The samples were placed in saline and submitted to pathology. The needle was removed and hemostasis was achieved with manual compression. Post procedure scan was negative for significant hematoma. A dressing was placed. The patient tolerated the procedure well without immediate postprocedural complication. IMPRESSION: Technically successful ultrasound guided aspiration of approximately 0.5 cc of fluid from the dominant left-sided anechoic cervical lymph node followed by the acquisition of 6 core needle biopsy samples. Electronically Signed   By: Simonne Come M.D.   On: 02/21/2018 14:16   US Thyroid  Result Date: 02/20/2018 CLINICAL DATA:  Incidental on CT.  Thyroid nodule on neck CT EXAM: THYROID ULTRASOUND TECHNIQUE: Ultrasound examination of the thyroid gland and adjacent soft tissues was performed. COMPARISON:  Neck CT - 02/20/2018 FINDINGS: Parenchymal Echotexture: Markedly heterogenous Isthmus: Enlarged measuring 0.2  cm in diameter Right lobe: Enlarged measuring 5.8 x 3.3 x 2.6 cm Left lobe: Enlarged measures 6.1 x 2.7 x 3.1 cm _________________________________________________________ Estimated total number of nodules >/= 1 cm: 1 Number of spongiform nodules >/=  2 cm not described below (TR1): 0 Number of mixed cystic and solid nodules >/= 1.5 cm not described below (TR2): 0 _________________________________________________________ Ronnald Nian approximately 1.2 x 1.0 x 0.8 and approximately 0.8 x 0.7 x 0.7 cm hyperechoic nodules within the right lobe of the thyroid are both favored to represent pseudo nodules given background heterogeneity of the thyroid gland, however regardless do not meet imaging criteria to recommend percutaneous sampling or continued dedicated follow-up. Redemonstrated hypoechoic likely cystic left-sided cervical lymph nodes with dominant lymph node measuring 1.7 cm in greatest short axis diameter (image 69). IMPRESSION: 1.  Enlarged and markedly heterogeneous appearing thyroid gland without discrete worrisome thyroid nodule or mass. Findings are nonspecific though could be seen in the setting of a thyroiditis. 2. Cystic, potentially suppurative or necrotic left cervical lymph nodes as demonstrated on preceding contrast-enhanced neck CT. The above is in keeping with the ACR TI-RADS recommendations - J Am Coll Radiol 2017;14:587-595. Electronically Signed   By: Simonne Come M.D.   On: 02/20/2018 18:43   [place] Fna Soft Tissue  Result Date: 02/21/2018 INDICATION: Cystic/centrally necrotic left cervical lymphadenopathy. Please perform ultrasound-guided biopsy for tissue diagnostic purposes. EXAM: ULTRASOUND GUIDED LEFT CERVICAL LYMPH NODE ASPIRATION AND CORE NEEDLE BIOPSY COMPARISON:  Neck CT - 02/20/2018; thyroid ultrasound - 02/20/2018 MEDICATIONS: None ANESTHESIA/SEDATION: None COMPLICATIONS: None immediate. TECHNIQUE: Informed written consent was obtained from the patient after a discussion of the risks, benefits and alternatives to treatment. Questions regarding the procedure were encouraged and answered. Initial ultrasound scanning demonstrated an approximately 2.3 cm anechoic lymph node within the left mid neck correlating with the dominant left-sided cervical lymph node seen on preceding contrast-enhanced neck CT image 53, series 2. An ultrasound image was saved for documentation purposes. The procedure was planned. A timeout was performed prior to the initiation of the procedure. The operative was prepped and draped in the usual sterile fashion, and a sterile drape was applied covering the operative field. A timeout was performed prior to the initiation of the procedure. Local anesthesia was provided with 1% lidocaine with epinephrine. Under direct ultrasound guidance, an 18 gauge needle was advanced into the central aspect of the anechoic lymph node. Despite appropriate needle positioning, only a trace (approximately 0.5 cc) of  fluid was able to be aspirated from the lymph node. As such, the decision was made to proceed with ultrasound-guided core needle biopsy. Under direct ultrasound guidance, an 18 gauge core needle device was utilized to obtain to obtain 6 core needle biopsies of the anechoic left-sided cervical lymph node. The samples were placed in saline and submitted to pathology. The needle was removed and hemostasis was achieved with manual compression. Post procedure scan was negative for significant hematoma. A dressing was placed. The patient tolerated the procedure well without immediate postprocedural complication. IMPRESSION: Technically successful ultrasound guided aspiration of approximately 0.5 cc of fluid from the dominant left-sided anechoic cervical lymph node followed by the acquisition of 6 core needle biopsy samples. Electronically Signed   By: Simonne Come M.D.   On: 02/21/2018 14:16    Assessment/Plan: MADELYNE MILLIKAN is a 57 y.o. female with a history of hypothyroidism and hypertension who was admitted with a month of tooth pain and then several days left neck swelling and pain as well  as fevers.  She also reports recent dx of Strep throat (but no testing done) and was treated with abx - she thinks amox.  CT scan shows necrotic lymph nodes as well as thrombosed left jugular vein and thyromegaly.  Patient has been started on meropenem.  She is been seen by ENT.  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  Lemierres is most commonly caused by  oral flora especially fusobacterium. 4/19- much less tender and some minor decrease in swelling in L neck. Cxs neg, path pending.  She reports she is not allergic to PCN- had rash in 1988 from amox but has had it several times since then without issue.   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 Will follow with you.  Mick Sell   02/22/2018, 9:54 AM

## 2018-02-22 NOTE — Progress Notes (Signed)
Peripherally Inserted Central Catheter/Midline Placement  The IV Nurse has discussed with the patient and/or persons authorized to consent for the patient, the purpose of this procedure and the potential benefits and risks involved with this procedure.  The benefits include less needle sticks, lab draws from the catheter, and the patient may be discharged home with the catheter. Risks include, but not limited to, infection, bleeding, blood clot (thrombus formation), and puncture of an artery; nerve damage and irregular heartbeat and possibility to perform a PICC exchange if needed/ordered by physician.  Alternatives to this procedure were also discussed.  B[employer] Power PICC patient education guide, fact sheet on infection prevention and patient information card has been provided to patient /or left at bedside.    PICC/Midline Placement Documentation  PICC Single Lumen 02/22/18 PICC Right Brachial 38 cm 1 cm (Active)  Indication for Insertion or Continuance of Line Home intravenous therapies (PICC only) 02/22/2018  6:54 PM  Exposed Catheter (cm) 1 cm 02/22/2018  6:54 PM  Site Assessment Clean;Dry;Intact 02/22/2018  6:54 PM  Line Status Flushed;Saline locked;Blood return noted 02/22/2018  6:54 PM  Dressing Type Transparent 02/22/2018  6:54 PM  Dressing Status Clean;Dry;Intact;Antimicrobial disc in place 02/22/2018  6:54 PM  Dressing Change Due 03/01/18 02/22/2018  6:54 PM       Jaliya Siegmann, Lajean ManesKerry Loraine 02/22/2018, 6:55 PM

## 2018-02-23 LAB — CBC
HEMATOCRIT: 37.8 % (ref 35.0–47.0)
HEMOGLOBIN: 13.1 g/dL (ref 12.0–16.0)
MCH: 30.2 pg (ref 26.0–34.0)
MCHC: 34.7 g/dL (ref 32.0–36.0)
MCV: 87.2 fL (ref 80.0–100.0)
Platelets: 401 10*3/uL (ref 150–440)
RBC: 4.33 MIL/uL (ref 3.80–5.20)
RDW: 12.4 % (ref 11.5–14.5)
WBC: 12.8 10*3/uL — ABNORMAL HIGH (ref 3.6–11.0)

## 2018-02-23 LAB — GLUCOSE, CAPILLARY
Glucose-Capillary: 246 mg/dL — ABNORMAL HIGH (ref 65–99)
Glucose-Capillary: 225 mg/dL — ABNORMAL HIGH (ref 65–99)

## 2018-02-23 LAB — HEPARIN LEVEL (UNFRACTIONATED): Heparin Unfractionated: 0.43 IU/mL (ref 0.30–0.70)

## 2018-02-23 MED ORDER — METOPROLOL TARTRATE 50 MG PO TABS: 50.0000 mg | ORAL_TABLET | Freq: Two times a day (BID) | ORAL | 0 refills | Status: DC

## 2018-02-23 MED ORDER — INSULIN STARTER KIT- SYRINGES (ENGLISH): 1.0000 | Freq: Once | 0 refills | Status: AC

## 2018-02-23 MED ORDER — METOPROLOL TARTRATE 50 MG PO TABS: 50.0000 mg | ORAL_TABLET | Freq: Two times a day (BID) | ORAL | Status: DC

## 2018-02-23 MED ORDER — ELIQUIS 5 MG VTE STARTER PACK: ORAL_TABLET | ORAL | 0 refills | Status: DC

## 2018-02-23 MED ORDER — CEFTRIAXONE IV (FOR PTA / DISCHARGE USE ONLY): 2.0000 g | INTRAVENOUS | 0 refills | Status: AC

## 2018-02-23 MED ORDER — APIXABAN 5 MG PO TABS: 5.0000 mg | ORAL_TABLET | Freq: Two times a day (BID) | ORAL | Status: DC

## 2018-02-23 MED ORDER — INSULIN ASPART PROT & ASPART (70-30 MIX) 100 UNIT/ML ~~LOC~~ SUSP: 12.0000 [IU] | Freq: Two times a day (BID) | SUBCUTANEOUS | 0 refills | Status: DC

## 2018-02-23 MED ORDER — METOPROLOL TARTRATE 50 MG PO TABS
50.0000 mg | ORAL_TABLET | Freq: Two times a day (BID) | ORAL | Status: DC
  Administered 2018-02-23: 50 mg via ORAL
  Filled 2018-02-23: qty 1

## 2018-02-23 MED ORDER — APIXABAN 5 MG PO TABS
10.0000 mg | ORAL_TABLET | Freq: Two times a day (BID) | ORAL | Status: DC
  Administered 2018-02-23: 10 mg via ORAL
  Filled 2018-02-23 (×2): qty 2

## 2018-02-23 MED ORDER — BLOOD GLUCOSE MONITOR KIT: PACK | 0 refills | Status: AC

## 2018-02-23 MED ORDER — METRONIDAZOLE 500 MG PO TABS
500.0000 mg | ORAL_TABLET | Freq: Three times a day (TID) | ORAL | 0 refills | Status: AC
Start: 2018-02-23 — End: 2018-03-09

## 2018-02-23 MED ORDER — LISINOPRIL 40 MG PO TABS: 40.0000 mg | ORAL_TABLET | Freq: Every day | ORAL | 0 refills | Status: DC

## 2018-02-23 NOTE — Discharge Instructions (Addendum)
Bleeding Precautions When on Anticoagulant Therapy WHAT IS ANTICOAGULANT THERAPY? Anticoagulant therapy is taking medicine to prevent or reduce blood clots. It is also called blood thinner therapy. Blood clots that form in your blood vessels can be dangerous. They can break loose and travel to your heart, lungs, or brain. This increases your risk of a heart attack or stroke. Anticoagulant therapy causes blood to clot more slowly. You may need anticoagulant therapy if you have:  A medical condition that increases the likelihood that blood clots will form.  A heart defect or a problem with heart rhythm. It is also a common treatment after heart surgery, such as valve replacement. WHAT ARE COMMON TYPES OF ANTICOAGULANT THERAPY? Anticoagulant medicine can be injected or taken by mouth.If you need anticoagulant therapy quickly at the hospital, the medicine may be injected under your skin or given through an IV tube. Heparin is a common example of an anticoagulant that you may get at the hospital. Most anticoagulant therapy is in the form of pills that you take at home every day. These may include:  Aspirin. This common blood thinner works by preventing blood cells (platelets) from sticking together to form a clot. Aspirin is not as strong as anticoagulants that slow down the time that it takes for your body to form a clot.  Clopidogrel. This is a newer type of drug that affects platelets. It is stronger than aspirin.  Warfarin. This is the most common anticoagulant. It changes the way your body uses vitamin K, a vitamin that helps your blood to clot. The risk of bleeding is higher with warfarin than with aspirin. You will need frequent blood tests to make sure you are taking the safest amount.  New anticoagulants. Several new drugs have been approved. They are all taken by mouth. Studies show that these drugs work as well as warfarin. They do not require blood testing. They may cause less bleeding  risk than warfarin. WHAT DO I NEED TO REMEMBER WHEN TAKING ANTICOAGULANT THERAPY? Anticoagulant therapy decreases your risk of forming a blood clot, but it increases your risk of bleeding. Work closely with your health care provider to make sure you are taking your medicine safely. These tips can help:  Learn ways to reduce your risk of bleeding.  If you are taking warfarin: ? Have blood tests as ordered by your health care provider. ? Do not make any sudden changes to your diet. Vitamin K in your diet can make warfarin less effective. ? Do not get pregnant. This medicine may cause birth defects.  Take your medicine at the same time every day. If you forget to take your medicine, take it as soon as you remember. If you miss a whole day, do not double your dose of medicine. Take your normal dose and call your health care provider to check in.  Do not stop taking your medicine on your own.  Tell your health care provider before you start taking any new medicine, vitamin, or herbal product. Some of these could interfere with your therapy.  Tell all of your health care providers that you are on anticoagulant therapy.  Do not have surgery, medical procedures, or dental work until you tell your health care provider that you are on anticoagulant therapy. WHAT CAN AFFECT HOW ANTICOAGULANTS WORK? Certain foods, vitamins, medicines, supplements, and herbal medicines change the way that anticoagulant therapy works. They may increase or decrease the effects of your anticoagulant therapy. Either result can be dangerous for you.  Many over-the-counter medicines for pain, colds, or stomach problems interfere with anticoagulant therapy. Take these only as told by your health care provider.  Do not drink alcohol. It can interfere with your medicine and increase your risk of an injury that causes bleeding.  If you are taking warfarin, do not begin eating more foods that contain vitamin K. These include  leafy green vegetables. Ask your health care provider if you should avoid any foods. WHAT ARE SOME WAYS TO PREVENT BLEEDING? You can prevent bleeding by taking certain precautions:  Be extra careful when you use knives, scissors, or other sharp objects.  Use an electric razor instead of a blade.  Do not use toothpicks.  Use a soft toothbrush.  Wear shoes that have nonskid soles.  Use bath mats and handrails in your bathroom.  Wear gloves while you do yard work.  Wear a helmet when you ride a bike.  Wear your seat belt.  Prevent falls by removing loose rugs and extension cords from areas where you walk.  Do not play contact sports or participate in other activities that have a high risk of injury. WHEN SHOULD I CONTACT MY HEALTH CARE PROVIDER? Call your health care provider if:  You miss a dose of medicine: ? And you are not sure what to do. ? For more than one day.  You have: ? Menstrual bleeding that is heavier than normal. ? Blood in your urine. ? A bloody nose or bleeding gums. ? Easy bruising. ? Blood in your stool (feces) or have black and tarry stool. ? Side effects from your medicine.  You feel weak or dizzy.  You become pregnant. Seek immediate medical care if:  You have bleeding that will not stop.  You have sudden and severe headache or belly pain.  You vomit or you cough up bright red blood.  You have a severe blow to your head. WHAT ARE SOME QUESTIONS TO ASK MY HEALTH CARE PROVIDER?  What is the best anticoagulant therapy for my condition?  What side effects should I watch for?  When should I take my medicine? What should I do if I forget to take it?  Will I need to have regular blood tests?  Do I need to change my diet? Are there foods or drinks that I should avoid?  What activities are safe for me?  What should I do if I want to get pregnant? This information is not intended to replace advice given to you by your health care provider.  Make sure you discuss any questions you have with your health care provider. Document Released: 10/04/2015 Document Reviewed: 10/04/2015 Elsevier Interactive Patient Education  2017 [employer]   Diabetic diet  Check blood sugars 4 times a day. 3 times before meals and at bedtime. Keep log and take to your doctors appointment.  Activity as tolerated

## 2018-02-23 NOTE — Progress Notes (Signed)
ANTICOAGULATION CONSULT NOTE  Pharmacy Consult for heparin Indication: chest pain/ACS  Allergies  Allergen Reactions  . Penicillins Hives    Has patient had a PCN reaction causing immediate rash, facial/tongue/throat swelling, SOB or lightheadedness with hypotension: Unknown Has patient had a PCN reaction causing severe rash involving mucus membranes or skin necrosis: No Has patient had a PCN reaction that required hospitalization: No Has patient had a PCN reaction occurring within the last 10 years: No If all of the above answers are "NO", then may proceed with Cephalosporin use.   . Naproxen     REACTION: hives    Patient Measurements: Height: 5\' 6"  (167.6 cm) Weight: 182 lb (82.6 kg) IBW/kg (Calculated) : 59.3 Heparin Dosing Weight: 76.7 kg  Vital Signs: Temp: 97.9 F (36.6 C) (04/20 0424) Temp Source: Oral (04/20 0424) BP: 137/76 (04/20 0424) Pulse Rate: 68 (04/20 0424)  Labs: Recent Labs    02/20/18 1230  02/20/18 1737 02/20/18 2021  02/21/18 0206  02/21/18 0753 02/21/18 0805  02/22/18 0514 02/22/18 1235 02/22/18 1939 02/23/18 0513  HGB 14.3  --   --   --   --  14.0  --   --   --   --  14.0  --   --  13.1  HCT 41.9  --   --   --   --  40.4  --   --   --   --  41.3  --   --  37.8  PLT 336  --   --   --   --  340  --   --   --   --  374  --   --  401  APTT  --   --  37*  --   --   --   --   --   --   --   --   --   --   --   LABPROT  --   --  14.1  --   --   --   --   --   --   --   --   --   --   --   INR  --   --  1.10  --   --   --   --   --   --   --   --   --   --   --   HEPARINUNFRC  --   --   --   --    < >  --    < >  --   --    < > 0.26* 0.35 0.31 0.43  CREATININE 0.64  --   --   --   --  0.49  --   --   --   --  0.46  --   --   --   CKTOTAL  --   --   --   --   --   --   --   --  46  --   --   --   --   --   TROPONINI <0.03   < >  --  0.35*  --  0.32*  --  0.23*  --   --   --   --   --   --    < > = values in this interval not displayed.     Estimated Creatinine Clearance: 83 mL/min (by C-G formula based on SCr of 0.46  mg/dL).   Medical History: Past Medical History:  Diagnosis Date  . Hypertension     Medications:  Infusions:  . cefTRIAXone (ROCEPHIN)  IV 2 g (02/22/18 1705)  . heparin 1,850 Units/hr (02/22/18 2227)    Assessment: 57 yof cc chest pain with PMH HTN. Troponin <0.03 -- 0.18 in ED. ECG with sinus tachycardia, possible LAE, and nonspecific ST abnormality. Pharmacy consulted to dose heparin for ACS. PTA list is not completed - will follow up when available.  appears only on ASA PTA  Goal of Therapy:  Heparin level 0.3-0.7 units/ml Monitor platelets by anticoagulation protocol: Yes   Plan:  4/17 17:37 baseline aPTT 37, INR 1.1. No PTA OAC noted on updated PTA med list. Continue as above.   4/17 2300 heparin level <0.10. 2300 unit bolus and increase rate to 1200 units/hr. Recheck in 6 hours.  4/18 0700 Heparin level subtherapeutic at 0.16. Will bolus with 2300 units and increase infusion to 1450u/hr. Will recheck HL in 6 hours.   4/18 Heparin drip stopped for procedure this am. Heparin drip restarted ~1410 at 1450 units/hr. Will order HL in 6 hours.  4/18:  HL @ 20:30 = 0.13  Will order Heparin 2300 units IV X 1 bolus and increase drip rate to 1700 units/hr.  Will recheck HL 6 hrs after rate change.   4/19 AM heparin level 0.26. 1200 unit bolus and increase rate to 1850 units/hr. Recheck in 6 hours.  4/19@1300  HL 0.35, will continue heparin iv 1850units/hr. Recheck in 6 hours   4/19:  HL @ 19:39 = 0.31  Will continue pt on current rate and recheck HL on 4/20 with AM labs.   4/20 AM heparin level 0.43. Continue current regimen. Recheck heparin level and CBC with tomorrow AM labs.  Erich Montane, PharmD Clinical Pharmacist 02/23/2018,5:45 AM

## 2018-02-23 NOTE — Progress Notes (Signed)
ANTICOAGULATION CONSULT NOTE - Initial Consult  Pharmacy Consult for Eliquis Indication: Left IJ thrombus  Allergies  Allergen Reactions  . Penicillins Hives    Has patient had a PCN reaction causing immediate rash, facial/tongue/throat swelling, SOB or lightheadedness with hypotension: Unknown Has patient had a PCN reaction causing severe rash involving mucus membranes or skin necrosis: No Has patient had a PCN reaction that required hospitalization: No Has patient had a PCN reaction occurring within the last 10 years: No If all of the above answers are "NO", then may proceed with Cephalosporin use.   . Naproxen     REACTION: hives    Patient Measurements: Height: 5\' 6"  (167.6 cm) Weight: 182 lb (82.6 kg) IBW/kg (Calculated) : 59.3 Heparin Dosing Weight:   Vital Signs: Temp: 97.9 F (36.6 C) (04/20 0424) Temp Source: Oral (04/20 0424) BP: 167/97 (04/20 0813) Pulse Rate: 82 (04/20 0813)  Labs: Recent Labs    02/20/18 1230  02/20/18 1737 02/20/18 2021  02/21/18 0206  02/21/18 0753 02/21/18 0805  02/22/18 0514 02/22/18 1235 02/22/18 1939 02/23/18 0513  HGB 14.3  --   --   --   --  14.0  --   --   --   --  14.0  --   --  13.1  HCT 41.9  --   --   --   --  40.4  --   --   --   --  41.3  --   --  37.8  PLT 336  --   --   --   --  340  --   --   --   --  374  --   --  401  APTT  --   --  37*  --   --   --   --   --   --   --   --   --   --   --   LABPROT  --   --  14.1  --   --   --   --   --   --   --   --   --   --   --   INR  --   --  1.10  --   --   --   --   --   --   --   --   --   --   --   HEPARINUNFRC  --   --   --   --    < >  --    < >  --   --    < > 0.26* 0.35 0.31 0.43  CREATININE 0.64  --   --   --   --  0.49  --   --   --   --  0.46  --   --   --   CKTOTAL  --   --   --   --   --   --   --   --  46  --   --   --   --   --   TROPONINI <0.03   < >  --  0.35*  --  0.32*  --  0.23*  --   --   --   --   --   --    < > = values in this interval not displayed.     Estimated Creatinine Clearance: 83 mL/min (by C-G formula based on  SCr of 0.46 mg/dL).   Medical History: Past Medical History:  Diagnosis Date  . Hypertension     Medications:  Infusions:  . 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 Patient had been on heparin.  Goal of Therapy:   Monitor platelets by anticoagulation protocol: Yes   Plan:  Eliquis 10 mg po BID x 7 days followed by Eliquis 5 mg po BID  Carola Frost, Pharm.D., BCPS Clinical Pharmacist 02/23/2018,10:23 AM

## 2018-02-23 NOTE — Progress Notes (Signed)
Patient discharged via wheelchair and private vehicle. PICC line intact and patent. All discharge instructions given about Diabetes and checking her sugars at home as well as maintaining her picc line. Verbalized understanding. No complaints at time of discharge.

## 2018-02-23 NOTE — Care Management (Addendum)
Will need Rocephin 2 grams daily x 14 days. No insurance. States she sees Dr. Milinda Antisower. PICC placed yesterday. Vaughan BastaJermaine, [facility] representative updated. Will run prescription. [facility] [address]. Will given glucometer, Eliquis coupon given Gwenette GreetBrenda S Youssouf Shipley RN MSN CCM Care Management [phone]

## 2018-02-24 LAB — AEROBIC CULTURE W GRAM STAIN (SUPERFICIAL SPECIMEN)

## 2018-02-24 LAB — AEROBIC CULTURE  (SUPERFICIAL SPECIMEN): CULTURE: NO GROWTH

## 2018-02-25 ENCOUNTER — Telehealth: Payer: Self-pay

## 2018-02-25 ENCOUNTER — Encounter: Payer: Self-pay | Admitting: Interventional Radiology

## 2018-02-25 LAB — CULTURE, BLOOD (ROUTINE X 2)
Culture: NO GROWTH
SPECIAL REQUESTS: ADEQUATE
Special Requests: ADEQUATE

## 2018-02-25 LAB — SURGICAL PATHOLOGY

## 2018-02-25 NOTE — Telephone Encounter (Signed)
PLEASE NOTE: All timestamps contained within this report are represented as [place] Standard Time. CONFIDENTIALTY NOTICE: This fax transmission is intended only for the addressee. It contains information that is legally privileged, confidential or otherwise protected from use or disclosure. If you are not the intended recipient, you are strictly prohibited from reviewing, disclosing, copying using or disseminating any of this information or taking any action in reliance on or regarding this information. If you have received this fax in error, please notify us immediately by telephone so that we can arrange for its return to us. Phone: [phone], Toll-Free: [phone], Fax: [phone] Page: 1 of 1 Call Id: [id number] [place] [place] [place] Night - Client Nonclinical Telephone Record [facility] Client [facility] [place] [place] Night - Client [place] [facility] [place] - Night Physician Raechel Acheuncan, [place] - MD Contact Type Call Who Is Calling Patient / Member / Family / Caregiver Caller Name Pam Patel Caller Phone Number [phone] Patient Name Pam Patel Patient DOB 05/09/1961 Call Type Message Only Information Provided Reason for Call Request to Schedule Office Appointment Initial Comment Caller states they have a former patient & needs appointment Additional Comment Declined triage , just needs an appointment . Please call Monday for an appointment Call Closed By: Rolm GalaKayley McDonald Transaction Date/Time: 02/23/2018 11:43:22 AM (ET)

## 2018-02-25 NOTE — Telephone Encounter (Signed)
Appointment 5/10 with debbie  Pt aware

## 2018-02-26 ENCOUNTER — Telehealth: Payer: Self-pay | Admitting: Family Medicine

## 2018-02-26 LAB — AEROBIC/ANAEROBIC CULTURE (SURGICAL/DEEP WOUND): CULTURE: NO GROWTH

## 2018-02-26 LAB — AEROBIC/ANAEROBIC CULTURE W GRAM STAIN (SURGICAL/DEEP WOUND)

## 2018-02-26 NOTE — Telephone Encounter (Signed)
Copied from CRM [id number]. Topic: Quick Communication - See Telephone Encounter >> Feb 26, 2018  3:03 PM Waymon AmatoBurton, Donna F wrote: Lupita Leashonna with [facility] is calling to let office know that pt was admitted for iv antibiotics and is needing to get verbal orders to help pt care give plan of care and how to give instruction of medications , labs once a week sent to the provider   Best number [phone]

## 2018-02-26 NOTE — Telephone Encounter (Signed)
I spoke with Lupita Leashonna and Harlin Heys Gessner FNP does not have appt to see pt until 03/15/18 to establish as a new pt. Harlin Heys Gessner FNP is out of office this wk. Pt saw Dr Sampson GoonFitzgerald recently during hospital admission. Lupita LeashDonna will contact Dr Sampson GoonFitzgerald about IV orders. FYI to Harlin Heys Gessner FNP.

## 2018-02-26 NOTE — Telephone Encounter (Signed)
Copied from CRM #[id number]. Topic: Quick Communication - See Telephone Encounter °>> Feb 26, 2018  3:03 PM Burton, Donna F wrote: °Donna with [facility] is calling to let office know that pt was admitted for iv antibiotics and is needing to get verbal orders to help pt care give plan of care and how to give instruction of medications , labs once a week sent to the provider  ° °Best number [phone] °

## 2018-02-27 NOTE — Telephone Encounter (Signed)
I will see her then, thanks  

## 2018-02-27 NOTE — Telephone Encounter (Signed)
Carrie scheduled pt with Dr Milinda Antisower for 45' appt on 03/06/18 at 3 PM. Offered pt sooner appt but did not work with pts schedule.FYI to Harlin Heys Gessner FNP and Dr Milinda Antisower.

## 2018-02-27 NOTE — Telephone Encounter (Signed)
Dr. Milinda Antisower has seen patient in the past and has agreed to have patient re-establish with her.

## 2018-03-06 ENCOUNTER — Ambulatory Visit: Payer: Self-pay | Admitting: Family Medicine

## 2018-03-06 ENCOUNTER — Encounter: Payer: Self-pay | Admitting: Family Medicine

## 2018-03-06 VITALS — BP 126/72 | HR 95 | Temp 98.1°F | Ht 65.5 in | Wt 180.0 lb

## 2018-03-06 DIAGNOSIS — L089 Local infection of the skin and subcutaneous tissue, unspecified: Secondary | ICD-10-CM

## 2018-03-06 DIAGNOSIS — K089 Disorder of teeth and supporting structures, unspecified: Secondary | ICD-10-CM

## 2018-03-06 DIAGNOSIS — E7800 Pure hypercholesterolemia, unspecified: Secondary | ICD-10-CM

## 2018-03-06 DIAGNOSIS — I10 Essential (primary) hypertension: Secondary | ICD-10-CM

## 2018-03-06 DIAGNOSIS — E119 Type 2 diabetes mellitus without complications: Secondary | ICD-10-CM | POA: Insufficient documentation

## 2018-03-06 DIAGNOSIS — E039 Hypothyroidism, unspecified: Secondary | ICD-10-CM

## 2018-03-06 DIAGNOSIS — R221 Localized swelling, mass and lump, neck: Secondary | ICD-10-CM

## 2018-03-06 DIAGNOSIS — E1142 Type 2 diabetes mellitus with diabetic polyneuropathy: Secondary | ICD-10-CM | POA: Insufficient documentation

## 2018-03-06 DIAGNOSIS — E1140 Type 2 diabetes mellitus with diabetic neuropathy, unspecified: Secondary | ICD-10-CM | POA: Insufficient documentation

## 2018-03-06 DIAGNOSIS — I82890 Acute embolism and thrombosis of other specified veins: Secondary | ICD-10-CM

## 2018-03-06 DIAGNOSIS — E010 Iodine-deficiency related diffuse (endemic) goiter: Secondary | ICD-10-CM

## 2018-03-06 DIAGNOSIS — F410 Panic disorder [episodic paroxysmal anxiety] without agoraphobia: Secondary | ICD-10-CM

## 2018-03-06 DIAGNOSIS — E1165 Type 2 diabetes mellitus with hyperglycemia: Secondary | ICD-10-CM

## 2018-03-06 DIAGNOSIS — Z87891 Personal history of nicotine dependence: Secondary | ICD-10-CM

## 2018-03-06 MED ORDER — INSULIN ASPART PROT & ASPART (70-30 MIX) 100 UNIT/ML ~~LOC~~ SUSP: 17.0000 [IU] | Freq: Two times a day (BID) | SUBCUTANEOUS | 1 refills | Status: DC

## 2018-03-06 MED ORDER — PAROXETINE HCL 10 MG PO TABS: 10.0000 mg | ORAL_TABLET | Freq: Every day | ORAL | 11 refills | Status: DC

## 2018-03-06 MED ORDER — LEVOTHYROXINE SODIUM 50 MCG PO TABS: 50.0000 ug | ORAL_TABLET | Freq: Every day | ORAL | 3 refills | Status: DC

## 2018-03-06 MED ORDER — APIXABAN 5 MG PO TABS: 5.0000 mg | ORAL_TABLET | Freq: Two times a day (BID) | ORAL | 3 refills | Status: DC

## 2018-03-06 NOTE — Progress Notes (Signed)
Subjective:    Patient ID: Pam Patel, female    DOB: 17-Jul-1961, 57 y.o.   MRN: [id number]  HPI 57 yo female here to re est care after many years   Wt Readings from Last 3 Encounters:  03/06/18 180 lb (81.6 kg)  02/22/18 182 lb (82.6 kg)   29.50 kg/m   No insurance/self pay  Was recently hospitalized for L sided neck mass/infection   (April 17-22nd)  Jugular vein thrombosis and elevated troponin Pulmonary nodule ? 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  Pos blood cx for viridans strep  Asp of node- abscess with neg cx  D/c on IV ceftriaxone and oral flagyl  Remains on blood thinner for jugular vein thrombosis - do not expect prolonged course  Will transition to augmentin today for 14 d course on sat   On Saturday her picc line will come out She is able to admin her own medicines  Did 7 d of eliquis 5 mg twice daily   Echo and cardiac w/u was good     Hx of hypothyroidism Lab Results  Component Value Date   TSH 17.867 (H) 02/21/2018   has on thyroid med in hospital- needs to start on one     Dg Chest 2 View  Result Date: 02/20/2018 CLINICAL DATA:  Chest pain. EXAM: CHEST - 2 VIEW COMPARISON:  Radiographs of December 27, 2006. FINDINGS: The heart size and mediastinal contours are within normal limits. Both lungs are clear. No pneumothorax or pleural effusion is noted. The visualized skeletal structures are unremarkable. IMPRESSION: No active cardiopulmonary disease. Electronically Signed   By: Marijo Conception, M.D.   On: 02/20/2018 12:55   Ct Soft Tissue Neck W Contrast  Result Date: 02/20/2018 CLINICAL DATA:  LEFT neck swelling for 5 days. EXAM: CT NECK WITH CONTRAST TECHNIQUE: Multidetector CT imaging of the  neck was performed using the standard protocol following the bolus administration of intravenous contrast. CONTRAST:  108m OMNIPAQUE IOHEXOL 300 MG/ML  SOLN COMPARISON:  None. FINDINGS: Pharynx and larynx: No visible pharyngeal mass or mucosal irregularity. Normal larynx. Salivary glands: No inflammation, mass, or stone. Thyroid: Diffusely enlarged. Recommend further evaluation with thyroid ultrasound. If patient is clinically hyperthyroid, consider nuclear medicine thyroid uptake and scan. Lymph nodes: Necrotic LEFT level 2B node, 19 mm, image 53 series 2. Similarly necrotic LEFT level 3 node, 16 mm. Other smaller nodes, LEFT greater than RIGHT throughout its level II, IV, and V. The LEFT sternocleidomastoid is enlarged and edematous as well. Vascular: Thrombosed LEFT jugular vein inferiorly. Surrounding fluid, greater than would be expected for venous stasis, could represent phlegmon. This extends into the LEFT supraclavicular soft tissues. Atherosclerotic calcification of the carotid arteries, transverse arch, and great vessel origins. Limited intracranial: Negative. Visualized orbits: Negative Mastoids and visualized paranasal sinuses: Mild chronic sclerosis and dependent fluid. Skeleton: Spondylosis. Upper chest: Spiculated nodule RIGHT upper lobe, 6 x 7 mm. Other: None. IMPRESSION: Bulky LEFT neck adenopathy, centrally necrotic; most commonly, this appearance is associated with metastatic squamous cell carcinoma. No primary pharyngeal lesion is identified however. The degree of inflammatory change surrounding these nodes in the LEFT neck, with fluid and enlargement of the sternocleidomastoid, in the setting of  elevated white count, suggest that they may possibly be inflammatory/infectious. Extranodal spread of tumor is not excluded. Tissue sampling is warranted. Thyromegaly. Recommend further evaluation with thyroid ultrasound. If patient is clinically hyperthyroid, consider nuclear medicine thyroid uptake and  scan. Thrombosed LEFT jugular vein. 6 x 7 mm RIGHT upper lobe spiculated nodule. 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 CT chest with contrast recommended for further evaluation. Electronically Signed   By: Staci Righter M.D.   On: 02/20/2018 17:17   [place] Core Biopsy (lymph Nodes)  Result Date: 02/21/2018 INDICATION: Cystic/centrally necrotic left cervical lymphadenopathy. Please perform ultrasound-guided biopsy for tissue diagnostic purposes. EXAM: ULTRASOUND GUIDED LEFT CERVICAL LYMPH NODE ASPIRATION AND CORE NEEDLE BIOPSY COMPARISON:  Neck CT - 02/20/2018; thyroid ultrasound - 02/20/2018 MEDICATIONS: None ANESTHESIA/SEDATION: None COMPLICATIONS: None immediate. TECHNIQUE: Informed written consent was obtained from the patient after a discussion of the risks, benefits and alternatives to treatment. Questions regarding the procedure were encouraged and answered. Initial ultrasound scanning demonstrated an approximately 2.3 cm anechoic lymph node within the left mid neck correlating with the dominant left-sided cervical lymph node seen on preceding contrast-enhanced neck CT image 53, series 2. An ultrasound image was saved for documentation purposes. The procedure was planned. A timeout was performed prior to the initiation of the procedure. The operative was prepped and draped in the usual sterile fashion, and a sterile drape was applied covering the operative field. A timeout was performed prior to the initiation of the procedure. Local anesthesia was provided with 1% lidocaine with epinephrine. Under direct ultrasound guidance, an 18 gauge needle was advanced into the central aspect of the anechoic lymph node. Despite appropriate needle positioning, only a trace (approximately 0.5 cc) of fluid was able to be aspirated from the lymph node. As such, the decision was made to proceed with ultrasound-guided core  needle biopsy. Under direct ultrasound guidance, an 18 gauge core needle device was utilized to obtain to obtain 6 core needle biopsies of the anechoic left-sided cervical lymph node. The samples were placed in saline and submitted to pathology. The needle was removed and hemostasis was achieved with manual compression. Post procedure scan was negative for significant hematoma. A dressing was placed. The patient tolerated the procedure well without immediate postprocedural complication. IMPRESSION: Technically successful ultrasound guided aspiration of approximately 0.5 cc of fluid from the dominant left-sided anechoic cervical lymph node followed by the acquisition of 6 core needle biopsy samples. Electronically Signed   By: Sandi Mariscal M.D.   On: 02/21/2018 14:16   US Thyroid  Result Date: 02/20/2018 CLINICAL DATA:  Incidental on CT.  Thyroid nodule on neck CT EXAM: THYROID ULTRASOUND TECHNIQUE: Ultrasound examination of the thyroid gland and adjacent soft tissues was performed. COMPARISON:  Neck CT - 02/20/2018 FINDINGS: Parenchymal Echotexture: Markedly heterogenous Isthmus: Enlarged measuring 0.2 cm in diameter Right lobe: Enlarged measuring 5.8 x 3.3 x 2.6 cm Left lobe: Enlarged measures 6.1 x 2.7 x 3.1 cm _________________________________________________________ Estimated total number of nodules >/= 1 cm: 1 Number of spongiform nodules >/=  2 cm not described below (TR1): 0 Number of mixed cystic and solid nodules >/= 1.5 cm not described below (TR2): 0 _________________________________________________________ Loraine Maple approximately 1.2 x 1.0 x 0.8 and approximately 0.8 x 0.7 x 0.7 cm hyperechoic nodules within the right lobe of the thyroid are both favored to represent pseudo nodules given background heterogeneity of the thyroid gland, however regardless do not  meet imaging criteria to recommend percutaneous sampling or continued dedicated follow-up. Redemonstrated hypoechoic likely cystic left-sided  cervical lymph nodes with dominant lymph node measuring 1.7 cm in greatest short axis diameter (image 69). IMPRESSION: 1. Enlarged and markedly heterogeneous appearing thyroid gland without discrete worrisome thyroid nodule or mass. Findings are nonspecific though could be seen in the setting of a thyroiditis. 2. Cystic, potentially suppurative or necrotic left cervical lymph nodes as demonstrated on preceding contrast-enhanced neck CT. The above is in keeping with the ACR TI-RADS recommendations - J Am Coll Radiol 2017;14:587-595. Electronically Signed   By: Sandi Mariscal M.D.   On: 02/20/2018 18:43   [place] Fna Soft Tissue  Result Date: 02/21/2018 INDICATION: Cystic/centrally necrotic left cervical lymphadenopathy. Please perform ultrasound-guided biopsy for tissue diagnostic purposes. EXAM: ULTRASOUND GUIDED LEFT CERVICAL LYMPH NODE ASPIRATION AND CORE NEEDLE BIOPSY COMPARISON:  Neck CT - 02/20/2018; thyroid ultrasound - 02/20/2018 MEDICATIONS: None ANESTHESIA/SEDATION: None COMPLICATIONS: None immediate. TECHNIQUE: Informed written consent was obtained from the patient after a discussion of the risks, benefits and alternatives to treatment. Questions regarding the procedure were encouraged and answered. Initial ultrasound scanning demonstrated an approximately 2.3 cm anechoic lymph node within the left mid neck correlating with the dominant left-sided cervical lymph node seen on preceding contrast-enhanced neck CT image 53, series 2. An ultrasound image was saved for documentation purposes. The procedure was planned. A timeout was performed prior to the initiation of the procedure. The operative was prepped and draped in the usual sterile fashion, and a sterile drape was applied covering the operative field. A timeout was performed prior to the initiation of the procedure. Local anesthesia was provided with 1% lidocaine with epinephrine. Under direct ultrasound guidance, an 18 gauge needle was advanced into the  central aspect of the anechoic lymph node. Despite appropriate needle positioning, only a trace (approximately 0.5 cc) of fluid was able to be aspirated from the lymph node. As such, the decision was made to proceed with ultrasound-guided core needle biopsy. Under direct ultrasound guidance, an 18 gauge core needle device was utilized to obtain to obtain 6 core needle biopsies of the anechoic left-sided cervical lymph node. The samples were placed in saline and submitted to pathology. The needle was removed and hemostasis was achieved with manual compression. Post procedure scan was negative for significant hematoma. A dressing was placed. The patient tolerated the procedure well without immediate postprocedural complication. IMPRESSION: Technically successful ultrasound guided aspiration of approximately 0.5 cc of fluid from the dominant left-sided anechoic cervical lymph node followed by the acquisition of 6 core needle biopsy samples. Electronically Signed   By: Sandi Mariscal M.D.   On: 02/21/2018 14:16   [place] Ekg Site Rite  Result Date: 02/22/2018 If Site Rite image not attached, placement could not be confirmed due to current cardiac rhythm.   Former smoker - anx attacks started after that   DM was diagnosed in the hospital  No doctor visit in years  Was having frequent urination and thirst Lab Results  Component Value Date   HGBA1C 10.6 (H) 02/21/2018  put her on novolog 70/30  12 units before breakfast and dinner  200s to 300s for the most part  Has had a hard time eating due to severe anxiety  Egg/ bread/salads  No more fast food or white potatoes -- she has done a lot of research on diet and the dietician talked to her in the hospital   BP Readings from Last 3 Encounters:  03/06/18 126/72  02/23/18 (!) 167/97        Lab Results  Component Value Date   CREATININE 0.46 02/22/2018   BUN 7 02/22/2018   NA 136 02/22/2018   K 4.1 02/22/2018   CL 105 02/22/2018   CO2 22 02/22/2018     Lab Results  Component Value Date   WBC 12.8 (H) 02/23/2018   HGB 13.1 02/23/2018   HCT 37.8 02/23/2018   MCV 87.2 02/23/2018   PLT 401 02/23/2018    Lab Results  Component Value Date   ALT 16 02/22/2018   AST 17 02/22/2018   ALKPHOS 101 02/22/2018   BILITOT 0.5 02/22/2018     No insurance  Husband is working-no 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 Dental disease 03/07/2018  . 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 Neck infection 03/06/2018  . Hypertension 03/06/2018  . Panic disorder 03/06/2018  . 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 Diabetes type 2, uncontrolled ([place]) 03/06/2018  . Hypothyroidism 10/05/2008  . STRESS REACTION, ACUTE, WITH EMOTIONAL DISTURBANCE 10/05/2008  . HYPERCHOLESTEROLEMIA 01/15/2008  . Former smoker 01/15/2008  . ALLERGIC RHINITIS, SEASONAL 01/15/2008  . DYSMENORRHEA 01/15/2008  . 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 History of chickenpox   . History of diabetes mellitus, type II   . History of hypertension   . Hypertension    Past Surgical History:  Procedure Laterality Date  . APPENDECTOMY  1990  . BACK SURGERY     Social History   Tobacco Use  . Smoking status: Former Research scientist (life sciences)  . Smokeless tobacco: Never Used  Substance Use Topics  . Alcohol use: Not Currently    Frequency: Never  . Drug use: Never   Family History  Problem Relation Age of Onset  . COPD Mother   . Diabetes Mother   . Heart attack Mother   . Heart disease Mother   . Hypertension Mother   . Stroke Mother   . Heart  attack Father    Allergies  Allergen Reactions  . Penicillins Hives    Has patient had a PCN reaction causing immediate rash, facial/tongue/throat swelling, SOB or lightheadedness with hypotension: Unknown Has patient had a PCN reaction causing severe rash involving mucus membranes or skin necrosis: No Has patient had a PCN reaction that required hospitalization: No Has patient had a PCN reaction occurring within the last 10 years: No If all of the above answers are "NO", then may proceed with Cephalosporin use.   . Naproxen     REACTION: hives   Current Outpatient Medications on File Prior to Visit  Medication Sig Dispense Refill  . acetaminophen (TYLENOL) 500 MG tablet Take 500 mg by mouth every 6 (six) hours as needed for mild pain.    Marland Kitchen aspirin EC 81 MG tablet Take 81 mg by mouth daily.    . blood glucose meter kit and supplies KIT Dispense based on patient and insurance preference. Use up to four times daily as directed. (FOR  ICD-9 250.00, 250.01). 1 each 0  . cefTRIAXone (ROCEPHIN) IVPB Inject 2 g into the vein daily for 14 days. Indication:  Lemierre's syndrome neck abscess Last Day of Therapy:  5/4 Labs - Once weekly:  CBC/D and BMP, Labs - Every other week:  ESR and CRP 14 Units 0  . ELIQUIS STARTER PACK (ELIQUIS STARTER PACK) 5 MG TABS Take as directed on package: start with two-16m tablets twice daily for 7 days. On day 8, switch to one-551mtablet twice daily. 1 each 0  . lisinopril (PRINIVIL,ZESTRIL) 40 MG tablet Take 1 tablet (40 mg total) by mouth daily. 30 tablet 0  . metoprolol tartrate (LOPRESSOR) 50 MG tablet Take 1 tablet (50 mg total) by mouth 2 (two) times daily. 60 tablet 0  . metroNIDAZOLE (FLAGYL) 500 MG tablet Take 1 tablet (500 mg total) by mouth 3 (three) times daily for 14 days. 42 tablet 0   No current facility-administered medications on file prior to visit.      Review of Systems  Constitutional: Positive for fatigue. Negative for activity change,  appetite change, fever and unexpected weight change.  HENT: Positive for postnasal drip. Negative for congestion, ear pain, rhinorrhea, sinus pressure, sinus pain, sore throat, trouble swallowing and voice change.   Eyes: Negative for pain, redness and visual disturbance.  Respiratory: Negative for cough, chest tightness, shortness of breath, wheezing and stridor.   Cardiovascular: Negative for chest pain, palpitations and leg swelling.  Gastrointestinal: Negative for abdominal distention, abdominal pain, blood in stool, constipation, diarrhea and nausea.  Endocrine: Negative for polydipsia and polyuria.  Genitourinary: Negative for dysuria, frequency and urgency.  Musculoskeletal: Negative for arthralgias, back pain and myalgias.  Skin: Negative for pallor and rash.  Allergic/Immunologic: Negative for environmental allergies.  Neurological: Negative for dizziness, seizures, syncope, speech difficulty, light-headedness, numbness and headaches.  Hematological: Negative for adenopathy. Does not bruise/bleed easily.  Psychiatric/Behavioral: Negative for decreased concentration, dysphoric mood, self-injury, sleep disturbance and suicidal ideas. The patient is nervous/anxious.        Objective:   Physical Exam  Constitutional: She appears well-developed and well-nourished. No distress.  overwt and well appearing   HENT:  Head: Normocephalic and atraumatic.  Right Ear: External ear normal.  Left Ear: External ear normal.  Nose: Nose normal.  Mouth/Throat: Oropharynx is clear and moist.  Dental disease noted diffusely with some tooth loss   Eyes: Pupils are equal, round, and reactive to light. Conjunctivae and EOM are normal. Right eye exhibits no discharge. Left eye exhibits no discharge. No scleral icterus.  Neck: Normal range of motion. Neck supple. No JVD present. Carotid bruit is not present. No tracheal deviation present. No thyromegaly present.  Neck mass on L is much improved (per  record review)  No bruits heard  Diffuse thyroid enlargement   Cardiovascular: Normal rate, regular rhythm, normal heart sounds and intact distal pulses. Exam reveals no gallop.  No swelling of UEs  Pulmonary/Chest: Effort normal and breath sounds normal. No respiratory distress. She has no wheezes. She has no rales.  No crackles  Good air exch   Abdominal: Soft. Bowel sounds are normal. She exhibits no distension, no abdominal bruit and no mass. There is no tenderness.  Musculoskeletal: She exhibits no edema or deformity.  Lymphadenopathy:    She has no cervical adenopathy.  Neurological: She is alert. She has normal reflexes. She displays normal reflexes. No cranial nerve deficit. She exhibits normal muscle tone. Coordination normal.  Skin: Skin is warm and dry.  Capillary refill takes less than 2 seconds. No rash noted.  Ruddy complexion  No rash   Psychiatric: She has a normal mood and affect. Her behavior is normal. Judgment and thought content normal.  Pt c/o of anxiety attacks  Cheerful today  Talks about sympotms candidly          Assessment & Plan:   Problem List Items Addressed This Visit      Cardiovascular and Mediastinum   Hypertension    bp in fair control at this time  BP Readings from Last 1 Encounters:  03/06/18 126/72   No changes needed Disc lifstyle change with low sodium diet and exercise  Pt is new to me and started bp medicine with recent hospitalization  Lisinopril 40 mg (also for renal protection)  Metoprolol 50 mg bid  Pulse Rate: 95        Relevant Medications   apixaban (ELIQUIS) 5 MG TABS tablet   Jugular vein thrombosis    In the setting of L neck abscess and recent sepsis  Suspect mass effect of the abscess caused clot most likely Reviewed hospital records, lab results and studies in detail   Tolerating eliquis  Anticipate a 3 mo course No known hx of malignancy or hypercoag state -unsure if w/u has been done (no medical care for 52  y) and no insurance Pt will look into aide for medication cost  Symptoms are much improved  F/u 3 weeks       Relevant Medications   apixaban (ELIQUIS) 5 MG TABS tablet     Digestive   Dental disease    Suspect this is the cause of the neck abscess  Pt has no insurance but needs to seek dental care Suggest checking out a dental school  Will likely need extractions         Endocrine   Diabetes type 2, uncontrolled ([place])    This was discovered while hospitalized for neck abscess and likely worsened by infection Reviewed hospital records, lab results and studies in detail   Currently using nofolog 70/30 12 units bid with meals Will inc this to 17 units as tolerated with qid blood glucose checks  Unsure w/o insurance if she will be able to afford different medications in the future  Has had limited DM ed in the hospital and is changing diet- low glycemic with goal of wt loss (bmi of 29) Disc eye and foot care Will need eye exam when she can afford it  Lab Results  Component Value Date   HGBA1C 10.6 (H) 02/21/2018   Expect improvement with med/lifestyle change and resolution of infection  F/u 3 weeks with glucose readings  Will need to assess cholesterol and discuss statin as well       Relevant Medications   insulin aspart protamine- aspart (NOVOLOG MIX 70/30) (70-30) 100 UNIT/ML injection   Hypothyroidism    Newly diagnosed in the hospital  Lab Results  Component Value Date   TSH 17.867 (H) 02/21/2018   will begin levothyroxine 50 mcg daily  Can re check at f/u  Enlarged thyroid noted on CT and [place]  Per thyroid US report: IMPRESSION: 1. Enlarged and markedly heterogeneous appearing thyroid gland without discrete worrisome thyroid nodule or mass. Findings are nonspecific though could be seen in the setting of a thyroiditis. Will continue to follow  F/u 3 wk Reviewed hospital records, lab results and studies in detail        Relevant Medications   levothyroxine  (  SYNTHROID, LEVOTHROID) 57 MCG tablet   Thyromegaly    On [place] in hospital enlarged thyroid w/o worrisome features or mass  Will begin tx with levothyroxine  Lab Results  Component Value Date   TSH 17.867 (H) 02/21/2018    Follow closely       Relevant Medications   levothyroxine (SYNTHROID, LEVOTHROID) 50 MCG tablet     Other   Former smoker    Quit 5.5 y ago Commended       HYPERCHOLESTEROLEMIA    Low HDL of 26 High trig of 324  LDL calc of 83  In setting of DM should start statin in the future  Unsure if she can afford it  Disc diet  Goal LDL under 70 Prior smoker  Will disc further at f/u       Relevant Medications   apixaban (ELIQUIS) 5 MG TABS tablet   Neck infection - Primary    With sepsis and abscess likely due to dental infection  Complicated hospitalization and currently sees ID Reviewed hospital records, lab results and studies in detail   Sepsis -pos bc with strep viridans  Echocardiogram reassuring  Will finish rocephin and flagyl by PICC line this weekend and transition to augmentin  Enc her to get dental care - but without insurance unsure if she will be able to  F/u 3 wk      RESOLVED: Neck mass   Panic disorder    5.5 years after quitting smoking  After 10 y of no health care and s/p complex hosp for neck abscess and sepsis   Trial of paxil low dose 10 mg  Discussed expectations of SSRI medication including time to effectiveness and mechanism of action, also poss of side effects (early and late)- including mental fuzziness, weight or appetite change, nausea and poss of worse dep or anxiety (even suicidal thoughts)  Pt voiced understanding and will stop med and update if this occurs   Counseling may be valuable in the future -unsure if able w/o insurance F/u 3 mo       Relevant Medications   PARoxetine (PAXIL) 10 MG tablet

## 2018-03-06 NOTE — Assessment & Plan Note (Addendum)
5.5 years after quitting smoking  After 10 y of no health care and s/p complex hosp for neck abscess and sepsis   Trial of paxil low dose 10 mg  Discussed expectations of SSRI medication including time to effectiveness and mechanism of action, also poss of side effects (early and late)- including mental fuzziness, weight or appetite change, nausea and poss of worse dep or anxiety (even suicidal thoughts)  Pt voiced understanding and will stop med and update if this occurs   Counseling may be valuable in the future -unsure if able w/o insurance F/u 3 mo

## 2018-03-06 NOTE — Patient Instructions (Addendum)
Try to get most of your carbohydrates from produce (with the exception of white potatoes)  Eat less bread/pasta/rice/snack foods/cereals/sweets and other items from the middle of the grocery store (processed carbs)    Start looking for a dentist - ? Dental school is a good option   Also look into medicaid / employment -options for insurance   Will aim for 3 months of eliquis   Increase your insulin by 5 units (to 17 units twice daily)  If low glucose - alert [place]   Start levothyroxine for hypothyroidism - one daily    Follow up with me in 3  weeks

## 2018-03-07 ENCOUNTER — Encounter: Payer: Self-pay | Admitting: Family Medicine

## 2018-03-07 DIAGNOSIS — K089 Disorder of teeth and supporting structures, unspecified: Secondary | ICD-10-CM | POA: Insufficient documentation

## 2018-03-07 DIAGNOSIS — R911 Solitary pulmonary nodule: Secondary | ICD-10-CM | POA: Insufficient documentation

## 2018-03-07 DIAGNOSIS — I82890 Acute embolism and thrombosis of other specified veins: Secondary | ICD-10-CM | POA: Insufficient documentation

## 2018-03-07 NOTE — Assessment & Plan Note (Signed)
Suspect this is the cause of the neck abscess  Pt has no insurance but needs to seek dental care Suggest checking out a dental school  Will likely need extractions

## 2018-03-07 NOTE — Assessment & Plan Note (Addendum)
In the setting of L neck abscess and recent sepsis  Suspect mass effect of the abscess caused clot most likely Reviewed hospital records, lab results and studies in detail   Tolerating eliquis  Anticipate a 3 mo course No known hx of malignancy or hypercoag state -unsure if w/u has been done (no medical care for 10 y) and no insurance Pt will look into aide for medication cost  Symptoms are much improved  F/u 3 weeks

## 2018-03-07 NOTE — Assessment & Plan Note (Signed)
bp in fair control at this time  BP Readings from Last 1 Encounters:  03/06/18 126/72   No changes needed Disc lifstyle change with low sodium diet and exercise  Pt is new to me and started bp medicine with recent hospitalization  Lisinopril 40 mg (also for renal protection)  Metoprolol 50 mg bid  Pulse Rate: 95

## 2018-03-07 NOTE — Assessment & Plan Note (Signed)
With sepsis and abscess likely due to dental infection  Complicated hospitalization and currently sees ID Reviewed hospital records, lab results and studies in detail   Sepsis -pos bc with strep viridans  Echocardiogram reassuring  Will finish rocephin and flagyl by PICC line this weekend and transition to augmentin  Enc her to get dental care - but without insurance unsure if she will be able to  F/u 3 wk

## 2018-03-07 NOTE — Discharge Summary (Signed)
[facility] [place] NAME: Pam Patel    MR#:  [id number]  DATE OF BIRTH:  12/09/1960  DATE OF ADMISSION:  02/20/2018 ADMITTING PHYSICIAN: Nicholes Mango, MD  DATE OF DISCHARGE: 02/23/2018  4:45 PM  PRIMARY CARE PHYSICIAN: Tower, Wynelle Fanny, MD   ADMISSION DIAGNOSIS:  Thyroid nodule [E04.1] Neck mass [R22.1] NSTEMI (non-ST elevated myocardial infarction) ([place]) [I21.4] Chest pain, unspecified type [R07.9]  DISCHARGE DIAGNOSIS:  Active Problems:   Neck mass   SECONDARY DIAGNOSIS:   Past Medical History:  Diagnosis Date  . History of chickenpox   . History of diabetes mellitus, type II   . History of hypertension   . Hypertension      ADMITTING HISTORY  HISTORY OF PRESENT ILLNESS:  Pam Patel  is a 57 y.o. female with a known history of hypothyroidism, stopped taking  Synthroid, essential hypertension is presenting to the ED with a chief complaint of chest pain and left-sided neck swelling.  Patient was reporting 1 month history of toothache and noticed left neck swelling 4-5 days ago with intermittent episodes of pain.  Reports approximately 20 pounds weight loss in the past 1 year but for the past 3 months he has been following diet came into the ED with.  Initial troponin is  less than 0.03 and repeat troponin troponin  0.18.  CT of the neck with contrast has revealed bulky left neck adenopathy centrally necrotic most commonly experiences associated with metastatic squamous cell carcinoma but in the setting of elevated white count it could be inflammatory or infectious.  Right upper lobe spiculated lung nodule and a thrombosed left jugular vein and thyromegaly.  Hospitalist team is called to admit the patient and patient is started on broad-spectrum IV antibiotics and  heparin drip.  During my examination patient is denying any chest pain or shortness of breath some discomfort in the left side of the neck.  No other  complaints     HOSPITAL COURSE:   * Elevated troponin.  Likely demand ischemia.  Echocardiogram with no wall motion abnormalities.  Discussed with Dr. Nehemiah Massed of cardiology . Outpatient follow-up for stress test.  We will start aspirin.  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  PICC line placed as per infectious disease. Seen by ENT.  IR directed drain of the abscess. Appreciate ENT and ID input Discharge home on IV ceftriaxone 2 g daily and Flagyl 500 mg oral 3 times a day.  2 weeks of antibiotics.  May have to be extended when she follows with infectious disease as outpatient.  *Right lung spiculated nodule.  Will need follow-up CT scan in 3-6 months as outpatient.  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  Vascular surgery consulted.  *Accelerated hypertension. Started lisinopril 40 mg daily and metoprolol twice daily.  Blood pressure much improved.  Outpatient PCP follow-up.  *New onset diabetes mellitus. Patient is being discharged on 70/30 insulin twice daily.  Stable for discharge home.   CONSULTS OBTAINED:  Treatment Team:  Lequita Asal, MD Leonel Ramsay, MD Corey Skains, MD Schnier, Dolores Lory, MD  DRUG ALLERGIES:   Allergies  Allergen Reactions  . Penicillins Hives    Has patient had a PCN reaction causing immediate rash, facial/tongue/throat swelling, SOB or lightheadedness with hypotension: Unknown Has patient had a PCN reaction causing severe rash involving mucus membranes or skin necrosis: No Has patient had a PCN reaction that required hospitalization: No Has patient had a  PCN reaction occurring within the last 10 years: No If all of the above answers are "NO", then may proceed with Cephalosporin use.   . Naproxen     REACTION: hives    DISCHARGE MEDICATIONS:   Allergies as of 02/23/2018      Reactions   Penicillins Hives   Has patient had a PCN reaction causing immediate rash, facial/tongue/throat swelling, SOB  or lightheadedness with hypotension: Unknown Has patient had a PCN reaction causing severe rash involving mucus membranes or skin necrosis: No Has patient had a PCN reaction that required hospitalization: No Has patient had a PCN reaction occurring within the last 10 years: No If all of the above answers are "NO", then may proceed with Cephalosporin use.   Naproxen    REACTION: hives      Medication List    TAKE these medications   acetaminophen 500 MG tablet Commonly known as:  TYLENOL Take 500 mg by mouth every 6 (six) hours as needed for mild pain.   aspirin EC 81 MG tablet Take 81 mg by mouth daily.   blood glucose meter kit and supplies Kit Dispense based on patient and insurance preference. Use up to four times daily as directed. (FOR ICD-9 250.00, 250.01).   cefTRIAXone IVPB Commonly known as:  ROCEPHIN Inject 2 g into the vein daily for 14 days. Indication:  Lemierre's syndrome neck abscess Last Day of Therapy:  5/4 Labs - Once weekly:  CBC/D and BMP, Labs - Every other week:  ESR and CRP   ELIQUIS STARTER PACK 5 MG Tabs Take as directed on package: start with two-73m tablets twice daily for 7 days. On day 8, switch to one-580mtablet twice daily. Notes to patient:  start with two-44m37mablets twice daily for 7 days. On day 8, switch to one-44mg67mblet twice daily.    lisinopril 40 MG tablet Commonly known as:  PRINIVIL,ZESTRIL Take 1 tablet (40 mg total) by mouth daily.   metoprolol tartrate 50 MG tablet Commonly known as:  LOPRESSOR Take 1 tablet (50 mg total) by mouth 2 (two) times daily.   metroNIDAZOLE 500 MG tablet Commonly known as:  FLAGYL Take 1 tablet (500 mg total) by mouth 3 (three) times daily for 14 days.     ASK your doctor about these medications   insulin starter kit- syringes Misc 1 kit by Other route once for 1 dose. Ask about: Should I take this medication?            Home Infusion Instuctions  (From admission, onward)        Start      Ordered   02/23/18 0000  Home infusion instructions [facility] May follow [facility] Protocol; May administer Cathflo as needed to maintain patency of vascular access device.; Flushing of vascular access device: [facility]: 0.9% NaCl pre/post medica...    Question Answer Comment  Instructions May follow [facility] Protocol   Instructions May administer Cathflo as needed to maintain patency of vascular access device.   Instructions Flushing of vascular access device: per [facility]: 0.9% NaCl pre/post medication administration and prn patency; Heparin 100 u/ml, 44ml 39m implanted ports and Heparin 10u/ml, 44ml f60mall other central venous catheters.   Instructions May follow [facility] Anaphylaxis Protocol for First Dose Administration in the home: 0.9% NaCl at 25-50 ml/hr to maintain IV access for protocol meds. Epinephrine 0.3 ml IV/IM PRN and Benadryl 25-50 IV/IM PRN s/s of anaphylaxis.   Instructions  [facility] Infusion Coordinator (RN) to assist per patient IV care needs in the home PRN.      02/23/18 1104      Today   VITAL SIGNS:  Blood pressure (!) 167/97, pulse 82, temperature 97.9 F (36.6 C), temperature source Oral, resp. rate 18, height _0  (1.676 m), weight 82.6 kg (182 lb), SpO2 97 %.  I/O:  No intake or output data in the 24 hours ending 03/07/18 1203  PHYSICAL EXAMINATION:  Physical Exam  GENERAL:  57 y.o.-year-old patient lying in the bed with no acute distress.  LUNGS: Normal breath sounds bilaterally, no wheezing, rales,rhonchi or crepitation. No use of accessory muscles of respiration.  CARDIOVASCULAR: S1, S2 normal. No murmurs, rubs, or gallops.  ABDOMEN: Soft, non-tender, non-distended. Bowel sounds present. No organomegaly or mass.  NEUROLOGIC: Moves all 4 extremities. PSYCHIATRIC: The patient is alert and oriented x 3.  SKIN: No obvious rash, lesion, or ulcer.   DATA REVIEW:   CBC No results for input(s): WBC, HGB,  HCT, PLT in the last 168 hours.  Chemistries  No results for input(s): NA, K, CL, CO2, GLUCOSE, BUN, CREATININE, CALCIUM, MG, AST, ALT, ALKPHOS, BILITOT in the last 168 hours.  Invalid input(s): GFRCGP  Cardiac Enzymes No results for input(s): TROPONINI in the last 168 hours.  Microbiology Results  Results for orders placed or performed during the hospital encounter of 02/20/18  Blood Culture (routine x 2)     Status: None   Collection Time: 02/20/18  5:23 PM  Result Value Ref Range Status   Specimen Description BLOOD RIGHT [facility]  Final   Special Requests   Final    BOTTLES DRAWN AEROBIC AND ANAEROBIC Blood Culture adequate volume   Culture   Final    NO GROWTH 5 DAYS Performed at [facility], [address] [place], [place] [address]    Report Status 02/25/2018 FINAL  Final  Blood Culture (routine x 2)     Status: Abnormal   Collection Time: 02/20/18  5:28 PM  Result Value Ref Range Status   Specimen Description   Final    BLOOD LEFT ANTECUBITAL Performed at [facility], [place] [address], [place], [place] [address]    Special Requests   Final    BOTTLES DRAWN AEROBIC AND ANAEROBIC Blood Culture adequate volume Performed at [facility], [address] [place], [place] [address]    Culture  Setup Time   Final    GRAM POSITIVE COCCI IN BOTH AEROBIC AND ANAEROBIC BOTTLES CRITICAL RESULT CALLED TO, READ BACK BY AND VERIFIED WITH: NATHAN COOKSON _1  02/22/18 AKT Performed at [facility], [address] [place], [address] [address]    Culture (A)  Final    VIRIDANS STREPTOCOCCUS THE SIGNIFICANCE OF ISOLATING THIS ORGANISM FROM A SINGLE SET OF BLOOD CULTURES WHEN MULTIPLE SETS ARE DRAWN IS UNCERTAIN. PLEASE NOTIFY THE MICROBIOLOGY DEPARTMENT WITHIN ONE WEEK IF SPECIATION AND SENSITIVITIES ARE REQUIRED. Performed at [facility], [place] [address] [place], [place] [address]    Report Status 02/25/2018 FINAL  Final  Blood Culture ID Panel  (Reflexed)     Status: Abnormal   Collection Time: 02/20/18  5:28 PM  Result Value Ref Range Status   Enterococcus species NOT DETECTED NOT DETECTED Final   Listeria monocytogenes NOT DETECTED NOT DETECTED Final   Staphylococcus species NOT DETECTED NOT DETECTED Final   Staphylococcus aureus NOT DETECTED NOT DETECTED Final   Streptococcus species DETECTED (A)  NOT DETECTED Final    Comment: Not Enterococcus species, Streptococcus agalactiae, Streptococcus pyogenes, or Streptococcus pneumoniae. RBV NATHAN COOKSON _0  02/22/18 AKT    Streptococcus agalactiae NOT DETECTED NOT DETECTED Final   Streptococcus pneumoniae NOT DETECTED NOT DETECTED Final   Streptococcus pyogenes NOT DETECTED NOT DETECTED Final   Acinetobacter baumannii NOT DETECTED NOT DETECTED Final   Enterobacteriaceae species NOT DETECTED NOT DETECTED Final   Enterobacter cloacae complex NOT DETECTED NOT DETECTED Final   Escherichia coli NOT DETECTED NOT DETECTED Final   Klebsiella oxytoca NOT DETECTED NOT DETECTED Final   Klebsiella pneumoniae NOT DETECTED NOT DETECTED Final   Proteus species NOT DETECTED NOT DETECTED Final   Serratia marcescens NOT DETECTED NOT DETECTED Final   Haemophilus influenzae NOT DETECTED NOT DETECTED Final   Neisseria meningitidis NOT DETECTED NOT DETECTED Final   Pseudomonas aeruginosa NOT DETECTED NOT DETECTED Final   Candida albicans NOT DETECTED NOT DETECTED Final   Candida glabrata NOT DETECTED NOT DETECTED Final   Candida krusei NOT DETECTED NOT DETECTED Final   Candida parapsilosis NOT DETECTED NOT DETECTED Final   Candida tropicalis NOT DETECTED NOT DETECTED Final    Comment: Performed at [facility], [address] [place], [place] [address]  Aerobic Culture (superficial specimen)     Status: None   Collection Time: 02/21/18 12:31 PM  Result Value Ref Range Status   Specimen Description   Final    NEEDLE ASPIRATE Performed at [facility], [address], [place], [place] [address]    Special Requests   Final    NONE Performed at [facility], [address] [place], [place] [address]    Gram Stain   Final    MODERATE WBC PRESENT, PREDOMINANTLY MONONUCLEAR NO ORGANISMS SEEN    Culture   Final    NO GROWTH 2 DAYS Performed at [facility] [address] [place], [place] [address]    Report Status 02/24/2018 FINAL  Final  Aerobic/Anaerobic Culture (surgical/deep wound)     Status: None   Collection Time: 02/21/18 12:36 PM  Result Value Ref Range Status   Specimen Description TISSUE  Final   Special Requests LEFT LYMPH NODE BX  Final   Gram Stain   Final    RARE WBC PRESENT, PREDOMINANTLY MONONUCLEAR NO ORGANISMS SEEN    Culture   Final    No growth aerobically or anaerobically. Performed at [facility], [place] [address] [place], [place] [address]    Report Status 02/26/2018 FINAL  Final    RADIOLOGY:  No results found.  Follow up with PCP in 1 week.  Management plans discussed with the patient, family and they are in agreement.  CODE STATUS:  Code Status History    Date Active Date Inactive Code Status Order ID Comments User Context   02/20/2018 1951 02/23/2018 2005 Full Code [id number]  Nicholes Mango, MD Inpatient      TOTAL TIME TAKING CARE OF THIS PATIENT ON DAY OF DISCHARGE: more than 30 minutes.   Leia Alf Owen Pratte M.D on 03/07/2018 at 12:03 PM  Between 7am to 6pm - Pager - [phone]  After 6pm go to [email or web address] - password [email or web address] [facility]  Office  [phone]  CC: Primary care physician; Tower, Wynelle Fanny, MD  Note: This dictation was prepared with Dragon dictation along with smaller phrase technology. Any transcriptional errors that result from this process are unintentional.

## 2018-03-07 NOTE — Assessment & Plan Note (Signed)
On [place] in hospital enlarged thyroid w/o worrisome features or mass  Will begin tx with levothyroxine  Lab Results  Component Value Date   TSH 17.867 (H) 02/21/2018    Follow closely

## 2018-03-07 NOTE — Assessment & Plan Note (Addendum)
Newly diagnosed in the hospital  Lab Results  Component Value Date   TSH 17.867 (H) 02/21/2018   will begin levothyroxine 50 mcg daily  Can re check at f/u  Enlarged thyroid noted on CT and [place]  Per thyroid US report: IMPRESSION: 1. Enlarged and markedly heterogeneous appearing thyroid gland without discrete worrisome thyroid nodule or mass. Findings are nonspecific though could be seen in the setting of a thyroiditis. Will continue to follow  F/u 3 wk Reviewed hospital records, lab results and studies in detail

## 2018-03-07 NOTE — Assessment & Plan Note (Signed)
This was discovered while hospitalized for neck abscess and likely worsened by infection Reviewed hospital records, lab results and studies in detail   Currently using nofolog 70/30 12 units bid with meals Will inc this to 17 units as tolerated with qid blood glucose checks  Unsure w/o insurance if she will be able to afford different medications in the future  Has had limited DM ed in the hospital and is changing diet- low glycemic with goal of wt loss (bmi of 29) Disc eye and foot care Will need eye exam when she can afford it  Lab Results  Component Value Date   HGBA1C 10.6 (H) 02/21/2018   Expect improvement with med/lifestyle change and resolution of infection  F/u 3 weeks with glucose readings  Will need to assess cholesterol and discuss statin as well

## 2018-03-07 NOTE — Assessment & Plan Note (Signed)
Quit 5.5 y ago Commended

## 2018-03-07 NOTE — Assessment & Plan Note (Signed)
Low HDL of 26 High trig of 324  LDL calc of 83  In setting of DM should start statin in the future  Unsure if she can afford it  Disc diet  Goal LDL under 70 Prior smoker  Will disc further at f/u

## 2018-03-13 ENCOUNTER — Telehealth: Payer: Self-pay

## 2018-03-13 NOTE — Telephone Encounter (Signed)
At PCP's request, I have been investigating financial and medical care assistance options for patient.  Patient is in need of medication expense coverage including coverage for her insulin cost, has no insurance and also needs supportive dental and medical care.  After investigation, I have the following advice/contacts for patient:  1.  [facility] hospital)- They can assist with all of meds including costly meds such as insulin if patient qualifies.  (I have left message with the clinic to call me back and discuss enrollment process if patient agrees).  Current requirement is no insurance for 18-65 patients, which patient falls in this category.  Not aware of any other restrictions at this point.   2.  Adult Services Intake through [facility] - [phone] to assist patient with disability/Medicaid application and discuss dental resources for the indigent.    I have tried reaching patient today without any answer to share these resource options.  I will continue to reach out to patient to provide assistance.  FYI update to Dr. Milinda Antis.

## 2018-03-13 NOTE — Telephone Encounter (Signed)
Thanks so much-that is really helpful!

## 2018-03-15 ENCOUNTER — Ambulatory Visit: Payer: Self-pay | Admitting: Family Medicine

## 2018-03-16 ENCOUNTER — Encounter: Payer: Self-pay | Admitting: Infectious Diseases

## 2018-03-16 DIAGNOSIS — I808 Phlebitis and thrombophlebitis of other sites: Secondary | ICD-10-CM | POA: Insufficient documentation

## 2018-03-20 NOTE — Care Management (Signed)
Telephone conversation with Pam Patel. States that she could use some assistance with medications. Discussed [facility].  Gwenette Greet RN MSN CCM Care Management [phone]

## 2018-03-25 ENCOUNTER — Telehealth: Payer: Self-pay | Admitting: Family Medicine

## 2018-03-25 NOTE — Telephone Encounter (Signed)
Last refill for metoprolol tartrate 50 mg for #60 tabs was 02/23/18  0 refills, prescribed Dr. Elpidio Anis And lisinopril  40 mg for #30 tabs was 02/24/18 prescribed by Dr. Elpidio Anis  LOV  03/06/18 NOV  03/27/18  Dr. Gregary Cromer [address] [employer]

## 2018-03-25 NOTE — Telephone Encounter (Signed)
Copied from CRM [id number]. Topic: Quick Communication - Rx Refill/Question >> Mar 25, 2018 11:06 AM Alexander Bergeron B wrote: Medication: metoprolol tartrate (LOPRESSOR) 50 MG tablet [[id number]] , lisinopril (PRINIVIL,ZESTRIL) 40 MG tablet [[id number]]   Has the patient contacted their pharmacy? Yes.   (Agent: If no, request that the patient contact the pharmacy for the refill.) (Agent: If yes, when and what did the pharmacy advise?)  Preferred Pharmacy (with phone number or street name): [facility] in [place] on [address]   Agent: Please be advised that RX refills may take up to 3 business days. We ask that you follow-up with your pharmacy.

## 2018-03-27 ENCOUNTER — Encounter: Payer: Self-pay | Admitting: Family Medicine

## 2018-03-27 ENCOUNTER — Ambulatory Visit: Payer: Self-pay | Admitting: Family Medicine

## 2018-03-27 VITALS — BP 156/94 | HR 79 | Temp 98.6°F | Ht 65.5 in | Wt 184.0 lb

## 2018-03-27 DIAGNOSIS — K089 Disorder of teeth and supporting structures, unspecified: Secondary | ICD-10-CM

## 2018-03-27 DIAGNOSIS — E039 Hypothyroidism, unspecified: Secondary | ICD-10-CM

## 2018-03-27 DIAGNOSIS — E7800 Pure hypercholesterolemia, unspecified: Secondary | ICD-10-CM

## 2018-03-27 DIAGNOSIS — F410 Panic disorder [episodic paroxysmal anxiety] without agoraphobia: Secondary | ICD-10-CM

## 2018-03-27 DIAGNOSIS — I82890 Acute embolism and thrombosis of other specified veins: Secondary | ICD-10-CM

## 2018-03-27 DIAGNOSIS — I10 Essential (primary) hypertension: Secondary | ICD-10-CM

## 2018-03-27 DIAGNOSIS — E1165 Type 2 diabetes mellitus with hyperglycemia: Secondary | ICD-10-CM

## 2018-03-27 LAB — TSH: TSH: 2.6 u[IU]/mL (ref 0.35–4.50)

## 2018-03-27 MED ORDER — METOPROLOL TARTRATE 50 MG PO TABS: 50.0000 mg | ORAL_TABLET | Freq: Two times a day (BID) | ORAL | 11 refills | Status: DC

## 2018-03-27 MED ORDER — LISINOPRIL 40 MG PO TABS: 40.0000 mg | ORAL_TABLET | Freq: Every day | ORAL | 11 refills | Status: DC

## 2018-03-27 MED ORDER — PAROXETINE HCL 20 MG PO TABS: 20.0000 mg | ORAL_TABLET | Freq: Every day | ORAL | 11 refills | Status: DC

## 2018-03-27 NOTE — Progress Notes (Signed)
Subjective:    Patient ID: Pam Patel, female    DOB: 16-Feb-1961, 57 y.o.   MRN: [id number]  HPI Here for f/u of chronic medical problems   Still has days when she is tired  Overall feeling better  Painted a fence this week! 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  On eliquis   Last dose of abx recently  Neck is much much better  Released by ID   Sleeping better at night      DM2 novolog 70/30 17 units bid with meals  Had dm ed in hospital  Lab Results  Component Value Date   HGBA1C 10.6 (H) 02/21/2018  glucose readings were generally in low to mid 200s -now coming down to mid 100s  Few under 100 (when she drinks a boost for breakfast-gets that at lunch)  Eating better overall  Eggs/toast Chicken and fish  Broccoli  Avoiding pasta  Whole wheat bread only  Not a lot of fruit -some grapes and apples and oranges  Avoiding sweets  A little burning sens in feet on and off      Hypothyroid Lab Results  Component Value Date   TSH 17.867 (H) 02/21/2018   on levothy 50 mcg daily  Due for re check  Enl thyroid- rev her [place]   Hyperlipidemia Lab Results  Component Value Date   CHOL 174 02/21/2018   HDL 26 (L) 02/21/2018   LDLCALC 83 02/21/2018   LDLDIRECT 134.4 11/05/2008   TRIG 324 (H) 02/21/2018   CHOLHDL 6.7 02/21/2018   Needs to start statin in light of DM Wants to wait until next visit   Panic disorder/anx  Started paxil 10 mg last visit (takes it in the am -  does better with it at night)  Has had a few panic attacks  Would like to go up to 20  Less than there were  Also stress is lower/ also worried about her daughter with RA   Patient Active Problem List   Diagnosis Date Noted  . 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 Dental disease 03/07/2018  . 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 Lung nodule 03/07/2018  . Neck infection 03/06/2018  . Hypertension 03/06/2018  . Panic disorder 03/06/2018  . 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 Diabetes type 2, uncontrolled ([place]) 03/06/2018  . Hypothyroidism 10/05/2008  . STRESS REACTION, ACUTE, WITH EMOTIONAL DISTURBANCE 10/05/2008  . HYPERCHOLESTEROLEMIA 01/15/2008  . Former smoker 01/15/2008  . ALLERGIC RHINITIS, SEASONAL 01/15/2008  . DYSMENORRHEA 01/15/2008  . 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 History of chickenpox   . History of diabetes mellitus, type II   . History of hypertension   . Hypertension    Past Surgical History:  Procedure Laterality Date  . APPENDECTOMY  1990  . BACK SURGERY     Social  History   Tobacco Use  . Smoking status: Former Research scientist (life sciences)  . Smokeless tobacco: Never Used  Substance Use Topics  . Alcohol use: Not Currently    Frequency: Never  . Drug use: Never   Family History  Problem Relation Age of Onset  . COPD Mother   . Diabetes Mother   . Heart attack Mother   . Heart disease Mother   . Hypertension Mother   . Stroke Mother   . Heart attack Father    Allergies  Allergen Reactions  . Penicillins Hives    Has patient had a PCN reaction causing immediate rash, facial/tongue/throat swelling, SOB or lightheadedness with hypotension: Unknown Has patient had a PCN reaction causing severe rash involving mucus membranes or skin necrosis: No Has patient had a PCN reaction that required hospitalization: No Has patient had a PCN reaction occurring within the last 10 years: No If all of the above answers are "NO",  then may proceed with Cephalosporin use.   . Naproxen     REACTION: hives   Current Outpatient Medications on File Prior to Visit  Medication Sig Dispense Refill  . acetaminophen (TYLENOL) 500 MG tablet Take 500 mg by mouth every 6 (six) hours as needed for mild pain.    Marland Kitchen apixaban (ELIQUIS) 5 MG TABS tablet Take 1 tablet (5 mg total) by mouth 2 (two) times daily. 60 tablet 3  . aspirin EC 81 MG tablet Take 81 mg by mouth daily.    . blood glucose meter kit and supplies KIT Dispense based on patient and insurance preference. Use up to four times daily as directed. (FOR ICD-9 250.00, 250.01). 1 each 0  . ELIQUIS STARTER PACK (ELIQUIS STARTER PACK) 5 MG TABS Take as directed on package: start with two-37m tablets twice daily for 7 days. On day 8, switch to one-54mtablet twice daily. 1 each 0  . insulin aspart protamine- aspart (NOVOLOG MIX 70/30) (70-30) 100 UNIT/ML injection Inject 0.17 mLs (17 Units total) into the skin 2 (two) times daily with a meal. 10 mL 1  . levothyroxine (SYNTHROID, LEVOTHROID) 50 MCG tablet Take 1 tablet (50 mcg total) by mouth daily. In am 30 minutes before food or other medicines 30 tablet 3   No current facility-administered medications on file prior to visit.     Review of Systems  Constitutional: Negative for activity change, appetite change, fatigue, fever and unexpected weight change.  HENT: Negative for congestion, ear pain, rhinorrhea, sinus pressure and sore throat.   Eyes: Negative for pain, redness and visual disturbance.  Respiratory: Negative for cough, shortness of breath and wheezing.   Cardiovascular: Negative for chest pain and palpitations.  Gastrointestinal: Negative for abdominal pain, blood in stool, constipation and diarrhea.  Endocrine: Negative for polydipsia and polyuria.  Genitourinary: Negative for dysuria, frequency and urgency.  Musculoskeletal: Negative for arthralgias, back pain and myalgias.  Skin: Negative for pallor and rash.    Allergic/Immunologic: Negative for environmental allergies.  Neurological: Negative for dizziness, syncope and headaches.  Hematological: Negative for adenopathy. Does not bruise/bleed easily.  Psychiatric/Behavioral: Negative for decreased concentration and dysphoric mood. The patient is nervous/anxious.        Objective:   Physical Exam  Constitutional: She appears well-developed and well-nourished. No distress.  obese and well appearing   HENT:  Head: Normocephalic and atraumatic.  Mouth/Throat: Oropharynx is clear and moist.  Eyes: Pupils are equal, round, and reactive to light. Conjunctivae and EOM are normal.  Neck: Normal range of  motion. Neck supple. No JVD present. Carotid bruit is not present.  No mass in neck  Stable thyroid exam   Cardiovascular: Normal rate, regular rhythm, normal heart sounds and intact distal pulses. Exam reveals no gallop.  Pulmonary/Chest: Effort normal and breath sounds normal. No respiratory distress. She has no wheezes. She has no rales.  No crackles  Abdominal: Soft. Bowel sounds are normal. She exhibits no distension, no abdominal bruit and no mass. There is no tenderness.  Musculoskeletal: She exhibits no edema.  Lymphadenopathy:    She has no cervical adenopathy.  Neurological: She is alert. She has normal reflexes. She displays normal reflexes. No cranial nerve deficit or sensory deficit. She exhibits normal muscle tone. Coordination normal.  Skin: Skin is warm and dry. No rash noted.  Tanned Solar lentigines diffusely   Psychiatric: She has a normal mood and affect.  Pleasant Not anx today          Assessment & Plan:   Problem List Items Addressed This Visit      Cardiovascular and Mediastinum   Hypertension - Primary    bp is up today BP Readings from Last 1 Encounters:  03/27/18 (!) 156/94   No changes needed- just ran out of bp med Refilled and /fu planned Most recent labs reviewed  Disc lifstyle change with low sodium  diet and exercise        Relevant Medications   lisinopril (PRINIVIL,ZESTRIL) 40 MG tablet   metoprolol tartrate (LOPRESSOR) 50 MG tablet   Jugular vein thrombosis    No symptoms  Will remain on eliquis for 3 mo period      Relevant Medications   lisinopril (PRINIVIL,ZESTRIL) 40 MG tablet   metoprolol tartrate (LOPRESSOR) 50 MG tablet     Digestive   Dental disease    On wait list for [employer] [employer] for care         Endocrine   Diabetes type 2, uncontrolled ([place])    Improving with inc in insulin 70/30  Watch closely for hypoglycemia  Rev glucose results Diet discussed Alert if hypoglycemia and will ref dose A1C when due in mid summer Considering change to oral med if able       Relevant Medications   lisinopril (PRINIVIL,ZESTRIL) 40 MG tablet   Hypothyroidism    Now on 50 mcg of levothyroxine  TSH today      Relevant Medications   metoprolol tartrate (LOPRESSOR) 50 MG tablet   Other Relevant Orders   TSH (Completed)     Other   HYPERCHOLESTEROLEMIA    Will need to start stain in light of DM Disc goals for lipids and reasons to control them Rev last labs with pt Rev low sat fat diet in detail Wants to wait until next visit  No insurance       Relevant Medications   lisinopril (PRINIVIL,ZESTRIL) 40 MG tablet   metoprolol tartrate (LOPRESSOR) 50 MG tablet   Panic disorder    Some imp with paxil 10  Will adv to 20 mg Reviewed stressors/ coping techniques/symptoms/ support sources/ tx options and side effects in detail today Discussed expectations of SSRI medication including time to effectiveness and mechanism of action, also poss of side effects (early and late)- including mental fuzziness, weight or appetite change, nausea and poss of worse dep or anxiety (even suicidal thoughts)  Pt voiced understanding and will stop med and update if this occurs   F/u later in summer Disc imp of self care  Relevant Medications   PARoxetine (PAXIL)  20 MG tablet

## 2018-03-27 NOTE — Patient Instructions (Addendum)
If you have blood sugars under 70 on a regular basis please let me know   Am fasting - 120 or less  2 hours after a meal 140 or less  These are goals with wiggle room   Start walking when you are ready (outdoor work also counts as exercise)   Follow up in mid July   Increase paxil to 20 mg daily  If problems or issues let me know

## 2018-03-28 NOTE — Telephone Encounter (Signed)
Spoke with patient and gave her information resources below for supports.  Also, left her the [facility] Financial Assistance packet at the front desk.  She will pick up and complete at her earliest convenience.  Patient finds this information very helpful and knows she can call me back if she has any questions or needs anything from [place] to complete the application process.

## 2018-03-28 NOTE — Assessment & Plan Note (Signed)
On wait list for [facility] [employer] for care

## 2018-03-28 NOTE — Assessment & Plan Note (Signed)
bp is up today BP Readings from Last 1 Encounters:  03/27/18 (!) 156/94   No changes needed- just ran out of bp med Refilled and /fu planned Most recent labs reviewed  Disc lifstyle change with low sodium diet and exercise

## 2018-03-28 NOTE — Assessment & Plan Note (Signed)
Improving with inc in insulin 70/30  Watch closely for hypoglycemia  Rev glucose results Diet discussed Alert if hypoglycemia and will ref dose A1C when due in mid summer Considering change to oral med if able

## 2018-03-28 NOTE — Assessment & Plan Note (Signed)
Now on 50 mcg of levothyroxine  TSH today

## 2018-03-28 NOTE — Assessment & Plan Note (Signed)
No symptoms  Will remain on eliquis for 3 mo period

## 2018-03-28 NOTE — Assessment & Plan Note (Signed)
Will need to start stain in light of DM Disc goals for lipids and reasons to control them Rev last labs with pt Rev low sat fat diet in detail Wants to wait until next visit  No insurance

## 2018-03-28 NOTE — Assessment & Plan Note (Signed)
Some imp with paxil 10  Will adv to 20 mg Reviewed stressors/ coping techniques/symptoms/ support sources/ tx options and side effects in detail today Discussed expectations of SSRI medication including time to effectiveness and mechanism of action, also poss of side effects (early and late)- including mental fuzziness, weight or appetite change, nausea and poss of worse dep or anxiety (even suicidal thoughts)  Pt voiced understanding and will stop med and update if this occurs   F/u later in summer Disc imp of self care

## 2018-03-29 ENCOUNTER — Encounter: Payer: Self-pay | Admitting: *Deleted

## 2018-05-23 ENCOUNTER — Other Ambulatory Visit: Payer: Self-pay | Admitting: *Deleted

## 2018-05-23 MED ORDER — INSULIN ASPART PROT & ASPART (70-30 MIX) 100 UNIT/ML ~~LOC~~ SUSP: 17.0000 [IU] | Freq: Two times a day (BID) | SUBCUTANEOUS | 1 refills | Status: DC

## 2018-05-27 ENCOUNTER — Ambulatory Visit: Payer: Self-pay | Admitting: Family Medicine

## 2018-05-27 ENCOUNTER — Encounter: Payer: Self-pay | Admitting: Family Medicine

## 2018-05-27 VITALS — BP 155/90 | HR 68 | Ht 65.5 in | Wt 192.0 lb

## 2018-05-27 DIAGNOSIS — E039 Hypothyroidism, unspecified: Secondary | ICD-10-CM

## 2018-05-27 DIAGNOSIS — E7800 Pure hypercholesterolemia, unspecified: Secondary | ICD-10-CM

## 2018-05-27 DIAGNOSIS — F410 Panic disorder [episodic paroxysmal anxiety] without agoraphobia: Secondary | ICD-10-CM

## 2018-05-27 DIAGNOSIS — I82890 Acute embolism and thrombosis of other specified veins: Secondary | ICD-10-CM

## 2018-05-27 DIAGNOSIS — I10 Essential (primary) hypertension: Secondary | ICD-10-CM

## 2018-05-27 DIAGNOSIS — K089 Disorder of teeth and supporting structures, unspecified: Secondary | ICD-10-CM

## 2018-05-27 DIAGNOSIS — E1165 Type 2 diabetes mellitus with hyperglycemia: Secondary | ICD-10-CM

## 2018-05-27 LAB — CBC WITH DIFFERENTIAL/PLATELET
Basophils Absolute: 0.1 10*3/uL (ref 0.0–0.1)
Basophils Relative: 0.9 % (ref 0.0–3.0)
Eosinophils Absolute: 0.2 10*3/uL (ref 0.0–0.7)
Eosinophils Relative: 2.3 % (ref 0.0–5.0)
HCT: 43.6 % (ref 36.0–46.0)
Hemoglobin: 14.8 g/dL (ref 12.0–15.0)
LYMPHS ABS: 2.5 10*3/uL (ref 0.7–4.0)
Lymphocytes Relative: 29.4 % (ref 12.0–46.0)
MCHC: 34 g/dL (ref 30.0–36.0)
MCV: 88.4 fl (ref 78.0–100.0)
MONO ABS: 0.5 10*3/uL (ref 0.1–1.0)
Monocytes Relative: 5.4 % (ref 3.0–12.0)
Neutro Abs: 5.3 10*3/uL (ref 1.4–7.7)
Neutrophils Relative %: 62 % (ref 43.0–77.0)
Platelets: 301 10*3/uL (ref 150.0–400.0)
RBC: 4.93 Mil/uL (ref 3.87–5.11)
RDW: 13.5 % (ref 11.5–15.5)
WBC: 8.6 10*3/uL (ref 4.0–10.5)

## 2018-05-27 LAB — LIPID PANEL
CHOL/HDL RATIO: 5
Cholesterol: 201 mg/dL — ABNORMAL HIGH (ref 0–200)
HDL: 37.9 mg/dL — ABNORMAL LOW (ref >39.00)
LDL Cholesterol: 125 mg/dL — ABNORMAL HIGH (ref 0–99)
NonHDL: 163.32
Triglycerides: 191 mg/dL — ABNORMAL HIGH (ref 0.0–149.0)
VLDL: 38.2 mg/dL (ref 0.0–40.0)

## 2018-05-27 LAB — HEMOGLOBIN A1C: Hgb A1c MFr Bld: 7 % — ABNORMAL HIGH (ref 4.6–6.5)

## 2018-05-27 MED ORDER — AMLODIPINE BESYLATE 5 MG PO TABS: 5.0000 mg | ORAL_TABLET | Freq: Every day | ORAL | 3 refills | Status: DC

## 2018-05-27 NOTE — Assessment & Plan Note (Signed)
bp not optimally controlled  BP Readings from Last 1 Encounters:  05/27/18 (!) 155/90   Staking lisinopril and metoprolol Will add amlodipine 5 mg as tolerated/update if side effects  Enc DASH eating  Most recent labs reviewed  Disc lifstyle change with low sodium diet and exercise  She will check bp at home and get back to us

## 2018-05-27 NOTE — Assessment & Plan Note (Signed)
Doing better with inc paxil 20 mg  Reviewed stressors/ coping techniques/symptoms/ support sources/ tx options and side effects in detail today

## 2018-05-27 NOTE — Assessment & Plan Note (Signed)
Pt stopped eliquis (due to inability to afford it) just short of 3 mo planned course No symptoms  Doing well  Taking asa 81 mg daily

## 2018-05-27 NOTE — Assessment & Plan Note (Signed)
[facility] this [facility]

## 2018-05-27 NOTE — Progress Notes (Signed)
Subjective:    Patient ID: Pam Patel, female    DOB: 17-Jun-1961, 57 y.o.   MRN: [id number]  HPI Here for f/u of chronic medical problems   Wt Readings from Last 3 Encounters:  05/27/18 192 lb (87.1 kg)  03/27/18 184 lb (83.5 kg)  03/06/18 180 lb (81.6 kg)  she is eating less Eating a DM diet  Doing water aerobics 7 days per week  31.46 kg/m    bp is stable today  No cp or palpitations or headaches or edema  No side effects to medicines  BP Readings from Last 3 Encounters:  05/27/18 (!) 160/90  03/27/18 (!) 156/94  03/06/18 126/72    Lisinopril 40 Metoprolol 50 bid Still high even though she took her medicine  It is also high at home  She stopped the eliquis - due to inability to afford it  Almost to 3 months anyway- at this point  She inc her 81 mg asa to 2 per day Will go down to one   Would like to go back to work in the fall when done caring for her family   Has a dental appt next month  Pulse Readings from Last 3 Encounters:  05/27/18 68  03/27/18 79  03/06/18 95   DM2 Lab Results  Component Value Date   HGBA1C 10.6 (H) 02/21/2018  needs to be checked  Blood sugars are running much better overall  ams 80s-150s  Pms one teens to 160s    Once 206 , one 175  Noon- is her best - 80s - low 100s  No hypoglycemia except for one reading of 63 Due for labs today   Insulin is no longer affordable  Needs to change to metformin   Taking novolog 70/30   17 unit  bid with meals  Lab Results  Component Value Date   CREATININE 0.46 02/22/2018   BUN 7 02/22/2018   NA 136 02/22/2018   K 4.1 02/22/2018   CL 105 02/22/2018   CO2 22 02/22/2018   Lab Results  Component Value Date   ALT 16 02/22/2018   AST 17 02/22/2018   ALKPHOS 101 02/22/2018   BILITOT 0.5 02/22/2018   Lab Results  Component Value Date   WBC 12.8 (H) 02/23/2018   HGB 13.1 02/23/2018   HCT 37.8 02/23/2018   MCV 87.2 02/23/2018   PLT 401 02/23/2018      Hypothyroidism  Pt has  no clinical changes Has hx of enl thyroid  No change in energy level/ hair or skin/ edema and no tremor Lab Results  Component Value Date   TSH 2.60 03/27/2018     This normalized   Hyperlipidemia Lab Results  Component Value Date   CHOL 174 02/21/2018   HDL 26 (L) 02/21/2018   LDLCALC 83 02/21/2018   LDLDIRECT 134.4 11/05/2008   TRIG 324 (H) 02/21/2018   CHOLHDL 6.7 02/21/2018  aware needs to be on statin-but no insurance   Panic disorder Inc her paxil to 20 mg last time  It really helping    Patient Active Problem List   Diagnosis Date Noted  . 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 Dental disease 03/07/2018  . 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 Lung nodule 03/07/2018  . Neck infection 03/06/2018  . Hypertension 03/06/2018  . Panic disorder 03/06/2018  . 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 Diabetes type 2, uncontrolled ([place]) 03/06/2018  . Hypothyroidism 10/05/2008  . STRESS REACTION, ACUTE, WITH EMOTIONAL DISTURBANCE 10/05/2008  .  HYPERCHOLESTEROLEMIA 01/15/2008  . Former smoker 01/15/2008  . ALLERGIC RHINITIS, SEASONAL 01/15/2008  . DYSMENORRHEA 01/15/2008  . 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 History of chickenpox   . History of diabetes mellitus, type II   . History of hypertension   . Hypertension    Past Surgical History:  Procedure Laterality Date  . APPENDECTOMY  1990  . BACK SURGERY     Social History   Tobacco Use  . Smoking status: Former Research scientist (life sciences)  . Smokeless tobacco: Never Used  Substance Use Topics  . Alcohol use: Not Currently    Frequency: Never  . Drug use: Never   Family History  Problem Relation Age of Onset  . COPD Mother   . Diabetes Mother   . Heart attack Mother   . Heart disease Mother   . Hypertension Mother   . Stroke Mother   . Heart attack Father    Allergies  Allergen Reactions  . Penicillins Hives    Has patient had a PCN reaction causing immediate rash,  facial/tongue/throat swelling, SOB or lightheadedness with hypotension: Unknown Has patient had a PCN reaction causing severe rash involving mucus membranes or skin necrosis: No Has patient had a PCN reaction that required hospitalization: No Has patient had a PCN reaction occurring within the last 10 years: No If all of the above answers are "NO", then may proceed with Cephalosporin use.   . Naproxen     REACTION: hives   Current Outpatient Medications on File Prior to Visit  Medication Sig Dispense Refill  . acetaminophen (TYLENOL) 500 MG tablet Take 500 mg by mouth every 6 (six) hours as needed for mild pain.    Marland Kitchen aspirin EC 81 MG tablet Take 162 mg by mouth daily.     . blood glucose meter kit and supplies KIT Dispense based on patient and insurance preference. Use up to four times daily as directed. (FOR ICD-9 250.00, 250.01). 1 each 0  . insulin aspart protamine- aspart (NOVOLOG MIX 70/30) (70-30) 100 UNIT/ML injection Inject 0.17 mLs (17 Units total) into the skin 2 (two) times daily with a meal. 10 mL 1  . levothyroxine (SYNTHROID, LEVOTHROID) 50 MCG tablet Take 1 tablet (50 mcg total) by mouth daily. In am 30 minutes before food or other medicines 30 tablet 3  . lisinopril (PRINIVIL,ZESTRIL) 40 MG tablet Take 1 tablet (40 mg total) by mouth daily. 30 tablet 11  . metoprolol tartrate (LOPRESSOR) 50 MG tablet Take 1 tablet (50 mg total) by mouth 2 (two) times daily. 60 tablet 11  . PARoxetine (PAXIL) 20 MG tablet Take 1 tablet (20 mg total) by mouth daily. 30 tablet 11   No current facility-administered medications on file prior to visit.      Review of Systems  Constitutional: Positive for fatigue. Negative for activity change, appetite change, fever and unexpected weight change.       Overall feels much better Fatigue at times  HENT: Negative for congestion, ear pain, rhinorrhea, sinus pressure and sore throat.   Eyes: Negative for pain, redness and visual disturbance.    Respiratory: Negative for cough, shortness of breath and wheezing.   Cardiovascular: Negative for chest pain and palpitations.  Gastrointestinal: Negative for abdominal pain, blood in stool, constipation and diarrhea.  Endocrine: Negative for polydipsia and polyuria.  Genitourinary: Negative for dysuria, frequency and urgency.  Musculoskeletal: Negative for arthralgias, back pain and myalgias.  Skin: Negative for pallor  and rash.  Allergic/Immunologic: Negative for environmental allergies.  Neurological: Negative for dizziness, syncope and headaches.  Hematological: Negative for adenopathy. Does not bruise/bleed easily.  Psychiatric/Behavioral: Negative for decreased concentration and dysphoric mood. The patient is nervous/anxious.        Anxiety is much improved No panic attacks        Objective:   Physical Exam  Constitutional: She appears well-developed and well-nourished. No distress.  obese and well appearing   HENT:  Head: Normocephalic and atraumatic.  Mouth/Throat: Oropharynx is clear and moist.  Eyes: Pupils are equal, round, and reactive to light. Conjunctivae and EOM are normal. No scleral icterus.  Neck: Normal range of motion. Neck supple. No JVD present. Carotid bruit is not present. No tracheal deviation present. No thyromegaly present.  Cardiovascular: Normal rate, regular rhythm, normal heart sounds and intact distal pulses. Exam reveals no gallop.  Pulmonary/Chest: Effort normal and breath sounds normal. No respiratory distress. She has no wheezes. She has no rales.  No crackles Diffusely distant bs Overall good air exch  Abdominal: Soft. Bowel sounds are normal. She exhibits no distension, no abdominal bruit and no mass. There is no tenderness.  Musculoskeletal: She exhibits no edema or tenderness.  Lymphadenopathy:    She has no cervical adenopathy.  Neurological: She is alert. She has normal reflexes. She displays normal reflexes. No cranial nerve deficit. She  exhibits normal muscle tone. Coordination normal.  Skin: Skin is warm and dry. No rash noted. No pallor.  Psychiatric: She has a normal mood and affect. Her mood appears not anxious. She does not exhibit a depressed mood.  Good mood            Assessment & Plan:   Problem List Items Addressed This Visit      Cardiovascular and Mediastinum   Hypertension    bp not optimally controlled  BP Readings from Last 1 Encounters:  05/27/18 (!) 155/90   Staking lisinopril and metoprolol Will add amlodipine 5 mg as tolerated/update if side effects  Enc DASH eating  Most recent labs reviewed  Disc lifstyle change with low sodium diet and exercise  She will check bp at home and get back to [place]        Relevant Medications   amLODipine (NORVASC) 5 MG tablet   Other Relevant Orders   CBC with Differential/Platelet (Completed)   Jugular vein thrombosis    Pt stopped eliquis (due to inability to afford it) just short of 3 mo planned course No symptoms  Doing well  Taking asa 81 mg daily      Relevant Medications   amLODipine (NORVASC) 5 MG tablet     Digestive   Dental disease    [facility] mo        Endocrine   Diabetes type 2, uncontrolled ([place]) - Primary    For A1C today  On novolog 70/30 17 unit bid with meals and glucose readings are much improved Also doing water aerobics daily  Sticking to DM diet  Commended She is frustrated by weight gain but this may be due to improved glucose control  Enc her to keep up the good work  Can no longer afford the insulin- will likely transition to metformin depending on A1C      Relevant Orders   Hemoglobin A1c (Completed)   Hypothyroidism    Hypothyroidism  Pt has no clinical changes No change in energy level/ hair or skin/  edema and no tremor Lab Results  Component Value Date   TSH 2.60 03/27/2018            Other   HYPERCHOLESTEROLEMIA    Disc goals for lipids and reasons to  control them Rev last labs with pt Rev low sat fat diet in detail For labs today with better diet  No insurance-cannot afford statin at this time but would start it if she gets coverage in light of DM      Relevant Medications   amLODipine (NORVASC) 5 MG tablet   Other Relevant Orders   Lipid panel (Completed)   Panic disorder    Doing better with inc paxil 20 mg  Reviewed stressors/ coping techniques/symptoms/ support sources/ tx options and side effects in detail today

## 2018-05-27 NOTE — Assessment & Plan Note (Signed)
For A1C today  On novolog 70/30 17 unit bid with meals and glucose readings are much improved Also doing water aerobics daily  Sticking to DM diet  Commended She is frustrated by weight gain but this may be due to improved glucose control  Enc her to keep up the good work  Can no longer afford the insulin- will likely transition to metformin depending on A1C

## 2018-05-27 NOTE — Assessment & Plan Note (Signed)
Disc goals for lipids and reasons to control them Rev last labs with pt Rev low sat fat diet in detail For labs today with better diet  No insurance-cannot afford statin at this time but would start it if she gets coverage in light of DM

## 2018-05-27 NOTE — Assessment & Plan Note (Signed)
Hypothyroidism  Pt has no clinical changes No change in energy level/ hair or skin/ edema and no tremor Lab Results  Component Value Date   TSH 2.60 03/27/2018

## 2018-05-27 NOTE — Patient Instructions (Addendum)
Add amlodipine 5 mg one pill daily for blood pressure  Keep watching your blood pressure  Goal below 140 on top and 90 on bottom   We will hold off on cholesterol medicine until you have insurance   I will likely change insulin to metformin - let's get your A1C first   Lab for cbc, A1C and cholesterol today

## 2018-05-29 ENCOUNTER — Telehealth: Payer: Self-pay | Admitting: *Deleted

## 2018-05-29 ENCOUNTER — Encounter: Payer: Self-pay | Admitting: Family Medicine

## 2018-05-29 MED ORDER — METFORMIN HCL 500 MG PO TABS: 500.0000 mg | ORAL_TABLET | Freq: Two times a day (BID) | ORAL | 1 refills | Status: DC

## 2018-05-29 NOTE — Telephone Encounter (Signed)
-----   Message from Judy PimpleMarne A Tower, MD sent at 05/27/2018  8:00 PM EDT ----- Cbc is normal- wbc is no longer elevated  A1C is down to 7.0- excellent!  We can transition her to metformin - I will start with 500 mg 1 po bid #60 5 ref --please call to preferred pharmacy (she can check pricing and let you know) If needed we will inc to 1000 mg bid  Triglycerides are down (from better glucose control)  HDL is up more than 10 points due to exercise  LDL is up as well-so keep working on low fat diet   Please schedule f/u in 3 months for 30 min visit)  We will do a finger stick A1C that day if she does not need other labs

## 2018-07-02 ENCOUNTER — Other Ambulatory Visit: Payer: Self-pay | Admitting: *Deleted

## 2018-07-02 MED ORDER — LEVOTHYROXINE SODIUM 50 MCG PO TABS: 50.0000 ug | ORAL_TABLET | Freq: Every day | ORAL | 5 refills | Status: DC

## 2018-08-03 ENCOUNTER — Other Ambulatory Visit: Payer: Self-pay | Admitting: Family Medicine

## 2018-08-28 ENCOUNTER — Encounter: Payer: Self-pay | Admitting: Family Medicine

## 2018-08-28 ENCOUNTER — Ambulatory Visit: Payer: Self-pay | Admitting: Family Medicine

## 2018-08-28 VITALS — BP 132/76 | HR 72 | Temp 98.1°F | Ht 65.5 in | Wt 192.5 lb

## 2018-08-28 DIAGNOSIS — R232 Flushing: Secondary | ICD-10-CM

## 2018-08-28 DIAGNOSIS — E1165 Type 2 diabetes mellitus with hyperglycemia: Secondary | ICD-10-CM

## 2018-08-28 DIAGNOSIS — I10 Essential (primary) hypertension: Secondary | ICD-10-CM

## 2018-08-28 LAB — POCT GLYCOSYLATED HEMOGLOBIN (HGB A1C): Hemoglobin A1C: 7.3 % — AB (ref 4.0–5.6)

## 2018-08-28 NOTE — Assessment & Plan Note (Signed)
bp in fair control at this time  BP Readings from Last 1 Encounters:  08/28/18 132/76   No changes needed Most recent labs reviewed  Disc lifstyle change with low sodium diet and exercise

## 2018-08-28 NOTE — Assessment & Plan Note (Signed)
A1C today  Expect we will have to inc metformin (if affordable the XR may be better tolerated) Enc strongly to continue low glycemic diet  Also walking  Cannot start statin until she gets ins  Cannot get eye exam until insurance also

## 2018-08-28 NOTE — Patient Instructions (Signed)
Take care of yourself Keep walking   Get your flu shot at the pharmacy   Pam Patel is a popular option over the counter for hot flashes   A1C now and then we will call you with a plan

## 2018-08-28 NOTE — Assessment & Plan Note (Signed)
Already on paxil  Can try otc [employer] or other)  Control environment best she can

## 2018-08-28 NOTE — Progress Notes (Signed)
Subjective:    Patient ID: Pam Patel, female    DOB: 1961/03/27, 57 y.o.   MRN: [id number]  HPI  Here for 3 mo f/u of chronic health problems  Feeling pretty good    Wt Readings from Last 3 Encounters:  08/28/18 192 lb 8 oz (87.3 kg)  05/27/18 192 lb (87.1 kg)  03/27/18 184 lb (83.5 kg)  working in yard and gardening Started mall walking  Pool closed for the season  31.55 kg/m  Hopes for insurance by the end of the year    bp is stable today  No cp or palpitations or headaches or edema  No side effects to medicines  BP Readings from Last 3 Encounters:  08/28/18 132/76  05/27/18 (!) 155/90  03/27/18 (!) 156/94     Diabetes Home sugar results - 160s-170s  140s after breakfast  DM diet -diet is great/doing very well with that   (she does struggle with potatoes- tries to choose sweet potatoes)  Some protein shakes  Bread makes glucose levels go up the most/also crackers  Exercise -walking and outdoor work  Symptoms A1C last  Lab Results  Component Value Date   HGBA1C 7.0 (H) 05/27/2018  due for a re check   No problems with medications - taking metformin 500 mg bid (a little loose stool but she tolerates it)  Renal protection-ace Last eye exam - not affordable  Will get flu shot at pharmacy where it is cheaper  Some foot "irritation" at night - ? Neuropathy-some burning and tingling     Holding off on statin until she can get insurance      Review of Systems  Constitutional: Negative for activity change, appetite change, fatigue, fever and unexpected weight change.  HENT: Negative for congestion, ear pain, rhinorrhea, sinus pressure and sore throat.   Eyes: Negative for pain, redness and visual disturbance.  Respiratory: Negative for cough, shortness of breath and wheezing.   Cardiovascular: Negative for chest pain and palpitations.  Gastrointestinal: Negative for abdominal pain, blood in stool, constipation and diarrhea.  Endocrine: Negative for  polydipsia and polyuria.  Genitourinary: Negative for dysuria, frequency and urgency.  Musculoskeletal: Negative for arthralgias, back pain and myalgias.  Skin: Negative for pallor and rash.  Allergic/Immunologic: Negative for environmental allergies.  Neurological: Negative for dizziness, syncope and headaches.  Hematological: Negative for adenopathy. Does not bruise/bleed easily.  Psychiatric/Behavioral: Negative for decreased concentration and dysphoric mood. The patient is not nervous/anxious.        Objective:   Physical Exam  Constitutional: She appears well-developed and well-nourished. No distress.  obese and well appearing   HENT:  Head: Normocephalic and atraumatic.  Mouth/Throat: Oropharynx is clear and moist.  Eyes: Pupils are equal, round, and reactive to light. Conjunctivae and EOM are normal.  Neck: Normal range of motion. Neck supple. No JVD present. Carotid bruit is not present. No thyromegaly present.  No change in thyroid  Cardiovascular: Normal rate, regular rhythm, normal heart sounds and intact distal pulses. Exam reveals no gallop.  Pulmonary/Chest: Effort normal and breath sounds normal. No respiratory distress. She has no wheezes. She has no rales.  No crackles  Abdominal: Soft. Bowel sounds are normal. She exhibits no distension, no abdominal bruit and no mass. There is no tenderness.  Musculoskeletal: She exhibits no edema.  Lymphadenopathy:    She has no cervical adenopathy.  Neurological: She is alert. She has normal reflexes. She displays normal reflexes. No cranial nerve deficit. Coordination normal.  Skin: Skin is warm and dry. No rash noted.  Ruddy complexion    Psychiatric: She has a normal mood and affect.  cheerful          Assessment & Plan:   Problem List Items Addressed This Visit      Cardiovascular and Mediastinum   Hot flashes    Already on paxil  Can try otc [employer] Jeffie Pollock or other)  Control environment best she can        Hypertension    bp in fair control at this time  BP Readings from Last 1 Encounters:  08/28/18 132/76   No changes needed Most recent labs reviewed  Disc lifstyle change with low sodium diet and exercise          Endocrine   Diabetes type 2, uncontrolled (HCC) - Primary    A1C today  Expect we will have to inc metformin (if affordable the XR may be better tolerated) Enc strongly to continue low glycemic diet  Also walking  Cannot start statin until she gets ins  Cannot get eye exam until insurance also        Relevant Orders   POCT glycosylated hemoglobin (Hb A1C)

## 2018-09-02 ENCOUNTER — Telehealth: Payer: Self-pay | Admitting: Family Medicine

## 2018-09-02 MED ORDER — METFORMIN HCL ER 500 MG PO TB24: ORAL_TABLET | ORAL | 5 refills | Status: DC

## 2018-09-02 NOTE — Telephone Encounter (Signed)
I sent metformin xr to her pharmacy with inst to take 2 pills each am and 1 pill each pm   If any problems let us know  This is a small dosage increase but hopefully will not cause as much loose stool

## 2018-09-02 NOTE — Telephone Encounter (Signed)
Pt notified Rx was sent to pharmacy and advised of Dr. Royden Purl instructions

## 2018-09-02 NOTE — Telephone Encounter (Signed)
Copied from CRM [id number]. Topic: Quick Communication - Rx Refill/Question >> Sep 02, 2018  9:52 AM Baldo Daub L wrote: Medication:  metFORMIN (GLUCOPHAGE) 500 MG tablet  Pt states that she was asked to check on the price difference of this and extended release.  Pt states that the price difference will not be that much and she is okay to go ahead and have this RX changed at the doctors discretion.    Has the patient contacted their pharmacy? no (Agent: If no, request that the patient contact the pharmacy for the refill.) (Agent: If yes, when and what did the pharmacy advise?)  Preferred Pharmacy (with phone number or street name): [facility] [phone] [place], [place] - [address] [phone] (Phone) [phone] (Fax)  Agent: Please be advised that RX refills may take up to 3 business days. We ask that you follow-up with your pharmacy.

## 2018-10-26 IMAGING — US US BIOPSY FNA W/ IMAGING
1 series · 13 of 25 positions shown · non-contrast
Comparison: Neck CT - 02/20/2018;

INDICATION: Cystic/centrally necrotic left cervical lymphadenopathy. Please
perform ultrasound-guided biopsy for tissue diagnostic purposes.

EXAM:
ULTRASOUND GUIDED LEFT CERVICAL LYMPH NODE ASPIRATION AND CORE
NEEDLE BIOPSY
TECHNIQUE: Informed written consent was obtained from the patient after a
discussion of the risks, benefits and alternatives to treatment.
Questions regarding the procedure were encouraged and answered.
Initial ultrasound scanning demonstrated an approximately 2.3 cm
anechoic lymph node within the left mid neck correlating with the
dominant left-sided cervical lymph node seen on preceding
contrast-enhanced neck CT image 53, series 2.. An ultrasound image
was saved for documentation purposes. The procedure was planned. A
timeout was performed prior to the initiation of the procedure.

[Series 1: us biopsy fna w/ imaging · 13 of 29 slices shown]
[im 1/29]
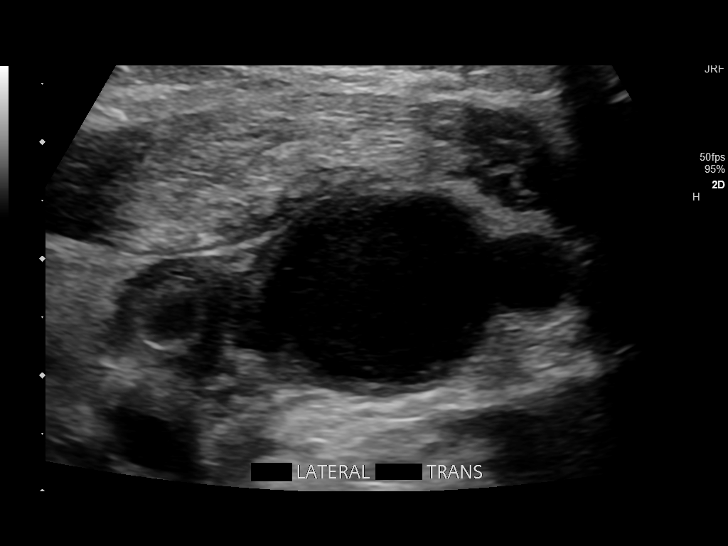
[im 3/29]
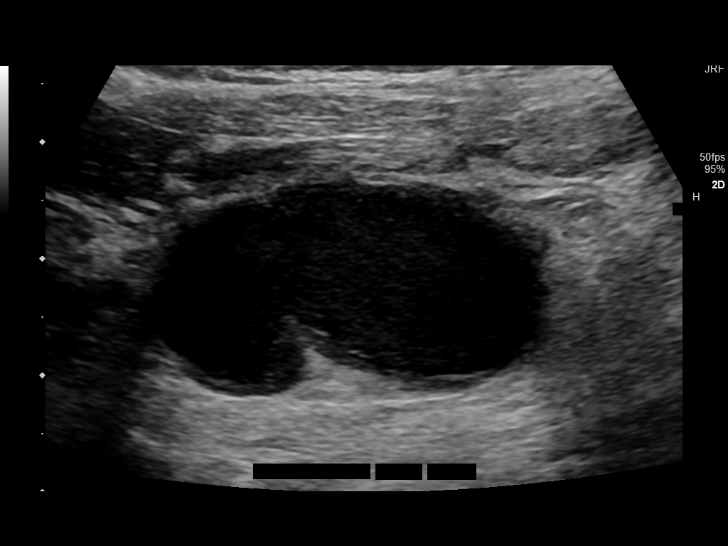
[im 5/29]
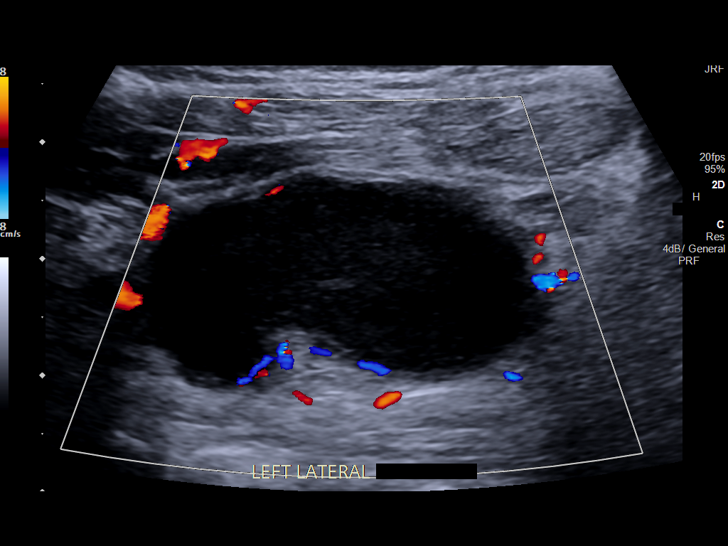
[im 8/29]
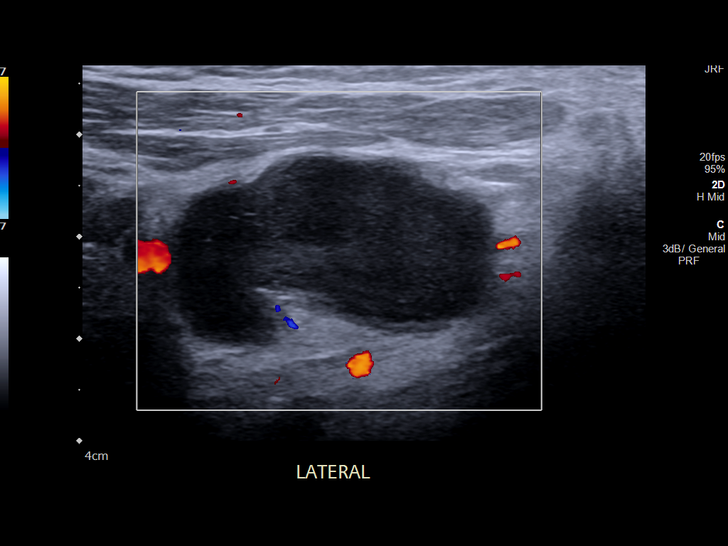
[im 10/29]
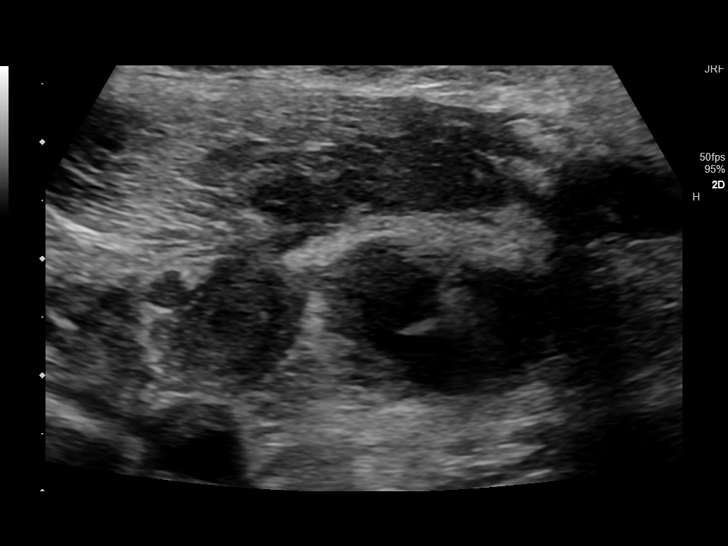
[im 12/29]
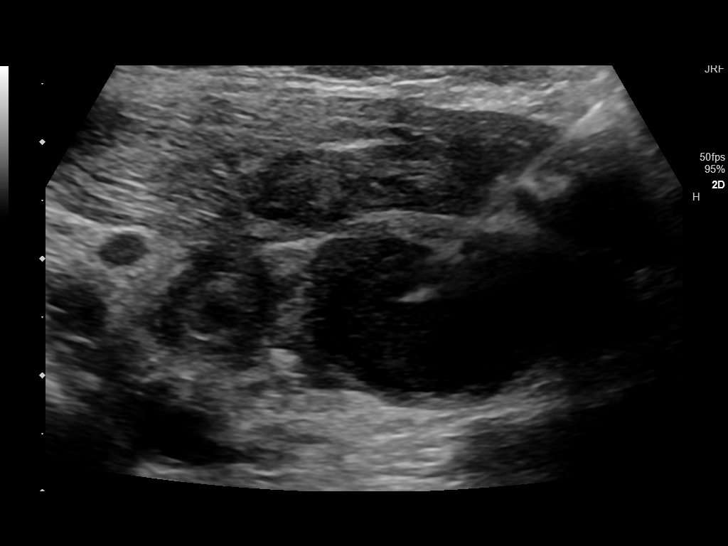
[im 15/29]
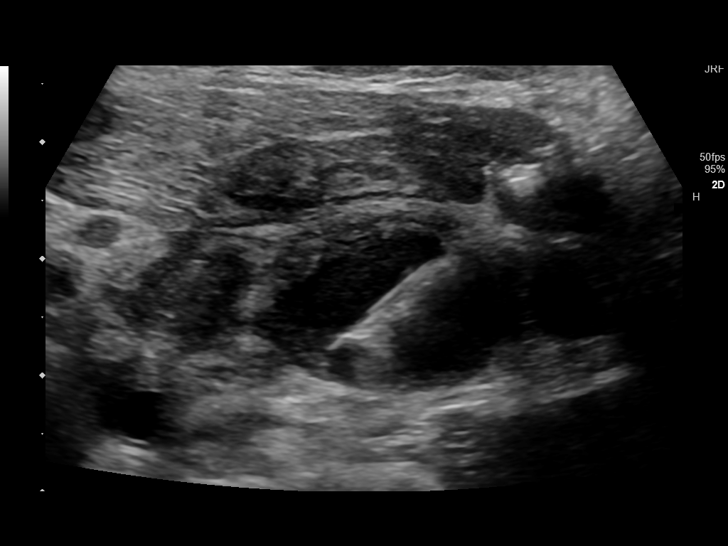
[im 17/29]
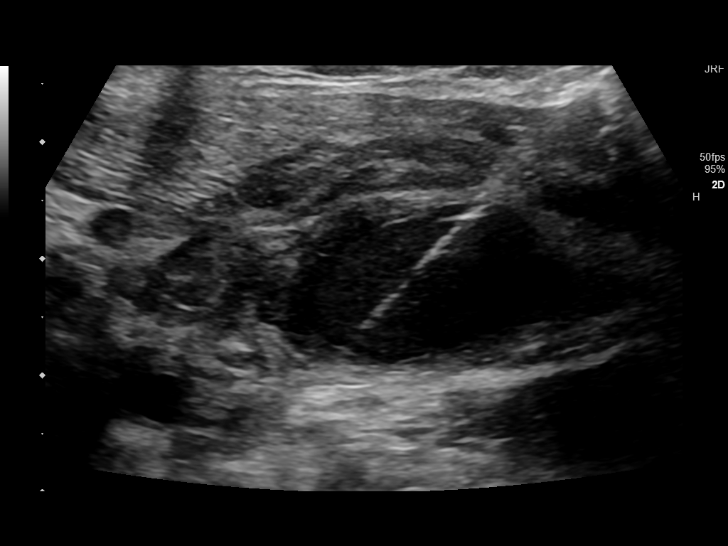
[im 19/29]
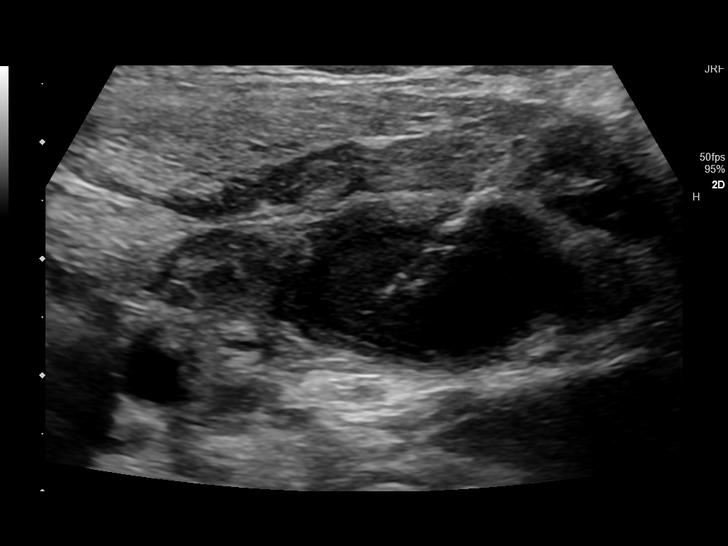
[im 22/29]
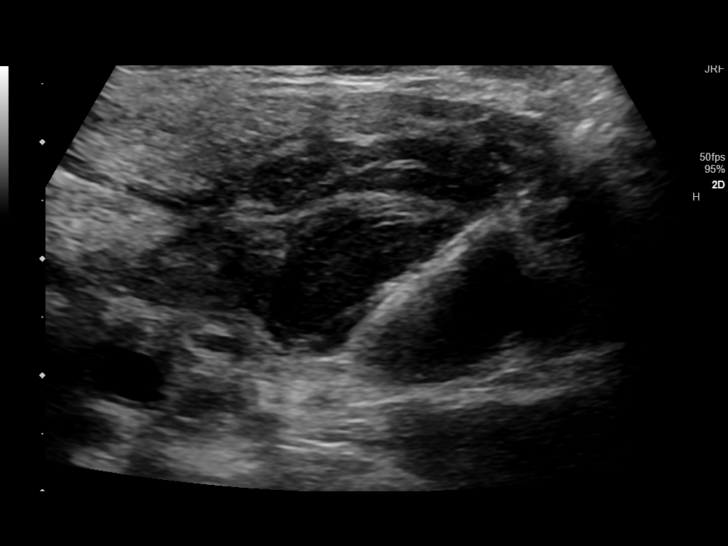
[im 24/29]
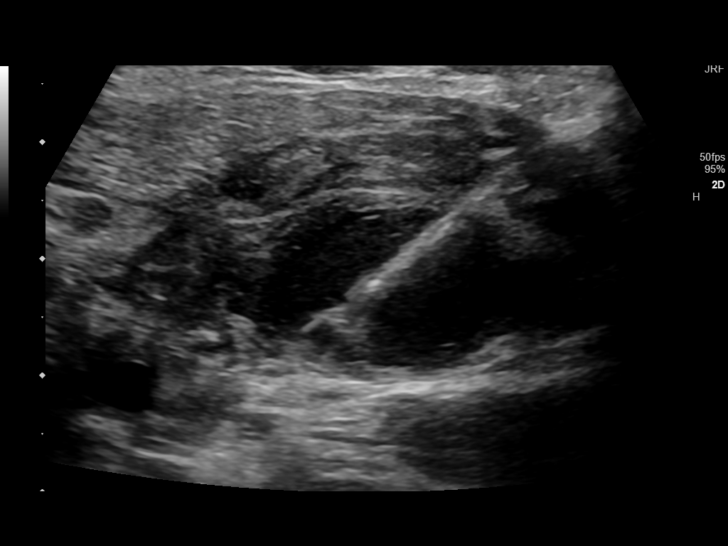
[im 26/29]
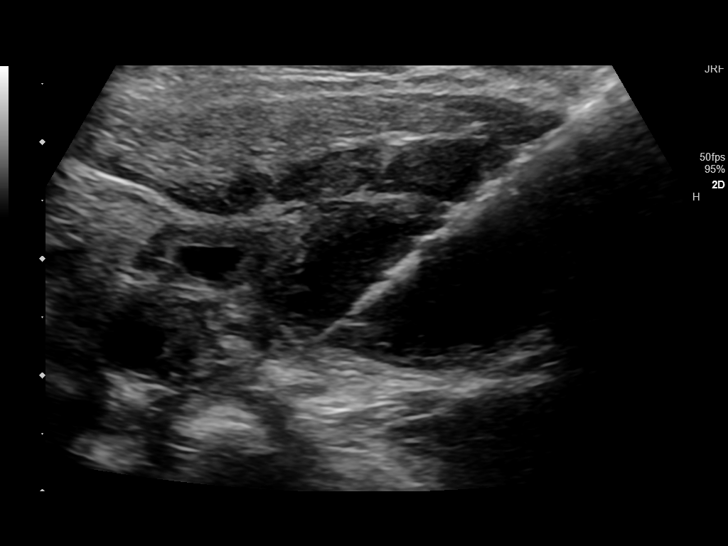
[im 29/29]
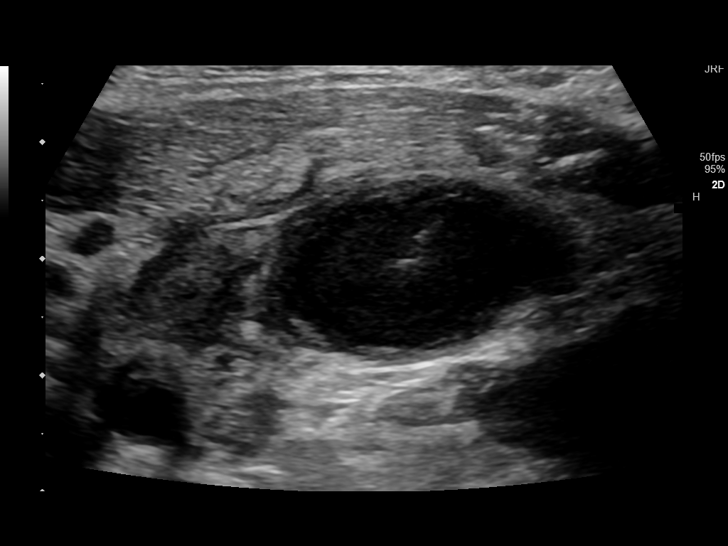

[13 of 25 positions shown; findings below may reference images not displayed]

thyroid ultrasound - 02/20/2018

MEDICATIONS:
None

ANESTHESIA/SEDATION:
None

COMPLICATIONS:
None immediate.
The operative was prepped and draped in the usual sterile fashion,
and a sterile drape was applied covering the operative field. A
timeout was performed prior to the initiation of the procedure.
Local anesthesia was provided with 1% lidocaine with epinephrine.

Under direct ultrasound guidance, an 18 gauge needle was advanced
into the central aspect of the anechoic lymph node. Despite
appropriate needle positioning, only a trace (approximately 0.5 cc)
of fluid was able to be aspirated from the lymph node.

As such, the decision was made to proceed with ultrasound-guided
core needle biopsy.

Under direct ultrasound guidance, an 18 gauge core needle device was
utilized to obtain to obtain 6 core needle biopsies of the anechoic
left-sided cervical lymph node.

The samples were placed in saline and submitted to pathology. The
needle was removed and hemostasis was achieved with manual
compression. Post procedure scan was negative for significant
hematoma. A dressing was placed. The patient tolerated the procedure
well without immediate postprocedural complication.
IMPRESSION: Technically successful ultrasound guided aspiration of approximately
0.5 cc of fluid from the dominant left-sided anechoic cervical lymph
node followed by the acquisition of 6 core needle biopsy samples.

## 2019-01-03 ENCOUNTER — Other Ambulatory Visit: Payer: Self-pay | Admitting: Family Medicine

## 2019-01-24 ENCOUNTER — Telehealth: Payer: Self-pay

## 2019-01-24 NOTE — Telephone Encounter (Signed)
She doesn't meed current criteria for testing or quarantine.  I would follow routine cautions at this point and update [place] if her sx change.  Routed to PCP as FYI.  Thanks.

## 2019-01-24 NOTE — Telephone Encounter (Signed)
Pt called pt's husband was at work and on 01/23/19 and a coworker was at pts husbands desk using his headset,keyboard and desk area. (pt's husband is not sure why coworker was at his desk). This morning pt's husband who had planned to go to [place] today received a call from work that the coworker that used pts desk yesterday was sent home from work because coworkers wife tested positive covid 19. Pt has not traveled,no other known exposures to covid or flu except for pts husband coworker. Pt has no fever and no sob; pt has slight dry cough for 2-3 wks. Pt thinks that is allergies. Pt had cold sweat on 01/23/19 and did not feel good yesterday but pt said could have been a hot flash. Today pt feels OK, no body aches or S/T. Since pt has diabetes and hypertension pt wants to know if she needs to be tested for covid 19?

## 2019-01-24 NOTE — Telephone Encounter (Signed)
Patient advised.

## 2019-02-21 ENCOUNTER — Other Ambulatory Visit: Payer: Self-pay | Admitting: *Deleted

## 2019-02-21 MED ORDER — PAROXETINE HCL 20 MG PO TABS: 20.0000 mg | ORAL_TABLET | Freq: Every day | ORAL | 1 refills | Status: DC

## 2019-03-25 ENCOUNTER — Other Ambulatory Visit: Payer: Self-pay | Admitting: *Deleted

## 2019-03-25 MED ORDER — METFORMIN HCL ER 500 MG PO TB24: ORAL_TABLET | ORAL | 2 refills | Status: DC

## 2019-04-17 ENCOUNTER — Other Ambulatory Visit: Payer: Self-pay | Admitting: Family Medicine

## 2019-04-21 ENCOUNTER — Other Ambulatory Visit: Payer: Self-pay | Admitting: Family Medicine

## 2019-05-04 ENCOUNTER — Other Ambulatory Visit: Payer: Self-pay | Admitting: Family Medicine

## 2019-06-07 ENCOUNTER — Other Ambulatory Visit: Payer: Self-pay | Admitting: Family Medicine

## 2019-06-09 NOTE — Telephone Encounter (Signed)
Last OV was 08/28/18, and no future appts., please advise

## 2019-06-09 NOTE — Telephone Encounter (Signed)
Please schedule a f/u and refill until then  Thanks  

## 2019-06-10 MED ORDER — AMLODIPINE BESYLATE 5 MG PO TABS: 5.0000 mg | ORAL_TABLET | Freq: Every day | ORAL | 0 refills | Status: DC

## 2019-06-10 NOTE — Addendum Note (Signed)
Addended by: Tammi Sou on: 06/10/2019 03:31 PM   Modules accepted: Orders

## 2019-06-10 NOTE — Telephone Encounter (Signed)
Patient scheduled virtual follow up on 06/16/19.

## 2019-06-10 NOTE — Telephone Encounter (Signed)
Morey Hummingbird will reach out to pt to get appt scheduled, med refilled once

## 2019-06-16 ENCOUNTER — Ambulatory Visit: Payer: Self-pay | Admitting: Family Medicine

## 2019-06-23 ENCOUNTER — Ambulatory Visit (INDEPENDENT_AMBULATORY_CARE_PROVIDER_SITE_OTHER): Payer: Self-pay | Admitting: Family Medicine

## 2019-06-23 ENCOUNTER — Encounter: Payer: Self-pay | Admitting: Family Medicine

## 2019-06-23 ENCOUNTER — Other Ambulatory Visit: Payer: Self-pay

## 2019-06-23 DIAGNOSIS — B9789 Other viral agents as the cause of diseases classified elsewhere: Secondary | ICD-10-CM

## 2019-06-23 DIAGNOSIS — Z20822 Contact with and (suspected) exposure to covid-19: Secondary | ICD-10-CM

## 2019-06-23 DIAGNOSIS — J069 Acute upper respiratory infection, unspecified: Secondary | ICD-10-CM

## 2019-06-23 NOTE — Assessment & Plan Note (Signed)
Will test for covid-ordered  Disc self isolation until test result Fluids/rest Expectorant with DM prn  Antihistamine like zyrtec  Watch temp  Update if not starting to improve in a week or if worsening

## 2019-06-23 NOTE — Progress Notes (Signed)
Virtual Visit via Telephone Note  I connected with Pam Patel on 06/23/19 at 11:15 AM EDT by telephone and verified that I am speaking with the correct person using two identifiers.  Location: Patient: home Provider: office    I discussed the limitations, risks, security and privacy concerns of performing an evaluation and management service by telephone and the availability of in person appointments. I also discussed with the patient that there may be a patient responsible charge related to this service. The patient expressed understanding and agreed to proceed.   History of Present Illness: Pt presents with uri symptoms   Last Wednesday- felt like her sinuses were draining in the back of her throat  pnd Cough  Light headed  Headache - facial pressure  Nasal drainage is all clear   Cough got worse last night - clear phlegm production  Gags her  Throat is sore - worse in am and then better as the day goes on   Feels crummy   otc : Took day quil last night  Did not have anything on hand   bp is 159/117 ? -before her medicines -because she feels bad  Has otherwise been controlled   She has not taken her temp  No symptoms of fever   Does get sweaty with panic attacks-this is different She is anx about the pandemic  She is not out in public a lot- is careful when she does    Review of Systems  Constitutional: Positive for diaphoresis and malaise/fatigue. Negative for chills, fever and weight loss.  HENT: Positive for congestion and sore throat. Negative for ear discharge and ear pain.   Eyes: Negative for discharge and redness.  Respiratory: Positive for cough and sputum production. Negative for hemoptysis, shortness of breath and wheezing.   Cardiovascular: Negative for chest pain and palpitations.  Gastrointestinal: Negative for abdominal pain, diarrhea, heartburn, nausea and vomiting.  Skin: Negative for rash.    Patient Active Problem List   Diagnosis Date Noted   . Viral URI with cough 06/23/2019  . Hot flashes 08/28/2018  . 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 Dental disease 03/07/2018  . 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 Lung nodule 03/07/2018  . Hypertension 03/06/2018  . Panic disorder 03/06/2018  . 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 Diabetes type 2, uncontrolled (HCC) 03/06/2018  . Hypothyroidism 10/05/2008  . STRESS REACTION, ACUTE, WITH EMOTIONAL DISTURBANCE 10/05/2008  . HYPERCHOLESTEROLEMIA 01/15/2008  . Former smoker 01/15/2008  . ALLERGIC RHINITIS, SEASONAL 01/15/2008  . 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 History of chickenpox   . History of diabetes mellitus, type II   . History of hypertension   . Hypertension    Past Surgical History:  Procedure Laterality Date  . APPENDECTOMY  1990  . BACK SURGERY     Social History   Tobacco Use  . Smoking status: Former Smoker  . Smokeless tobacco: Never Used  Substance Use Topics  . Alcohol use: Not Currently    Frequency: Never  . Drug use: Never   Family History  Problem Relation Age of Onset  . COPD Mother   . Diabetes Mother   . Heart attack Mother   . Heart disease Mother   . Hypertension Mother   . Stroke Mother   . Heart attack Father    Allergies  Allergen Reactions  . Penicillins Hives    Has patient had a PCN reaction causing immediate rash, facial/tongue/throat swelling,   SOB or lightheadedness with hypotension: Unknown Has patient had a PCN reaction causing severe rash involving mucus membranes or skin necrosis: No Has patient had a PCN reaction that required hospitalization: No Has patient had a PCN reaction occurring within the last 10 years: No If all of the above answers are "NO", then may proceed with Cephalosporin use.   . Naproxen     REACTION: hives   Current Outpatient Medications on File Prior to Visit  Medication Sig Dispense Refill  . acetaminophen (TYLENOL) 500 MG tablet Take 500 mg by  mouth every 6 (six) hours as needed for mild pain.    . amLODipine (NORVASC) 5 MG tablet Take 1 tablet (5 mg total) by mouth daily. 90 tablet 0  . aspirin EC 81 MG tablet Take 162 mg by mouth daily.     . blood glucose meter kit and supplies KIT Dispense based on patient and insurance preference. Use up to four times daily as directed. (FOR ICD-9 250.00, 250.01). 1 each 0  . levothyroxine (SYNTHROID) 50 MCG tablet TAKE 1 TABLET (50 MCG TOTAL) BY MOUTH DAILY. IN AM 30 MINUTES BEFORE FOOD OR OTHER MEDICINES 90 tablet 0  . lisinopril (ZESTRIL) 40 MG tablet Take 1 tablet by mouth once daily 90 tablet 0  . metFORMIN (GLUCOPHAGE-XR) 500 MG 24 hr tablet Take 2 pills by mouth every am and 1 pill every pm 90 tablet 2  . metoprolol tartrate (LOPRESSOR) 50 MG tablet Take 1 tablet by mouth twice daily 180 tablet 0  . PARoxetine (PAXIL) 20 MG tablet Take 1 tablet (20 mg total) by mouth daily. 90 tablet 1   No current facility-administered medications on file prior to visit.     Observations/Objective: Pt sounds like her usual self /not distressed Slightly anxious (baseline)  occ clears throat but no cough or sob while speaking  Slightly hoarse  Pleasant  Nl cognition   Assessment and Plan: Problem List Items Addressed This Visit      Respiratory   Viral URI with cough    Will test for covid-ordered  Disc self isolation until test result Fluids/rest Expectorant with DM prn  Antihistamine like zyrtec  Watch temp  Update if not starting to improve in a week or if worsening            Follow Up Instructions: Isolate yourself for now  Go get tested for covid at [facility]  Try mucinex DM for cough and congestion  Zyrtec for runny nose and drip  Lots of fluids and rest   Update if not starting to improve in a week or if worsening    I discussed the assessment and treatment plan with the patient. The patient was provided an opportunity to ask questions and all were answered. The  patient agreed with the plan and demonstrated an understanding of the instructions.   The patient was advised to call back or seek an in-person evaluation if the symptoms worsen or if the condition fails to improve as anticipated.  I provided 15 minutes of non-face-to-face time during this encounter.   Marne Tower, MD   

## 2019-06-23 NOTE — Patient Instructions (Addendum)
Isolate yourself for now  Go get tested for covid at [facility]  Try mucinex DM for cough and congestion  Zyrtec for runny nose and drip  Lots of fluids and rest   Update if not starting to improve in a week or if worsening

## 2019-06-24 LAB — NOVEL CORONAVIRUS, NAA: SARS-CoV-2, NAA: NOT DETECTED

## 2019-09-07 ENCOUNTER — Other Ambulatory Visit: Payer: Self-pay | Admitting: Family Medicine

## 2019-09-08 NOTE — Telephone Encounter (Signed)
Med refilled once and Carrie will reach out to pt to try and get f/u scheduled  

## 2019-09-08 NOTE — Telephone Encounter (Signed)
Please schedule f/u and refill until then Thanks  

## 2019-09-08 NOTE — Telephone Encounter (Signed)
Pt has only had 1 virtual acute appt this year and no recent or future f/u or CPEs please advise

## 2019-09-10 ENCOUNTER — Other Ambulatory Visit: Payer: Self-pay | Admitting: Family Medicine

## 2019-09-19 ENCOUNTER — Ambulatory Visit (INDEPENDENT_AMBULATORY_CARE_PROVIDER_SITE_OTHER): Payer: Self-pay | Admitting: Family Medicine

## 2019-09-19 ENCOUNTER — Other Ambulatory Visit: Payer: Self-pay

## 2019-09-19 ENCOUNTER — Encounter: Payer: Self-pay | Admitting: Family Medicine

## 2019-09-19 VITALS — BP 132/88 | HR 68 | Temp 97.3°F | Ht 65.5 in | Wt 196.1 lb

## 2019-09-19 DIAGNOSIS — E1165 Type 2 diabetes mellitus with hyperglycemia: Secondary | ICD-10-CM

## 2019-09-19 DIAGNOSIS — E039 Hypothyroidism, unspecified: Secondary | ICD-10-CM

## 2019-09-19 DIAGNOSIS — F410 Panic disorder [episodic paroxysmal anxiety] without agoraphobia: Secondary | ICD-10-CM

## 2019-09-19 DIAGNOSIS — E1169 Type 2 diabetes mellitus with other specified complication: Secondary | ICD-10-CM

## 2019-09-19 DIAGNOSIS — R918 Other nonspecific abnormal finding of lung field: Secondary | ICD-10-CM

## 2019-09-19 DIAGNOSIS — I10 Essential (primary) hypertension: Secondary | ICD-10-CM

## 2019-09-19 DIAGNOSIS — E7800 Pure hypercholesterolemia, unspecified: Secondary | ICD-10-CM

## 2019-09-19 DIAGNOSIS — R911 Solitary pulmonary nodule: Secondary | ICD-10-CM

## 2019-09-19 DIAGNOSIS — E785 Hyperlipidemia, unspecified: Secondary | ICD-10-CM

## 2019-09-19 LAB — COMPREHENSIVE METABOLIC PANEL
ALT: 33 U/L (ref 0–35)
AST: 35 U/L (ref 0–37)
Albumin: 4.1 g/dL (ref 3.5–5.2)
Alkaline Phosphatase: 87 U/L (ref 39–117)
BUN: 10 mg/dL (ref 6–23)
CO2: 28 mEq/L (ref 19–32)
Calcium: 10.1 mg/dL (ref 8.4–10.5)
Chloride: 100 mEq/L (ref 96–112)
Creatinine, Ser: 0.7 mg/dL (ref 0.40–1.20)
GFR: 85.43 mL/min (ref >60.00)
Glucose, Bld: 277 mg/dL — ABNORMAL HIGH (ref 70–99)
Potassium: 4.1 mEq/L (ref 3.5–5.1)
Sodium: 135 mEq/L (ref 135–145)
Total Bilirubin: 0.4 mg/dL (ref 0.2–1.2)
Total Protein: 7 g/dL (ref 6.0–8.3)

## 2019-09-19 LAB — LIPID PANEL
Cholesterol: 223 mg/dL — ABNORMAL HIGH (ref 0–200)
HDL: 31 mg/dL — ABNORMAL LOW (ref >39.00)
NonHDL: 191.56
Total CHOL/HDL Ratio: 7
Triglycerides: 343 mg/dL — ABNORMAL HIGH (ref 0.0–149.0)
VLDL: 68.6 mg/dL — ABNORMAL HIGH (ref 0.0–40.0)

## 2019-09-19 LAB — LDL CHOLESTEROL, DIRECT: Direct LDL: 153 mg/dL

## 2019-09-19 LAB — TSH: TSH: 7.64 u[IU]/mL — ABNORMAL HIGH (ref 0.35–4.50)

## 2019-09-19 LAB — HEMOGLOBIN A1C: Hgb A1c MFr Bld: 12.1 % — ABNORMAL HIGH (ref 4.6–6.5)

## 2019-09-19 MED ORDER — AMLODIPINE BESYLATE 5 MG PO TABS: 5.0000 mg | ORAL_TABLET | Freq: Every day | ORAL | 3 refills | Status: DC

## 2019-09-19 MED ORDER — ATORVASTATIN CALCIUM 10 MG PO TABS: 10.0000 mg | ORAL_TABLET | Freq: Every day | ORAL | 3 refills | Status: DC

## 2019-09-19 MED ORDER — PAROXETINE HCL 20 MG PO TABS: 20.0000 mg | ORAL_TABLET | Freq: Every day | ORAL | 3 refills | Status: DC

## 2019-09-19 MED ORDER — METOPROLOL TARTRATE 50 MG PO TABS: 50.0000 mg | ORAL_TABLET | Freq: Two times a day (BID) | ORAL | 3 refills | Status: DC

## 2019-09-19 MED ORDER — METFORMIN HCL ER 500 MG PO TB24: ORAL_TABLET | ORAL | 3 refills | Status: DC

## 2019-09-19 MED ORDER — LISINOPRIL 40 MG PO TABS: 40.0000 mg | ORAL_TABLET | Freq: Every day | ORAL | 3 refills | Status: DC

## 2019-09-19 MED ORDER — LEVOTHYROXINE SODIUM 50 MCG PO TABS: 50.0000 ug | ORAL_TABLET | Freq: Every day | ORAL | 3 refills | Status: DC

## 2019-09-19 NOTE — Progress Notes (Signed)
Subjective:    Patient ID: Pam Patel, female    DOB: November 01, 1961, 58 y.o.   MRN: [id number]  HPI Here for f/u of chronic medical problems  Doing well overall  Still no insurance Her husband had to get ins due to health problems  She may have it soon    Staying at home Staying safe  Did a lot of gardening this summer -loves it    Wt Readings from Last 3 Encounters:  09/19/19 196 lb 2 oz (89 kg)  08/28/18 192 lb 8 oz (87.3 kg)  05/27/18 192 lb (87.1 kg)  gained 3 lb by her scales  32.14 kg/m  Getting some exercise-walking some /garden  Was not eating as well until a month ago -she and her husband are doing better   bp is stable today  No cp or palpitations or headaches or edema  No side effects to medicines  BP Readings from Last 3 Encounters:  09/19/19 132/88  08/28/18 132/76  05/27/18 (!) 155/90     DM2 Lab Results  Component Value Date   HGBA1C 7.3 (A) 08/28/2018  started eating smaller portions in the past mo  Not the best choices -not as good as a year ago  Needs labs  Metformin xr  On ace  Not on statin    Hypothyroid Lab Results  Component Value Date   TSH 2.60 03/27/2018   due for labs   hyperlidemia Lab Results  Component Value Date   CHOL 201 (H) 05/27/2018   HDL 37.90 (L) 05/27/2018   LDLCALC 125 (H) 05/27/2018   LDLDIRECT 134.4 11/05/2008   TRIG 191.0 (H) 05/27/2018   CHOLHDL 5 05/27/2018   Due for labs  Diet not as good  She is open to statin if it is affordable - would like to send it in   Mood -good control with panic /etc   Lung nodule incidentally seen 4/19 with CT of head/neck   Planning flu shot-no ins, so will get at pharmacy or health dept   Patient Active Problem List   Diagnosis Date Noted  . Hot flashes 08/28/2018  . 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 Dental disease 03/07/2018  . 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 Lung nodule 03/07/2018  . Hypertension 03/06/2018  . Panic disorder 03/06/2018  . Thyromegaly  03/06/2018  . Diabetes type 2, uncontrolled ([place]) 03/06/2018  . Hypothyroidism 10/05/2008  . STRESS REACTION, ACUTE, WITH EMOTIONAL DISTURBANCE 10/05/2008  . HYPERCHOLESTEROLEMIA 01/15/2008  . Former smoker 01/15/2008  . ALLERGIC RHINITIS, SEASONAL 01/15/2008  . 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 History of chickenpox   . History of diabetes mellitus, type II   . History of hypertension   . Hypertension    Past Surgical History:  Procedure Laterality Date  . APPENDECTOMY  1990  . BACK SURGERY     Social History   Tobacco Use  . Smoking status: Former Research scientist (life sciences)  . Smokeless tobacco: Never Used  Substance Use Topics  . Alcohol use: Not Currently    Frequency: Never  . Drug use: Never   Family History  Problem Relation Age of Onset  . COPD Mother   . Diabetes Mother   . Heart attack Mother   . Heart disease Mother   . Hypertension Mother   . Stroke Mother   . Heart attack Father    Allergies  Allergen Reactions  . Penicillins Hives    Has patient  had a PCN reaction causing immediate rash, facial/tongue/throat swelling, SOB or lightheadedness with hypotension: Unknown Has patient had a PCN reaction causing severe rash involving mucus membranes or skin necrosis: No Has patient had a PCN reaction that required hospitalization: No Has patient had a PCN reaction occurring within the last 10 years: No If all of the above answers are "NO", then may proceed with Cephalosporin use.   . Naproxen     REACTION: hives   Current Outpatient Medications on File Prior to Visit  Medication Sig Dispense Refill  . aspirin EC 81 MG tablet Take 81 mg by mouth daily.     . blood glucose meter kit and supplies KIT Dispense based on patient and insurance preference. Use up to four times daily as directed. (FOR ICD-9 250.00, 250.01). 1 each 0   No current facility-administered medications on file prior to visit.      Review of  Systems  Constitutional: Negative for activity change, appetite change, fatigue, fever and unexpected weight change.  HENT: Negative for congestion, ear pain, rhinorrhea, sinus pressure and sore throat.        No change in thyroid size  Eyes: Negative for pain, redness and visual disturbance.  Respiratory: Negative for cough, shortness of breath and wheezing.   Cardiovascular: Negative for chest pain and palpitations.  Gastrointestinal: Negative for abdominal pain, blood in stool, constipation and diarrhea.  Endocrine: Negative for polydipsia, polyphagia and polyuria.  Genitourinary: Negative for dysuria, frequency and urgency.  Musculoskeletal: Negative for arthralgias, back pain and myalgias.  Skin: Negative for pallor and rash.  Allergic/Immunologic: Negative for environmental allergies.  Neurological: Negative for dizziness, syncope and headaches.  Hematological: Negative for adenopathy. Does not bruise/bleed easily.  Psychiatric/Behavioral: Negative for decreased concentration and dysphoric mood. The patient is not nervous/anxious.        Mood is stable       Objective:   Physical Exam Constitutional:      General: She is not in acute distress.    Appearance: Normal appearance. She is well-developed. She is obese. She is not ill-appearing.  HENT:     Head: Normocephalic and atraumatic.  Eyes:     Conjunctiva/sclera: Conjunctivae normal.     Pupils: Pupils are equal, round, and reactive to light.  Neck:     Musculoskeletal: Normal range of motion and neck supple.     Thyroid: No thyromegaly.     Vascular: No carotid bruit or JVD.  Cardiovascular:     Rate and Rhythm: Normal rate and regular rhythm.     Heart sounds: Normal heart sounds. No gallop.   Pulmonary:     Effort: Pulmonary effort is normal. No respiratory distress.     Breath sounds: Normal breath sounds. No wheezing or rales.  Abdominal:     General: Bowel sounds are normal. There is no distension or abdominal  bruit.     Palpations: Abdomen is soft. There is no mass.     Tenderness: There is no abdominal tenderness.  Musculoskeletal:     Right lower leg: No edema.  Lymphadenopathy:     Cervical: No cervical adenopathy.  Skin:    General: Skin is warm and dry.     Findings: No rash.     Comments: tanned  Neurological:     Mental Status: She is alert.     Cranial Nerves: No cranial nerve deficit.     Sensory: No sensory deficit.     Coordination: Coordination normal.  Deep Tendon Reflexes: Reflexes are normal and symmetric. Reflexes normal.  Psychiatric:        Mood and Affect: Mood normal.           Assessment & Plan:   Problem List Items Addressed This Visit      Cardiovascular and Mediastinum   Hypertension    bp in fair control at this time  BP Readings from Last 1 Encounters:  09/19/19 132/88   No changes needed Most recent labs reviewed  Disc lifstyle change with low sodium diet and exercise  Labs drawn       Relevant Medications   atorvastatin (LIPITOR) 10 MG tablet   lisinopril (ZESTRIL) 40 MG tablet   metoprolol tartrate (LOPRESSOR) 50 MG tablet   amLODipine (NORVASC) 5 MG tablet   Other Relevant Orders   Comprehensive metabolic panel (Completed)     Endocrine   Hypothyroidism    TSH today  No clinical changes       Relevant Medications   levothyroxine (SYNTHROID) 50 MCG tablet   metoprolol tartrate (LOPRESSOR) 50 MG tablet   Other Relevant Orders   TSH (Completed)   Diabetes type 2, uncontrolled ([place]) - Primary    A1C today This may be up-pt is less attentive re: diet  Not checking glucose levels  On ace Statin px today (she will see if she can affort) Lack of insurance has interfered with routine care  Pt plans to get flu shot at pharmacy or health dept due to lack of insurance      Relevant Medications   atorvastatin (LIPITOR) 10 MG tablet   metFORMIN (GLUCOPHAGE-XR) 500 MG 24 hr tablet   lisinopril (ZESTRIL) 40 MG tablet   Other  Relevant Orders   Hemoglobin A1c (Completed)     Other   HYPERCHOLESTEROLEMIA    Disc goals for lipids and reasons to control them Rev last labs with pt Rev low sat fat diet in detail Labs today  Px atorvastatin - inst to stop it and call if any side effects        Relevant Medications   atorvastatin (LIPITOR) 10 MG tablet   lisinopril (ZESTRIL) 40 MG tablet   metoprolol tartrate (LOPRESSOR) 50 MG tablet   amLODipine (NORVASC) 5 MG tablet   Other Relevant Orders   Lipid panel (Completed)   Panic disorder    Reviewed stressors/ coping techniques/symptoms/ support sources/ tx options and side effects in detail today Fairly good control with paroxetine       Relevant Medications   PARoxetine (PAXIL) 20 MG tablet   Lung nodule    Needs f/u CT for this   (R lung incidental 4/19 spiculated)- thought poss due to her condition at this time  Has been put off due to lack of insurance  She is open to seeing if she can afford it  Former smoker  Ref done       Other Visit Diagnoses    Abnormal findings on diagnostic imaging of lung       Relevant Orders   CT Chest Wo Contrast

## 2019-09-19 NOTE — Patient Instructions (Addendum)
Try to get most of your carbohydrates from produce (with the exception of white potatoes)  Eat less bread/pasta/rice/snack foods/cereals/sweets and other items from the middle of the grocery store (processed carbs)   Start atorvastatin 10 mg daily  If any side effects let us know  Avoid red meat/ fried foods/ egg yolks/ fatty breakfast meats/ butter, cheese and high fat dairy/ and shellfish    Get your flu shot at a pharmacy or health dept and have them send [place] the report   Labs today   Our referral coordinator will call you re: CT scan    Start checking blood sugar levels again  Try to stick with diabetic diet

## 2019-09-21 NOTE — Assessment & Plan Note (Signed)
Needs f/u CT for this   (R lung incidental 4/19 spiculated)- thought poss due to her condition at this time  Has been put off due to lack of insurance  She is open to seeing if she can afford it  Former smoker  Ref done

## 2019-09-21 NOTE — Assessment & Plan Note (Signed)
TSH today  No clinical changes  

## 2019-09-21 NOTE — Assessment & Plan Note (Signed)
bp in fair control at this time  BP Readings from Last 1 Encounters:  09/19/19 132/88   No changes needed Most recent labs reviewed  Disc lifstyle change with low sodium diet and exercise  Labs drawn

## 2019-09-21 NOTE — Assessment & Plan Note (Addendum)
A1C today This may be up-pt is less attentive re: diet  Not checking glucose levels  On ace Statin px today (she will see if she can affort) Lack of insurance has interfered with routine care  Pt plans to get flu shot at pharmacy or health dept due to lack of insurance

## 2019-09-21 NOTE — Assessment & Plan Note (Signed)
Reviewed stressors/ coping techniques/symptoms/ support sources/ tx options and side effects in detail today Fairly good control with paroxetine

## 2019-09-21 NOTE — Assessment & Plan Note (Signed)
Disc goals for lipids and reasons to control them Rev last labs with pt Rev low sat fat diet in detail Labs today  Px atorvastatin - inst to stop it and call if any side effects

## 2019-09-23 ENCOUNTER — Telehealth: Payer: Self-pay | Admitting: Family Medicine

## 2019-09-23 DIAGNOSIS — R911 Solitary pulmonary nodule: Secondary | ICD-10-CM

## 2019-09-23 DIAGNOSIS — R918 Other nonspecific abnormal finding of lung field: Secondary | ICD-10-CM

## 2019-09-23 NOTE — Telephone Encounter (Signed)
I changed the order  Thanks

## 2019-09-23 NOTE — Telephone Encounter (Signed)
Spoke with the patient regarding the CT Chest order that was recommended by Radiologist last April. The recommendation is for a CT Chest with contrast, the patient has no insurance right now. I called [facility]  and gave her the cost of a CT Chest with contrast and without contrast with pay in full options and payment plans. The patient wants to think it over and call me back. Current order is for a CT Chest without contrast but the recommended study is for a CT Chest with.

## 2019-09-27 ENCOUNTER — Other Ambulatory Visit: Payer: Self-pay | Admitting: Family Medicine

## 2019-10-06 ENCOUNTER — Ambulatory Visit: Payer: Self-pay | Admitting: Family Medicine

## 2019-10-20 ENCOUNTER — Encounter: Payer: Self-pay | Admitting: Family Medicine

## 2019-10-20 ENCOUNTER — Other Ambulatory Visit: Payer: Self-pay

## 2019-10-20 ENCOUNTER — Ambulatory Visit (INDEPENDENT_AMBULATORY_CARE_PROVIDER_SITE_OTHER): Payer: Self-pay | Admitting: Family Medicine

## 2019-10-20 VITALS — BP 122/68 | HR 80 | Temp 96.9°F | Ht 65.5 in | Wt 195.3 lb

## 2019-10-20 DIAGNOSIS — I10 Essential (primary) hypertension: Secondary | ICD-10-CM

## 2019-10-20 DIAGNOSIS — E785 Hyperlipidemia, unspecified: Secondary | ICD-10-CM

## 2019-10-20 DIAGNOSIS — E1165 Type 2 diabetes mellitus with hyperglycemia: Secondary | ICD-10-CM

## 2019-10-20 DIAGNOSIS — E039 Hypothyroidism, unspecified: Secondary | ICD-10-CM

## 2019-10-20 DIAGNOSIS — E1169 Type 2 diabetes mellitus with other specified complication: Secondary | ICD-10-CM

## 2019-10-20 LAB — ALT: ALT: 41 U/L — ABNORMAL HIGH (ref 0–35)

## 2019-10-20 LAB — LIPID PANEL
Cholesterol: 220 mg/dL — ABNORMAL HIGH (ref 0–200)
HDL: 33.6 mg/dL — ABNORMAL LOW (ref >39.00)
NonHDL: 186.66
Total CHOL/HDL Ratio: 7
Triglycerides: 246 mg/dL — ABNORMAL HIGH (ref 0.0–149.0)
VLDL: 49.2 mg/dL — ABNORMAL HIGH (ref 0.0–40.0)

## 2019-10-20 LAB — LDL CHOLESTEROL, DIRECT: Direct LDL: 151 mg/dL

## 2019-10-20 LAB — TSH: TSH: 3.11 u[IU]/mL (ref 0.35–4.50)

## 2019-10-20 LAB — AST: AST: 60 U/L — ABNORMAL HIGH (ref 0–37)

## 2019-10-20 MED ORDER — GLIPIZIDE ER 5 MG PO TB24: 5.0000 mg | ORAL_TABLET | Freq: Every day | ORAL | 11 refills | Status: DC

## 2019-10-20 NOTE — Progress Notes (Signed)
Subjective:    Patient ID: Pam Patel, female    DOB: 01/24/1961, 58 y.o.   MRN: [id number]  This visit occurred during the SARS-CoV-2 public health emergency.  Safety protocols were in place, including screening questions prior to the visit, additional usage of staff PPE, and extensive cleaning of exam room while observing appropriate contact time as indicated for disinfecting solutions.    HPI Pt presents for f/u of chronic health problems   Wt Readings from Last 3 Encounters:  10/20/19 195 lb 5 oz (88.6 kg)  09/19/19 196 lb 2 oz (89 kg)  08/28/18 192 lb 8 oz (87.3 kg)   32.01 kg/m   Exercise- walking dogs    bp is stable today  No cp or palpitations or headaches or edema  No side effects to medicines  BP Readings from Last 3 Encounters:  10/20/19 122/68  09/19/19 132/88  08/28/18 132/76     Hypothyroid Lab Results  Component Value Date   TSH 7.64 (H) 09/19/2019   was off her thyroid medication for 3 weeks  She is now back on it   Lab Results  Component Value Date   CHOL 223 (H) 09/19/2019   HDL 31.00 (L) 09/19/2019   LDLCALC 125 (H) 05/27/2018   LDLDIRECT 153.0 09/19/2019   TRIG 343.0 (H) 09/19/2019   CHOLHDL 7 09/19/2019   Now taking atovastatin   DM2 Lab Results  Component Value Date   HGBA1C 12.1 (H) 09/19/2019   Very high  Glucose was 277 at moment taken  Glucose records from home 200s- to 300s  One in 400 range   Taking metformin xr 500 mg 2 pills in am and 1 in pm   Before her last A1C she did occ forget her medication but usually took it   Not eating 100% diabetic diet  Some fruit and some green vegetables (lots of salad)  occ sweets -not often    Glucose was highest after chips and [place]   On ace On statin   Flu vaccine - plans to get at [facility]   Lab Results  Component Value Date   CREATININE 0.70 09/19/2019   BUN 10 09/19/2019   NA 135 09/19/2019   K 4.1 09/19/2019   CL 100 09/19/2019   CO2 28 09/19/2019   For  lung nodule- will self pay for CT after the first of the year   She is working on trying to get health insurance   Patient Active Problem List   Diagnosis Date Noted  . Hot flashes 08/28/2018  . 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 Dental disease 03/07/2018  . 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 Lung nodule 03/07/2018  . Hypertension 03/06/2018  . Panic disorder 03/06/2018  . 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 Diabetes type 2, uncontrolled ([place]) 03/06/2018  . Hypothyroidism 10/05/2008  . STRESS REACTION, ACUTE, WITH EMOTIONAL DISTURBANCE 10/05/2008  . Hyperlipidemia associated with type 2 diabetes mellitus ([place]) 01/15/2008  . Former smoker 01/15/2008  . ALLERGIC RHINITIS, SEASONAL 01/15/2008  . 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 History of chickenpox   . History of diabetes mellitus, type II   . History of hypertension   . Hypertension    Past Surgical History:  Procedure Laterality Date  . APPENDECTOMY  1990  . BACK SURGERY     Social History   Tobacco Use  . Smoking status: Former Research scientist (life sciences)  . Smokeless tobacco: Never Used  Substance Use Topics  .  Alcohol use: Not Currently  . Drug use: Never   Family History  Problem Relation Age of Onset  . COPD Mother   . Diabetes Mother   . Heart attack Mother   . Heart disease Mother   . Hypertension Mother   . Stroke Mother   . Heart attack Father    Allergies  Allergen Reactions  . Penicillins Hives    Has patient had a PCN reaction causing immediate rash, facial/tongue/throat swelling, SOB or lightheadedness with hypotension: Unknown Has patient had a PCN reaction causing severe rash involving mucus membranes or skin necrosis: No Has patient had a PCN reaction that required hospitalization: No Has patient had a PCN reaction occurring within the last 10 years: No If all of the above answers are "NO", then may proceed with Cephalosporin use.   . Naproxen     REACTION:  hives   Current Outpatient Medications on File Prior to Visit  Medication Sig Dispense Refill  . amLODipine (NORVASC) 5 MG tablet Take 1 tablet (5 mg total) by mouth daily. 90 tablet 3  . aspirin EC 81 MG tablet Take 81 mg by mouth daily.     Marland Kitchen atorvastatin (LIPITOR) 10 MG tablet Take 1 tablet (10 mg total) by mouth daily. 90 tablet 3  . blood glucose meter kit and supplies KIT Dispense based on patient and insurance preference. Use up to four times daily as directed. (FOR ICD-9 250.00, 250.01). 1 each 0  . levothyroxine (SYNTHROID) 50 MCG tablet Take 1 tablet (50 mcg total) by mouth daily. In am 30 minutes before food or other medicines 90 tablet 3  . lisinopril (ZESTRIL) 40 MG tablet Take 1 tablet (40 mg total) by mouth daily. 90 tablet 3  . metFORMIN (GLUCOPHAGE-XR) 500 MG 24 hr tablet TAKE 2 PILLS BY MOUTH EVERY MORNING AND 1 PILL EVERY PEVENING 90 tablet 3  . metoprolol tartrate (LOPRESSOR) 50 MG tablet Take 1 tablet (50 mg total) by mouth 2 (two) times daily. 180 tablet 3  . PARoxetine (PAXIL) 20 MG tablet Take 1 tablet (20 mg total) by mouth daily. 90 tablet 3   No current facility-administered medications on file prior to visit.     Review of Systems  Constitutional: Negative for activity change, appetite change, fatigue, fever and unexpected weight change.  HENT: Negative for congestion, ear pain, rhinorrhea, sinus pressure and sore throat.   Eyes: Negative for pain, redness and visual disturbance.  Respiratory: Negative for cough, shortness of breath and wheezing.   Cardiovascular: Negative for chest pain and palpitations.  Gastrointestinal: Negative for abdominal pain, blood in stool, constipation and diarrhea.  Endocrine: Positive for polydipsia. Negative for polyuria.  Genitourinary: Negative for dysuria, frequency and urgency.  Musculoskeletal: Negative for arthralgias, back pain and myalgias.  Skin: Negative for pallor and rash.  Allergic/Immunologic: Negative for  environmental allergies.  Neurological: Negative for dizziness, syncope and headaches.  Hematological: Negative for adenopathy. Does not bruise/bleed easily.  Psychiatric/Behavioral: Negative for decreased concentration and dysphoric mood. The patient is not nervous/anxious.        Objective:   Physical Exam Constitutional:      General: She is not in acute distress.    Appearance: Normal appearance. She is well-developed. She is obese. She is not ill-appearing.  HENT:     Head: Normocephalic and atraumatic.  Eyes:     Conjunctiva/sclera: Conjunctivae normal.     Pupils: Pupils are equal, round, and reactive to light.  Neck:     Thyroid:  No thyromegaly.     Vascular: No carotid bruit or JVD.  Cardiovascular:     Rate and Rhythm: Normal rate and regular rhythm.     Heart sounds: Normal heart sounds. No gallop.   Pulmonary:     Effort: Pulmonary effort is normal. No respiratory distress.     Breath sounds: Normal breath sounds. No wheezing or rales.  Abdominal:     General: Bowel sounds are normal. There is no distension or abdominal bruit.     Palpations: Abdomen is soft. There is no mass.     Tenderness: There is no abdominal tenderness.  Musculoskeletal:     Cervical back: Normal range of motion and neck supple.  Lymphadenopathy:     Cervical: No cervical adenopathy.  Skin:    General: Skin is warm and dry.     Findings: No erythema or rash.     Comments: tanned  Neurological:     Mental Status: She is alert.     Sensory: No sensory deficit.     Coordination: Coordination normal.     Deep Tendon Reflexes: Reflexes are normal and symmetric. Reflexes normal.  Psychiatric:        Mood and Affect: Mood normal.           Assessment & Plan:   Problem List Items Addressed This Visit      Cardiovascular and Mediastinum   Hypertension    bp in fair control at this time  BP Readings from Last 1 Encounters:  10/20/19 122/68   No changes needed Most recent labs  reviewed  Disc lifstyle change with low sodium diet and exercise          Endocrine   Hypothyroidism    Lab Results  Component Value Date   TSH 7.64 (H) 09/19/2019   Now taking her levothyroxine Re check TSH and advise      Relevant Orders   TSH   Hyperlipidemia associated with type 2 diabetes mellitus ([place])    Now on atorvastatin for 1 month Disc goals for lipids and reasons to control them Rev last labs with pt Rev low sat fat diet in detail Labs today       Relevant Medications   glipiZIDE (GLUCOTROL XL) 5 MG 24 hr tablet   Other Relevant Orders   Lipid panel   ALT   AST   Diabetes type 2, uncontrolled ([place]) - Primary    Lab Results  Component Value Date   HGBA1C 12.1 (H) 09/19/2019   On metformin xr  Added glipizide xl 5 mg - in 2 weeks will call with updated glucose results and likely inc to 10 mg  She is self pay- will try first to avoid more $$ medications Disc DM diet and ref done to DM ed (if she can self pay) On ace On statin  Strongly recommend pna 23 and flu vaccines-she will see where she can afford that  Enc use of treadmill F/u 3 mo        Relevant Medications   glipiZIDE (GLUCOTROL XL) 5 MG 24 hr tablet   Other Relevant Orders   Ambulatory referral to diabetic education

## 2019-10-20 NOTE — Patient Instructions (Addendum)
Try to get most of your carbohydrates from produce (with the exception of white potatoes)  Eat less bread/pasta/rice/snack foods/cereals/sweets and other items from the middle of the grocery store (processed carbs)   Start glipizide xl 5 mg with breakfast daily  In 2 weeks let me know how blood sugar results are  Then we will likely increase dose to 10  This can cause low blood sugar- so watch closely   I placed a referral for diabetic teaching - the office will call you   Exercise at least 30 minutes per day Use treadmill when the weather does not permit   Stop socializing with groups during this pandemic Only associate with the people you live with   Get a flu shot and a pneumonia shot at your pharmacy (the pneumovax 23 vaccine is the one you need)

## 2019-10-20 NOTE — Assessment & Plan Note (Signed)
bp in fair control at this time  BP Readings from Last 1 Encounters:  10/20/19 122/68   No changes needed Most recent labs reviewed  Disc lifstyle change with low sodium diet and exercise

## 2019-10-20 NOTE — Assessment & Plan Note (Signed)
Lab Results  Component Value Date   HGBA1C 12.1 (H) 09/19/2019   On metformin xr  Added glipizide xl 5 mg - in 2 weeks will call with updated glucose results and likely inc to 10 mg  She is self pay- will try first to avoid more $$ medications Disc DM diet and ref done to DM ed (if she can self pay) On ace On statin  Strongly recommend pna 23 and flu vaccines-she will see where she can afford that  Enc use of treadmill F/u 3 mo

## 2019-10-20 NOTE — Assessment & Plan Note (Signed)
Now on atorvastatin for 1 month Disc goals for lipids and reasons to control them Rev last labs with pt Rev low sat fat diet in detail Labs today

## 2019-10-20 NOTE — Assessment & Plan Note (Signed)
Lab Results  Component Value Date   TSH 7.64 (H) 09/19/2019   Now taking her levothyroxine Re check TSH and advise

## 2019-10-21 ENCOUNTER — Other Ambulatory Visit: Payer: Self-pay | Admitting: Family Medicine

## 2019-10-21 ENCOUNTER — Telehealth: Payer: Self-pay | Admitting: *Deleted

## 2019-10-21 MED ORDER — ROSUVASTATIN CALCIUM 10 MG PO TABS: 10.0000 mg | ORAL_TABLET | Freq: Every day | ORAL | 3 refills | Status: DC

## 2019-10-21 NOTE — Telephone Encounter (Signed)
Pt notified of Dr. Marliss Coots comments. Rx sent to pharmacy and pt will update [place] if she can't afford it. ?? If she needs f/u labs

## 2019-10-21 NOTE — Telephone Encounter (Signed)
Thanks- I need lipids and ast and alt in about 6 weeks

## 2019-10-21 NOTE — Telephone Encounter (Signed)
-----   Message from Abner Greenspan, MD sent at 10/21/2019  9:57 AM EST ----- I would ultimately like to change over to generic crestor (rosuvastatin)  Please ask her to call pharmacy and see if she could afford it  I would px 10 mg daily  It tends to be more effective at the same dose  Thanks

## 2019-10-22 NOTE — Telephone Encounter (Signed)
appt scheduled

## 2019-10-28 ENCOUNTER — Other Ambulatory Visit: Payer: Self-pay | Admitting: Family Medicine

## 2019-11-19 ENCOUNTER — Other Ambulatory Visit: Payer: Self-pay | Admitting: Family Medicine

## 2019-11-26 ENCOUNTER — Ambulatory Visit: Payer: Self-pay | Admitting: Skilled Nursing Facility1

## 2019-12-01 ENCOUNTER — Telehealth: Payer: Self-pay | Admitting: Family Medicine

## 2019-12-01 DIAGNOSIS — E785 Hyperlipidemia, unspecified: Secondary | ICD-10-CM

## 2019-12-01 DIAGNOSIS — E1169 Type 2 diabetes mellitus with other specified complication: Secondary | ICD-10-CM

## 2019-12-01 NOTE — Telephone Encounter (Signed)
-----   Message from Aquilla Solian, RT sent at 11/21/2019  2:53 PM EST ----- Regarding: Lab Orders for Tuesday 1.26.2021 Please place lab orders for Tuesday 1.26.2021, appt notes state "lipid" Thank you, Jones Bales RT(R)

## 2019-12-02 ENCOUNTER — Telehealth: Payer: Self-pay | Admitting: *Deleted

## 2019-12-02 ENCOUNTER — Other Ambulatory Visit (INDEPENDENT_AMBULATORY_CARE_PROVIDER_SITE_OTHER): Payer: Self-pay

## 2019-12-02 ENCOUNTER — Other Ambulatory Visit: Payer: Self-pay

## 2019-12-02 DIAGNOSIS — E1169 Type 2 diabetes mellitus with other specified complication: Secondary | ICD-10-CM

## 2019-12-02 DIAGNOSIS — E785 Hyperlipidemia, unspecified: Secondary | ICD-10-CM

## 2019-12-02 LAB — LIPID PANEL
Cholesterol: 156 mg/dL (ref 0–200)
HDL: 30.8 mg/dL — ABNORMAL LOW (ref >39.00)
LDL Cholesterol: 86 mg/dL (ref 0–99)
NonHDL: 124.75
Total CHOL/HDL Ratio: 5
Triglycerides: 196 mg/dL — ABNORMAL HIGH (ref 0.0–149.0)
VLDL: 39.2 mg/dL (ref 0.0–40.0)

## 2019-12-02 LAB — AST: AST: 38 U/L — ABNORMAL HIGH (ref 0–37)

## 2019-12-02 LAB — ALT: ALT: 37 U/L — ABNORMAL HIGH (ref 0–35)

## 2019-12-02 MED ORDER — GLIPIZIDE ER 10 MG PO TB24: 10.0000 mg | ORAL_TABLET | Freq: Every day | ORAL | 0 refills | Status: DC

## 2019-12-02 NOTE — Telephone Encounter (Signed)
Pt dropped off a list of BS readings when she came in for labs. BS readings in your inbox

## 2019-12-02 NOTE — Telephone Encounter (Signed)
Thanks- they are high but may be starting to improve Ask her how she is doing with DM diet/exercise lately  She takes glipizide xl 5 mg    I want to inc to glipizide xl 10 mg once daily   (please send in 3 mo supply)  Inc her dose and get me another list of glucose readings in about 2 weeks  Thanks

## 2019-12-02 NOTE — Telephone Encounter (Signed)
Pt notified of Dr. Royden Purl comments. Pt said she is following the DM diet/exercise as closely as she can. She said sometimes she does "slip up" but she said that checking her BS 3 times daily is helping her realize when she has "over done it" and helps her get back on track. Rx for 10mg  sent to pharmacy and pt will update in 2 weeks of new BS readings

## 2019-12-03 ENCOUNTER — Encounter: Payer: Self-pay | Admitting: *Deleted

## 2020-02-04 ENCOUNTER — Other Ambulatory Visit: Payer: Self-pay | Admitting: Family Medicine

## 2020-02-05 NOTE — Telephone Encounter (Signed)
Pt's had a recent appt but last A1c was 09/19/19 it was 12.1 and pt hasn't had a f/u A1c since, please advise

## 2020-02-05 NOTE — Telephone Encounter (Signed)
I refilled once Please schedule a f/u

## 2020-02-13 ENCOUNTER — Ambulatory Visit: Payer: Self-pay | Admitting: Family Medicine

## 2020-02-20 ENCOUNTER — Ambulatory Visit: Payer: Self-pay | Admitting: Family Medicine

## 2020-04-16 ENCOUNTER — Other Ambulatory Visit: Payer: Self-pay | Admitting: Family Medicine

## 2020-07-27 ENCOUNTER — Other Ambulatory Visit: Payer: Self-pay | Admitting: Family Medicine

## 2020-07-27 NOTE — Telephone Encounter (Signed)
Please schedule f/u and refill until then  

## 2020-07-27 NOTE — Telephone Encounter (Signed)
Pt cancelled her DM f/u back in April and never r/s them. Last OV 10/20/19 and no future appts., please advise

## 2020-07-28 NOTE — Telephone Encounter (Signed)
I attempted to call patient to schedule f/u, but voice mail isn't set up.

## 2020-07-29 NOTE — Telephone Encounter (Signed)
Pt called back and Carrie scheduled appt. Med refilled once

## 2020-08-06 ENCOUNTER — Encounter: Payer: Self-pay | Admitting: Family Medicine

## 2020-08-06 ENCOUNTER — Other Ambulatory Visit: Payer: Self-pay

## 2020-08-06 ENCOUNTER — Other Ambulatory Visit: Payer: Self-pay | Admitting: Family Medicine

## 2020-08-06 ENCOUNTER — Ambulatory Visit (INDEPENDENT_AMBULATORY_CARE_PROVIDER_SITE_OTHER): Payer: Self-pay | Admitting: Family Medicine

## 2020-08-06 VITALS — BP 150/96 | HR 85 | Temp 96.8°F | Ht 65.5 in | Wt 197.2 lb

## 2020-08-06 DIAGNOSIS — E039 Hypothyroidism, unspecified: Secondary | ICD-10-CM

## 2020-08-06 DIAGNOSIS — Z6832 Body mass index (BMI) 32.0-32.9, adult: Secondary | ICD-10-CM

## 2020-08-06 DIAGNOSIS — E1165 Type 2 diabetes mellitus with hyperglycemia: Secondary | ICD-10-CM

## 2020-08-06 DIAGNOSIS — E6609 Other obesity due to excess calories: Secondary | ICD-10-CM

## 2020-08-06 DIAGNOSIS — E1169 Type 2 diabetes mellitus with other specified complication: Secondary | ICD-10-CM

## 2020-08-06 DIAGNOSIS — F410 Panic disorder [episodic paroxysmal anxiety] without agoraphobia: Secondary | ICD-10-CM

## 2020-08-06 DIAGNOSIS — E785 Hyperlipidemia, unspecified: Secondary | ICD-10-CM

## 2020-08-06 DIAGNOSIS — I10 Essential (primary) hypertension: Secondary | ICD-10-CM

## 2020-08-06 LAB — TSH: TSH: 6.77 u[IU]/mL — ABNORMAL HIGH (ref 0.35–4.50)

## 2020-08-06 LAB — LIPID PANEL
Cholesterol: 157 mg/dL (ref 0–200)
HDL: 30.8 mg/dL — ABNORMAL LOW (ref >39.00)
NonHDL: 125.9
Total CHOL/HDL Ratio: 5
Triglycerides: 235 mg/dL — ABNORMAL HIGH (ref 0.0–149.0)
VLDL: 47 mg/dL — ABNORMAL HIGH (ref 0.0–40.0)

## 2020-08-06 LAB — COMPREHENSIVE METABOLIC PANEL
ALT: 45 U/L — ABNORMAL HIGH (ref 0–35)
AST: 48 U/L — ABNORMAL HIGH (ref 0–37)
Albumin: 4.3 g/dL (ref 3.5–5.2)
Alkaline Phosphatase: 99 U/L (ref 39–117)
BUN: 11 mg/dL (ref 6–23)
CO2: 27 mEq/L (ref 19–32)
Calcium: 10.6 mg/dL — ABNORMAL HIGH (ref 8.4–10.5)
Chloride: 99 mEq/L (ref 96–112)
Creatinine, Ser: 0.71 mg/dL (ref 0.40–1.20)
GFR: 83.79 mL/min (ref >60.00)
Glucose, Bld: 297 mg/dL — ABNORMAL HIGH (ref 70–99)
Potassium: 4.5 mEq/L (ref 3.5–5.1)
Sodium: 134 mEq/L — ABNORMAL LOW (ref 135–145)
Total Bilirubin: 0.6 mg/dL (ref 0.2–1.2)
Total Protein: 7.2 g/dL (ref 6.0–8.3)

## 2020-08-06 LAB — CBC WITH DIFFERENTIAL/PLATELET
Basophils Absolute: 0.1 10*3/uL (ref 0.0–0.1)
Basophils Relative: 0.6 % (ref 0.0–3.0)
Eosinophils Absolute: 0.3 10*3/uL (ref 0.0–0.7)
Eosinophils Relative: 2.3 % (ref 0.0–5.0)
HCT: 45.4 % (ref 36.0–46.0)
Hemoglobin: 15.1 g/dL — ABNORMAL HIGH (ref 12.0–15.0)
Lymphocytes Relative: 27.8 % (ref 12.0–46.0)
Lymphs Abs: 3.2 10*3/uL (ref 0.7–4.0)
MCHC: 33.2 g/dL (ref 30.0–36.0)
MCV: 89.1 fl (ref 78.0–100.0)
Monocytes Absolute: 0.5 10*3/uL (ref 0.1–1.0)
Monocytes Relative: 4.7 % (ref 3.0–12.0)
Neutro Abs: 7.3 10*3/uL (ref 1.4–7.7)
Neutrophils Relative %: 64.6 % (ref 43.0–77.0)
Platelets: 293 10*3/uL (ref 150.0–400.0)
RBC: 5.1 Mil/uL (ref 3.87–5.11)
RDW: 12.9 % (ref 11.5–15.5)
WBC: 11.4 10*3/uL — ABNORMAL HIGH (ref 4.0–10.5)

## 2020-08-06 LAB — HEMOGLOBIN A1C: Hgb A1c MFr Bld: 13 % — ABNORMAL HIGH (ref 4.6–6.5)

## 2020-08-06 LAB — LDL CHOLESTEROL, DIRECT: Direct LDL: 101 mg/dL

## 2020-08-06 MED ORDER — AMLODIPINE BESYLATE 5 MG PO TABS
5.0000 mg | ORAL_TABLET | Freq: Every day | ORAL | 3 refills | Status: DC
Start: 2020-08-06 — End: 2021-12-05

## 2020-08-06 MED ORDER — PAROXETINE HCL 20 MG PO TABS
20.0000 mg | ORAL_TABLET | Freq: Every day | ORAL | 3 refills | Status: DC
Start: 2020-08-06 — End: 2021-12-05

## 2020-08-06 MED ORDER — LEVOTHYROXINE SODIUM 50 MCG PO TABS
50.0000 ug | ORAL_TABLET | Freq: Every day | ORAL | 3 refills | Status: DC
Start: 2020-08-06 — End: 2020-08-09

## 2020-08-06 MED ORDER — GLIPIZIDE ER 10 MG PO TB24
10.0000 mg | ORAL_TABLET | Freq: Every day | ORAL | 0 refills | Status: DC
Start: 2020-08-06 — End: 2020-12-06

## 2020-08-06 MED ORDER — LISINOPRIL 40 MG PO TABS
40.0000 mg | ORAL_TABLET | Freq: Every day | ORAL | 3 refills | Status: DC
Start: 2020-08-06 — End: 2021-12-05

## 2020-08-06 NOTE — Progress Notes (Signed)
Subjective:    Patient ID: Pam Patel, female    DOB: 02-24-1961, 59 y.o.   MRN: [id number]  This visit occurred during the SARS-CoV-2 public health emergency.  Safety protocols were in place, including screening questions prior to the visit, additional usage of staff PPE, and extensive cleaning of exam room while observing appropriate contact time as indicated for disinfecting solutions.    HPI  Pt presents for f/u of chronic health problems   Wt Readings from Last 3 Encounters:  08/06/20 197 lb 3 oz (89.4 kg)  10/20/19 195 lb 5 oz (88.6 kg)  09/19/19 196 lb 2 oz (89 kg)   32.31 kg/m   Doing well overall  A lot of things in family-kids and husband had covid this summer  Some friends died of suicide (2 friends)   Has not taken care of herself the way she should    HTN BP Readings from Last 3 Encounters:  08/06/20 (!) 150/96  10/20/19 122/68  09/19/19 132/88  she often misses her medication (tries to keep pill box kept up)  Forgets still  Did not take medicine this am   Thinks that is why bp is up  Amlodipine Lisinopril  Metoprolol    Pulse Readings from Last 3 Encounters:  08/06/20 85  10/20/19 80  09/19/19 68    Diabetes 2 Lab Results  Component Value Date   HGBA1C 12.1 (H) 09/19/2019   Overdue for f/u  Was self pay  Considered dm ed  On statin  Glipizide xl 10 Metformin  xr  Taking statin   Not checking blood sugars  Unsure how her control is   Not watching her diet at all- due to stressors    ? 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   Dental disease 03/07/2018   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   Lung nodule 03/07/2018   Hypertension 03/06/2018   Panic disorder 03/06/2018   Thyromegaly 03/06/2018   Diabetes type 2, uncontrolled ([place]) 03/06/2018   Hypothyroidism 10/05/2008   STRESS REACTION, ACUTE, WITH EMOTIONAL DISTURBANCE 10/05/2008   Hyperlipidemia associated with type 2 diabetes mellitus ([place]) 01/15/2008   Former smoker 01/15/2008   ALLERGIC RHINITIS, SEASONAL 01/15/2008   DEGENERATIVE [place] DISEASE, 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   Past Medical History:  Diagnosis Date   History of chickenpox    History of diabetes mellitus, type II    History of hypertension    Hypertension    Past Surgical History:  Procedure Laterality Date   APPENDECTOMY  1990   BACK SURGERY     Social History   Tobacco Use   Smoking status: Former Smoker   Smokeless tobacco: Never Used  Substance Use Topics   Alcohol use: Not Currently   Drug use: Never   Family History  Problem Relation Age of Onset   COPD Mother    Diabetes  Mother    Heart attack Mother    Heart disease Mother    Hypertension Mother    Stroke Mother    Heart attack Father    Allergies  Allergen Reactions   Penicillins Hives    Has patient had a PCN reaction causing immediate rash, facial/tongue/throat swelling, SOB or lightheadedness with hypotension: Unknown Has patient had a PCN reaction causing severe rash involving mucus membranes or skin necrosis: No Has patient had a PCN reaction that required hospitalization: No Has patient had a PCN reaction occurring within the last 10 years: No If all of the above answers are "NO", then may proceed with Cephalosporin use.    Naproxen     REACTION: hives   Current Outpatient Medications on File Prior to Visit  Medication Sig  Dispense Refill   aspirin EC 81 MG tablet Take 81 mg by mouth daily.      blood glucose meter kit and supplies KIT Dispense based on patient and insurance preference. Use up to four times daily as directed. (FOR ICD-9 250.00, 250.01). 1 each 0   metFORMIN (GLUCOPHAGE-XR) 500 MG 24 hr tablet TAKE 2 PILLS BY MOUTH EVERY MORNING AND 1 PILL EVERY PEVENING 270 tablet 0   metoprolol tartrate (LOPRESSOR) 50 MG tablet Take 1 tablet (50 mg total) by mouth 2 (two) times daily. 180 tablet 3   rosuvastatin (CRESTOR) 10 MG tablet Take 1 tablet (10 mg total) by mouth daily. 90 tablet 3   No current facility-administered medications on file prior to visit.    Review of Systems  Constitutional: Positive for fatigue. Negative for activity change, appetite change, fever and unexpected weight change.  HENT: Negative for congestion, ear pain, rhinorrhea, sinus pressure and sore throat.   Eyes: Negative for pain, redness and visual disturbance.  Respiratory: Negative for cough, shortness of breath and wheezing.   Cardiovascular: Negative for chest pain and palpitations.  Gastrointestinal: Negative for abdominal pain, blood in stool, constipation and diarrhea.  Endocrine: Negative for polydipsia and polyuria.  Genitourinary: Negative for dysuria, frequency and urgency.  Musculoskeletal: Negative for arthralgias, back pain and myalgias.  Skin: Negative for pallor and rash.  Allergic/Immunologic: Negative for environmental allergies.  Neurological: Negative for dizziness, syncope and headaches.  Hematological: Negative for adenopathy. Does not bruise/bleed easily.  Psychiatric/Behavioral: Negative for decreased concentration, dysphoric mood and sleep disturbance. The patient is nervous/anxious.        Objective:   Physical Exam Constitutional:      General: She is not in acute distress.    Appearance: Normal appearance. She is well-developed. She is obese. She is not ill-appearing or diaphoretic.  HENT:      Head: Normocephalic and atraumatic.  Eyes:     Conjunctiva/sclera: Conjunctivae normal.     Pupils: Pupils are equal, round, and reactive to light.  Neck:     Thyroid: No thyromegaly.     Vascular: No carotid bruit or JVD.     Comments: Enlarged thyroid/baseline Cardiovascular:     Rate and Rhythm: Normal rate and regular rhythm.     Heart sounds: Normal heart sounds. No gallop.   Pulmonary:     Effort: Pulmonary effort is normal. No respiratory distress.     Breath sounds: Normal breath sounds. No wheezing or rales.  Abdominal:     General: Bowel sounds are normal. There is no distension or abdominal bruit.     Palpations: Abdomen is soft. There is no mass.     Tenderness: There is no  abdominal tenderness.  Musculoskeletal:     Cervical back: Normal range of motion and neck supple. No tenderness.     Right lower leg: No edema.     Left lower leg: No edema.  Lymphadenopathy:     Cervical: No cervical adenopathy.  Skin:    General: Skin is warm and dry.     Coloration: Skin is not pale.     Findings: No erythema or rash.  Neurological:     Mental Status: She is alert.     Sensory: No sensory deficit.     Coordination: Coordination normal.     Deep Tendon Reflexes: Reflexes are normal and symmetric. Reflexes normal.  Psychiatric:        Mood and Affect: Mood normal.     Comments: Voices concern with stressors            Assessment & Plan:   Problem List Items Addressed This Visit      Cardiovascular and Mediastinum   Hypertension    bp is elevated today BP Readings from Last 1 Encounters:  08/06/20 (!) 150/96   Has missed medication /did not take today as well Supposed to take amlodipine and lisinopril and metoprolol Enc to be more compliant and disc ways to set reminders Most recent labs reviewed  Disc lifstyle change with low sodium diet and exercise  F/u 3 mo      Relevant Medications   amLODipine (NORVASC) 5 MG tablet   lisinopril (ZESTRIL) 40 MG  tablet   Other Relevant Orders   CBC with Differential/Platelet (Completed)   Comprehensive metabolic panel (Completed)   Lipid panel (Completed)   TSH (Completed)     Endocrine   Hypothyroidism    Pt states the levothyroxine is the only medicine she is complaint with  Not a lot of clinical change Will check TSH today      Relevant Medications   levothyroxine (SYNTHROID) 50 MCG tablet   Hyperlipidemia associated with type 2 diabetes mellitus ([place])    Will check today  Has missed doses of statin  Last time LDL was 86 Disc goals for lipids and reasons to control them Rev last labs with pt Rev low sat fat diet in detail       Relevant Medications   glipiZIDE (GLUCOTROL XL) 10 MG 24 hr tablet   lisinopril (ZESTRIL) 40 MG tablet   Other Relevant Orders   Lipid panel (Completed)   Diabetes type 2, uncontrolled ([place]) - Primary    Not compliant with medicine and also eating poorly  Says she is ready to get back on track On metformin and glipzide No insurance and cannot afford medications always  Taking a statin and ace  Will check a1c today-expect high  Will start checking blood glucose levels F/u 3 mo      Relevant Medications   glipiZIDE (GLUCOTROL XL) 10 MG 24 hr tablet   lisinopril (ZESTRIL) 40 MG tablet   Other Relevant Orders   Hemoglobin A1c (Completed)     Other   Panic disorder    Pt thinks paxil works well  More panic attacks lately due to situational stress however  Wants to remain on current dose Stressed importance of exercise and self care      Relevant Medications   PARoxetine (PAXIL) 20 MG tablet   Class 1 obesity due to excess calories with serious comorbidity and body mass index (BMI) of 32.0 to 32.9 in adult    Discussed how this problem influences  overall health and the risks it imposes  Reviewed plan for weight loss with lower calorie diet (via better food choices and also portion control or program like weight watchers) and exercise building up  to or more than 30 minutes 5 days per week including some aerobic activity         Relevant Medications   glipiZIDE (GLUCOTROL XL) 10 MG 24 hr tablet

## 2020-08-06 NOTE — Patient Instructions (Addendum)
Get your flu shot at [facility]  Consider a covid vaccine booster next month   Set a reminder or alarm on cell phone for medicines   Get back to checking your blood sugar levels   Labs today   Follow up 3 months

## 2020-08-08 DIAGNOSIS — E6609 Other obesity due to excess calories: Secondary | ICD-10-CM | POA: Insufficient documentation

## 2020-08-08 DIAGNOSIS — Z6831 Body mass index (BMI) 31.0-31.9, adult: Secondary | ICD-10-CM | POA: Insufficient documentation

## 2020-08-08 DIAGNOSIS — E669 Obesity, unspecified: Secondary | ICD-10-CM | POA: Insufficient documentation

## 2020-08-08 NOTE — Assessment & Plan Note (Signed)
Discussed how this problem influences overall health and the risks it imposes  Reviewed plan for weight loss with lower calorie diet (via better food choices and also portion control or program like weight watchers) and exercise building up to or more than 30 minutes 5 days per week including some aerobic activity    

## 2020-08-08 NOTE — Assessment & Plan Note (Signed)
Pt states the levothyroxine is the only medicine she is complaint with  Not a lot of clinical change Will check TSH today

## 2020-08-08 NOTE — Assessment & Plan Note (Addendum)
bp is elevated today BP Readings from Last 1 Encounters:  08/06/20 (!) 150/96   Has missed medication /did not take today as well Supposed to take amlodipine and lisinopril and metoprolol Enc to be more compliant and disc ways to set reminders Most recent labs reviewed  Disc lifstyle change with low sodium diet and exercise  F/u 3 mo

## 2020-08-08 NOTE — Assessment & Plan Note (Signed)
Not compliant with medicine and also eating poorly  Says she is ready to get back on track On metformin and glipzide No insurance and cannot afford medications always  Taking a statin and ace  Will check a1c today-expect high  Will start checking blood glucose levels F/u 3 mo

## 2020-08-08 NOTE — Assessment & Plan Note (Signed)
Pt thinks paxil works well  More panic attacks lately due to situational stress however  Wants to remain on current dose Stressed importance of exercise and self care

## 2020-08-08 NOTE — Assessment & Plan Note (Signed)
Will check today  Has missed doses of statin  Last time LDL was 86 Disc goals for lipids and reasons to control them Rev last labs with pt Rev low sat fat diet in detail

## 2020-08-09 ENCOUNTER — Telehealth: Payer: Self-pay | Admitting: *Deleted

## 2020-08-09 MED ORDER — LEVOTHYROXINE SODIUM 75 MCG PO TABS: 75.0000 ug | ORAL_TABLET | Freq: Every day | ORAL | 0 refills | Status: DC

## 2020-08-09 NOTE — Telephone Encounter (Signed)
Pt notified of lab results and Dr. Royden Purl comments. Pt will work on diet and keep a track of BS and update [place]. Pt said she doesn't have any sxs of infection, no UTI or URI sxs. Pt also said she hasn't missed any doses of her thyroid med so if Rx needs to be increased she uses [facility] [address]  FYI pt doesn't have a f/u appt scheduled

## 2020-08-09 NOTE — Telephone Encounter (Signed)
-----   Message from Judy Pimple, MD sent at 08/08/2020 10:30 AM EDT ----- Cholesterol is improved (but trig are up due to high sugar) TSH is up - if she has not missed ANY doses then I can increase it  A1C up to 13.0 -worrisome, please let me know how glucose levels are in about 2 weeks when taking medication regularly and eating better Wbc is slt elevated (this is not unusual for her) but if any s/s of infection let me know Liver tests are up - may be due to diet-will discuss further at f/u

## 2020-08-09 NOTE — Telephone Encounter (Signed)
I sent to her pharmacy  We will re check TSH the next time she is here  Do update with blood sugar results as we planned

## 2020-08-10 NOTE — Telephone Encounter (Signed)
Pt notified of Dr. Royden Purl comments. Pt will update [place] on BS as planned. Pt did want to let Dr. Milinda Antis know that she forgot that she has had a recent yeast inf. And that could be the reason why her WBC was elevated. Pt said she did treat it with diflucan today but she will keep [place] posted if sxs don't resolve. Pt had vaginal itching and discharge.   FYI to PCP

## 2020-09-20 ENCOUNTER — Other Ambulatory Visit: Payer: Self-pay | Admitting: Family Medicine

## 2020-10-14 ENCOUNTER — Other Ambulatory Visit: Payer: Self-pay | Admitting: Family Medicine

## 2020-12-03 ENCOUNTER — Other Ambulatory Visit: Payer: Self-pay | Admitting: Family Medicine

## 2020-12-06 MED ORDER — ROSUVASTATIN CALCIUM 10 MG PO TABS: 10.0000 mg | ORAL_TABLET | Freq: Every day | ORAL | 2 refills | Status: DC

## 2021-06-11 ENCOUNTER — Other Ambulatory Visit: Payer: Self-pay | Admitting: Family Medicine

## 2021-08-22 ENCOUNTER — Other Ambulatory Visit: Payer: Self-pay | Admitting: Family Medicine

## 2021-08-23 NOTE — Telephone Encounter (Signed)
Pt hasn't been seen in over a year, please schedule a CPE or at least a med refill f/u appt and then route back to me to fill Rx

## 2021-08-23 NOTE — Telephone Encounter (Signed)
Called patient no answer.

## 2021-08-24 NOTE — Telephone Encounter (Signed)
2nd attempt to call pt for cpe/lab  and vm not set up

## 2021-12-01 ENCOUNTER — Other Ambulatory Visit: Payer: Self-pay | Admitting: Family Medicine

## 2021-12-05 ENCOUNTER — Telehealth: Payer: Self-pay | Admitting: Family Medicine

## 2021-12-05 MED ORDER — LISINOPRIL 40 MG PO TABS: 40.0000 mg | ORAL_TABLET | Freq: Every day | ORAL | 0 refills | Status: DC

## 2021-12-05 MED ORDER — PAROXETINE HCL 20 MG PO TABS: 20.0000 mg | ORAL_TABLET | Freq: Every day | ORAL | 0 refills | Status: DC

## 2021-12-05 MED ORDER — LEVOTHYROXINE SODIUM 75 MCG PO TABS: 75.0000 ug | ORAL_TABLET | Freq: Every day | ORAL | 0 refills | Status: DC

## 2021-12-05 MED ORDER — AMLODIPINE BESYLATE 5 MG PO TABS: 5.0000 mg | ORAL_TABLET | Freq: Every day | ORAL | 0 refills | Status: DC

## 2021-12-05 NOTE — Telephone Encounter (Signed)
°  Encourage patient to contact the pharmacy for refills or they can request refills through [facility]  Did the patient contact the pharmacy:  N   LAST APPOINTMENT DATE:  10.1.21  NEXT APPOINTMENT DATE: 2.13.23 CPE  MEDICATION:levothyroxine (SYNTHROID) 75 MCG tablet  PARoxetine (PAXIL) 20 MG tablet   amLODipine (NORVASC) 5 MG tablet   lisinopril (ZESTRIL) 40 MG tablet    Is the patient out of medication?  Y  If not, how much is left? None, patient is asking for an small refill amount to get her to her appointment  Is this a 48 day supply:   [id number] [address], [place] Phone:  [phone]  Fax:  [phone]      Let patient know to contact pharmacy at the end of the day to make sure medication is ready.  Please notify patient to allow 48-72 hours to process

## 2021-12-19 ENCOUNTER — Other Ambulatory Visit: Payer: Self-pay

## 2021-12-19 ENCOUNTER — Ambulatory Visit (INDEPENDENT_AMBULATORY_CARE_PROVIDER_SITE_OTHER): Payer: Self-pay | Admitting: Family Medicine

## 2021-12-19 ENCOUNTER — Encounter: Payer: Self-pay | Admitting: Family Medicine

## 2021-12-19 VITALS — BP 138/76 | HR 76 | Temp 97.6°F | Ht 66.0 in | Wt 194.0 lb

## 2021-12-19 DIAGNOSIS — E6609 Other obesity due to excess calories: Secondary | ICD-10-CM

## 2021-12-19 DIAGNOSIS — Z6831 Body mass index (BMI) 31.0-31.9, adult: Secondary | ICD-10-CM

## 2021-12-19 DIAGNOSIS — E119 Type 2 diabetes mellitus without complications: Secondary | ICD-10-CM

## 2021-12-19 DIAGNOSIS — E039 Hypothyroidism, unspecified: Secondary | ICD-10-CM

## 2021-12-19 DIAGNOSIS — F410 Panic disorder [episodic paroxysmal anxiety] without agoraphobia: Secondary | ICD-10-CM

## 2021-12-19 DIAGNOSIS — E1169 Type 2 diabetes mellitus with other specified complication: Secondary | ICD-10-CM

## 2021-12-19 DIAGNOSIS — E785 Hyperlipidemia, unspecified: Secondary | ICD-10-CM

## 2021-12-19 DIAGNOSIS — I10 Essential (primary) hypertension: Secondary | ICD-10-CM

## 2021-12-19 NOTE — Assessment & Plan Note (Signed)
Discussed how this problem influences overall health and the risks it imposes  Reviewed plan for weight loss with lower calorie diet (via better food choices and also portion control or program like weight watchers) and exercise building up to or more than 30 minutes 5 days per week including some aerobic activity   Pt seems more motivated for change now  Following along [employer] program with her husband

## 2021-12-19 NOTE — Assessment & Plan Note (Signed)
Disc goals for lipids and reasons to control them Rev last labs with pt Rev low sat fat diet in detail  Labs ordered Pt notes compliance with crestor 10 mg daily  Diet has been bad until a month ago

## 2021-12-19 NOTE — Assessment & Plan Note (Signed)
TSH ordered  Supposed to be on levothyroxine 75 mcg daily  Has missed doses recently so more than likely it will be elevated No changes clinically

## 2021-12-19 NOTE — Assessment & Plan Note (Signed)
Per pt doing well Continues paxil 20 mg daily  Tolerates it well

## 2021-12-19 NOTE — Assessment & Plan Note (Signed)
A1C ordered Pt has been back on track for a month (after being lost to f/u for over a year)  Diet is better now  Some exercise Good bp control  Taking ace and statin  Agrees to try DM teaching  Due for eye exam- enc to schedule that  Excellent foot care  Taking glipizide xl 10 mg daily and metformin XR 1000 mg bid  F/u planned in 3 mo

## 2021-12-19 NOTE — Progress Notes (Signed)
Subjective:    Patient ID: Pam Patel, female    DOB: [birth year], 61 y.o.   MRN: [id number]  This visit occurred during the SARS-CoV-2 public health emergency.  Safety protocols were in place, including screening questions prior to the visit, additional usage of staff PPE, and extensive cleaning of exam room while observing appropriate contact time as indicated for disinfecting solutions.   HPI Here for f/u of chronic health problems   Wt Readings from Last 3 Encounters:  12/19/21 194 lb (88 kg)  08/06/20 197 lb 3 oz (89.4 kg)  10/20/19 195 lb 5 oz (88.6 kg)   31.31 kg/m  She declines health mt/screening of any kind  Went snow tubing this weekend- had fun Taking care of herself   Dropped the ball for her health  Was not a priority for a bit   Got frustrated because she "can't eat anything" and got disgusted     HTN bp is stable today  No cp or palpitations or headaches or edema  No side effects to medicines  BP Readings from Last 3 Encounters:  12/19/21 138/76  08/06/20 (!) 150/96  10/20/19 122/68      Amlodipine 5 mg daily  Lisinopril 40 mg daily  Metoprolol 50 mg bid  Hypothyroidism  Pt has no clinical changes No change in energy level/ hair or skin/ edema and no tremor Lab Results  Component Value Date   TSH 6.77 (H) 08/06/2020     Levothyroxine 75 mcg daily  Missed some doses   Mood Takes paxil 20 mg daily     DM2 Lab Results  Component Value Date   HGBA1C 13.0 (H) 08/06/2020    Glipizide xl 10 mg daily  Metformin xr 1000 mg bid   Recently started working on diet again Husband is in the [employer] program so she is following along  Doing this for 1 month  (prior to this eating smaller portions)  Unsure how her a1c will look   Does not check blood sugars  Wants to start    Now: No pasta No sweets (some sugar free sweet products) No bread except for multi grain Little rice (some cauliflower) Lots of veggies     Some exercise-walks  her dogs 45 minutes daily  Lot of gardening in season  Gets in the pool in the summer    Hyperlipidemia Lab Results  Component Value Date   CHOL 157 08/06/2020   HDL 30.80 (L) 08/06/2020   LDLCALC 86 12/02/2019   LDLDIRECT 101.0 08/06/2020   TRIG 235.0 (H) 08/06/2020   CHOLHDL 5 08/06/2020   Takes crestor 10 mg daily  No missed doses    Lab Results  Component Value Date   ALT 45 (H) 08/06/2020   AST 48 (H) 08/06/2020   ALKPHOS 99 08/06/2020   BILITOT 0.6 08/06/2020   No alcohol No tylenol    Lab Results  Component Value Date   WBC 11.4 (H) 08/06/2020   HGB 15.1 (H) 08/06/2020   HCT 45.4 08/06/2020   MCV 89.1 08/06/2020   PLT 293.0 08/06/2020   Last visit wbc up  Lost to follow up   Dental health  Doing ok right now-no infections   Patient Active Problem List   Diagnosis Date Noted   Class 1 obesity due to excess calories with serious comorbidity and body mass index (BMI) of 31.0 to 31.9 in adult 08/08/2020   Hot flashes 08/28/2018   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   Dental disease 03/07/2018  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   Lung nodule 03/07/2018   Hypertension 03/06/2018   Panic disorder 03/06/2018   Thyromegaly 03/06/2018   DM (diabetes mellitus), type 2 ([place]) 03/06/2018   Hypothyroidism 10/05/2008   STRESS REACTION, ACUTE, WITH EMOTIONAL DISTURBANCE 10/05/2008   Hyperlipidemia associated with type 2 diabetes mellitus ([place]) 01/15/2008   Former smoker 01/15/2008   ALLERGIC RHINITIS, SEASONAL 01/15/2008   DEGENERATIVE [place] DISEASE, 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   Past Medical History:  Diagnosis Date   History of chickenpox    History of diabetes mellitus, type II    History of hypertension    Hypertension    Past Surgical History:  Procedure Laterality Date   APPENDECTOMY  1990   BACK SURGERY     Social History   Tobacco Use   Smoking status: Former   Smokeless tobacco: Never  Substance Use Topics   Alcohol use: Not Currently   Drug use:  Never   Family History  Problem Relation Age of Onset   COPD Mother    Diabetes Mother    Heart attack Mother    Heart disease Mother    Hypertension Mother    Stroke Mother    Heart attack Father    Allergies  Allergen Reactions   Penicillins Hives    Has patient had a PCN reaction causing immediate rash, facial/tongue/throat swelling, SOB or lightheadedness with hypotension: Unknown Has patient had a PCN reaction causing severe rash involving mucus membranes or skin necrosis: No Has patient had a PCN reaction that required hospitalization: No Has patient had a PCN reaction occurring within the last 10 years: No If all of the above answers are "NO", then may proceed with Cephalosporin use.    Naproxen     REACTION: hives   Current Outpatient Medications on File Prior to Visit  Medication Sig Dispense Refill   amLODipine (NORVASC) 5 MG tablet Take 1 tablet (5 mg total) by mouth daily. 90 tablet 0   aspirin EC 81 MG tablet Take 81 mg by mouth daily.      blood glucose meter kit and supplies KIT Dispense based on patient and insurance preference. Use up to four times daily as directed. (FOR ICD-9 250.00, 250.01). 1 each 0   glipiZIDE (GLUCOTROL XL) 10 MG 24 hr tablet TAKE 1 TABLET (10 MG TOTAL) BY MOUTH DAILY WITH BREAKFAST. 90 tablet 2   levothyroxine (SYNTHROID) 75 MCG tablet Take 1 tablet (75 mcg total) by mouth daily before breakfast. 90 tablet 0   lisinopril (ZESTRIL) 40 MG tablet Take 1 tablet (40 mg total) by mouth daily. 90 tablet 0   metFORMIN (GLUCOPHAGE-XR) 500 MG 24 hr tablet TAKE 2 PILLS BY MOUTH EVERY MORNING AND 1 PILL EVERY PEVENING 270 tablet 2   metoprolol tartrate (LOPRESSOR) 50 MG tablet TAKE 1 TABLET BY MOUTH TWICE A DAY 180 tablet 2   PARoxetine (PAXIL) 20 MG tablet Take 1 tablet (20 mg total) by mouth daily. 90 tablet 0   rosuvastatin (CRESTOR) 10 MG tablet Take 1 tablet (10 mg total) by mouth daily. 90 tablet 2   No current facility-administered medications  on file prior to visit.    Review of Systems  Constitutional:  Positive for fatigue. Negative for activity change, appetite change, fever and unexpected weight change.  HENT:  Negative for congestion, ear pain, rhinorrhea, sinus pressure and sore throat.   Eyes:  Negative for pain, redness and visual disturbance.  Respiratory:  Negative for cough, shortness of breath and wheezing.  Cardiovascular:  Negative for chest pain and palpitations.  Gastrointestinal:  Negative for abdominal pain, blood in stool, constipation and diarrhea.  Endocrine: Negative for polydipsia and polyuria.  Genitourinary:  Negative for dysuria, frequency and urgency.  Musculoskeletal:  Negative for arthralgias, back pain and myalgias.  Skin:  Negative for pallor and rash.  Allergic/Immunologic: Negative for environmental allergies.  Neurological:  Negative for dizziness, syncope and headaches.  Hematological:  Negative for adenopathy. Does not bruise/bleed easily.  Psychiatric/Behavioral:  Negative for decreased concentration and dysphoric mood. The patient is not nervous/anxious.       Objective:   Physical Exam Constitutional:      General: She is not in acute distress.    Appearance: Normal appearance. She is well-developed. She is obese. She is not ill-appearing or diaphoretic.  HENT:     Head: Normocephalic and atraumatic.  Eyes:     General: No scleral icterus.    Conjunctiva/sclera: Conjunctivae normal.     Pupils: Pupils are equal, round, and reactive to light.  Neck:     Thyroid: No thyromegaly.     Vascular: No carotid bruit or JVD.  Cardiovascular:     Rate and Rhythm: Normal rate and regular rhythm.     Pulses: Normal pulses.     Heart sounds: Normal heart sounds.    No gallop.  Pulmonary:     Effort: Pulmonary effort is normal. No respiratory distress.     Breath sounds: Normal breath sounds. No wheezing or rales.  Abdominal:     General: Bowel sounds are normal. There is no distension or  abdominal bruit.     Palpations: Abdomen is soft. There is no mass.     Tenderness: There is no abdominal tenderness.  Musculoskeletal:     Cervical back: Normal range of motion and neck supple.     Right lower leg: No edema.     Left lower leg: No edema.  Lymphadenopathy:     Cervical: No cervical adenopathy.  Skin:    General: Skin is warm and dry.     Coloration: Skin is not pale.     Findings: No rash.  Neurological:     Mental Status: She is alert.     Coordination: Coordination normal.     Deep Tendon Reflexes: Reflexes are normal and symmetric. Reflexes normal.  Psychiatric:        Mood and Affect: Mood normal.        Cognition and Memory: Cognition normal.     Comments: Mood is good          Assessment & Plan:   Problem List Items Addressed This Visit       Cardiovascular and Mediastinum   Hypertension    bp in fair control at this time  BP Readings from Last 1 Encounters:  12/19/21 138/76  No changes needed Most recent labs reviewed , labs ordered today Disc lifstyle change with low sodium diet and exercise  Plan to continue  Amlodipine 5 mg daily  Lisinopril 40 mg daily  Metoprolol 50 mg bid      Relevant Orders   Comprehensive metabolic panel   CBC with Differential/Platelet   Lipid panel   TSH     Endocrine   DM (diabetes mellitus), type 2 ([place]) - Primary    A1C ordered Pt has been back on track for a month (after being lost to f/u for over a year)  Diet is better now  Some exercise Good bp control  Taking ace and statin  Agrees to try DM teaching  Due for eye exam- enc to schedule that  Excellent foot care  Taking glipizide xl 10 mg daily and metformin XR 1000 mg bid  F/u planned in 3 mo      Relevant Orders   Ambulatory referral to diabetic education   Hemoglobin A1c   Hyperlipidemia associated with type 2 diabetes mellitus ([place])    Disc goals for lipids and reasons to control them Rev last labs with pt Rev low sat fat diet in  detail  Labs ordered Pt notes compliance with crestor 10 mg daily  Diet has been bad until a month ago      Relevant Orders   Lipid panel   Hypothyroidism    TSH ordered  Supposed to be on levothyroxine 75 mcg daily  Has missed doses recently so more than likely it will be elevated No changes clinically      Relevant Orders   TSH     Other   Class 1 obesity due to excess calories with serious comorbidity and body mass index (BMI) of 31.0 to 31.9 in adult    Discussed how this problem influences overall health and the risks it imposes  Reviewed plan for weight loss with lower calorie diet (via better food choices and also portion control or program like weight watchers) and exercise building up to or more than 30 minutes 5 days per week including some aerobic activity   Pt seems more motivated for change now  Following along [place] program with her husband      Panic disorder    Per pt doing well Continues paxil 20 mg daily  Tolerates it well

## 2021-12-19 NOTE — Assessment & Plan Note (Signed)
bp in fair control at this time  BP Readings from Last 1 Encounters:  12/19/21 138/76   No changes needed Most recent labs reviewed , labs ordered today Disc lifstyle change with low sodium diet and exercise  Plan to continue  Amlodipine 5 mg daily  Lisinopril 40 mg daily  Metoprolol 50 mg bid

## 2021-12-19 NOTE — Patient Instructions (Addendum)
I placed a referral to diabetic education  You will get a call   Check glucose in am (fasting) and 2 hours after a meal (different meals) and keep a log   Go ahead with the diet change and work on exercise   Labs today   Follow up here in 3 months

## 2021-12-20 LAB — COMPREHENSIVE METABOLIC PANEL
ALT: 37 U/L — ABNORMAL HIGH (ref 0–35)
AST: 45 U/L — ABNORMAL HIGH (ref 0–37)
Albumin: 4.2 g/dL (ref 3.5–5.2)
Alkaline Phosphatase: 96 U/L (ref 39–117)
BUN: 11 mg/dL (ref 6–23)
CO2: 29 mEq/L (ref 19–32)
Calcium: 10.3 mg/dL (ref 8.4–10.5)
Chloride: 101 mEq/L (ref 96–112)
Creatinine, Ser: 0.71 mg/dL (ref 0.40–1.20)
GFR: 91.39 mL/min (ref >60.00)
Glucose, Bld: 300 mg/dL — ABNORMAL HIGH (ref 70–99)
Potassium: 4.2 mEq/L (ref 3.5–5.1)
Sodium: 134 mEq/L — ABNORMAL LOW (ref 135–145)
Total Bilirubin: 0.5 mg/dL (ref 0.2–1.2)
Total Protein: 6.9 g/dL (ref 6.0–8.3)

## 2021-12-20 LAB — TSH: TSH: 3.65 u[IU]/mL (ref 0.35–5.50)

## 2021-12-20 LAB — CBC WITH DIFFERENTIAL/PLATELET
Basophils Absolute: 0.1 10*3/uL (ref 0.0–0.1)
Basophils Relative: 1.3 % (ref 0.0–3.0)
Eosinophils Absolute: 0.2 10*3/uL (ref 0.0–0.7)
Eosinophils Relative: 1.8 % (ref 0.0–5.0)
HCT: 43.3 % (ref 36.0–46.0)
Hemoglobin: 14.6 g/dL (ref 12.0–15.0)
Lymphocytes Relative: 24.7 % (ref 12.0–46.0)
Lymphs Abs: 2.8 10*3/uL (ref 0.7–4.0)
MCHC: 33.7 g/dL (ref 30.0–36.0)
MCV: 87.7 fl (ref 78.0–100.0)
Monocytes Absolute: 0.6 10*3/uL (ref 0.1–1.0)
Monocytes Relative: 5 % (ref 3.0–12.0)
Neutro Abs: 7.5 10*3/uL (ref 1.4–7.7)
Neutrophils Relative %: 67.2 % (ref 43.0–77.0)
Platelets: 280 10*3/uL (ref 150.0–400.0)
RBC: 4.94 Mil/uL (ref 3.87–5.11)
RDW: 12.4 % (ref 11.5–15.5)
WBC: 11.2 10*3/uL — ABNORMAL HIGH (ref 4.0–10.5)

## 2021-12-20 LAB — HEMOGLOBIN A1C: Hgb A1c MFr Bld: 11.9 % — ABNORMAL HIGH (ref 4.6–6.5)

## 2021-12-20 LAB — LDL CHOLESTEROL, DIRECT: Direct LDL: 176 mg/dL

## 2021-12-20 LAB — LIPID PANEL
Cholesterol: 248 mg/dL — ABNORMAL HIGH (ref 0–200)
HDL: 34.6 mg/dL — ABNORMAL LOW (ref >39.00)
Total CHOL/HDL Ratio: 7
Triglycerides: 499 mg/dL — ABNORMAL HIGH (ref 0.0–149.0)

## 2022-01-09 ENCOUNTER — Other Ambulatory Visit: Payer: Self-pay | Admitting: Family Medicine

## 2022-02-25 ENCOUNTER — Other Ambulatory Visit: Payer: Self-pay | Admitting: Family Medicine

## 2022-03-04 ENCOUNTER — Other Ambulatory Visit: Payer: Self-pay | Admitting: Family Medicine

## 2022-03-06 NOTE — Telephone Encounter (Signed)
Is this okay to refill? 

## 2022-04-20 ENCOUNTER — Other Ambulatory Visit: Payer: Self-pay | Admitting: Family Medicine

## 2022-04-24 ENCOUNTER — Ambulatory Visit: Payer: Self-pay | Admitting: Family Medicine

## 2022-05-02 ENCOUNTER — Ambulatory Visit: Payer: Self-pay | Admitting: Family Medicine

## 2022-05-18 ENCOUNTER — Ambulatory Visit (INDEPENDENT_AMBULATORY_CARE_PROVIDER_SITE_OTHER): Payer: Self-pay | Admitting: Family Medicine

## 2022-05-18 ENCOUNTER — Encounter: Payer: Self-pay | Admitting: Family Medicine

## 2022-05-18 ENCOUNTER — Other Ambulatory Visit: Payer: Self-pay

## 2022-05-18 VITALS — BP 122/80 | HR 96 | Ht 66.0 in | Wt 187.8 lb

## 2022-05-18 DIAGNOSIS — E039 Hypothyroidism, unspecified: Secondary | ICD-10-CM

## 2022-05-18 DIAGNOSIS — E785 Hyperlipidemia, unspecified: Secondary | ICD-10-CM

## 2022-05-18 DIAGNOSIS — E1140 Type 2 diabetes mellitus with diabetic neuropathy, unspecified: Secondary | ICD-10-CM

## 2022-05-18 DIAGNOSIS — E1169 Type 2 diabetes mellitus with other specified complication: Secondary | ICD-10-CM

## 2022-05-18 DIAGNOSIS — E010 Iodine-deficiency related diffuse (endemic) goiter: Secondary | ICD-10-CM

## 2022-05-18 DIAGNOSIS — E119 Type 2 diabetes mellitus without complications: Secondary | ICD-10-CM

## 2022-05-18 DIAGNOSIS — I10 Essential (primary) hypertension: Secondary | ICD-10-CM

## 2022-05-18 LAB — COMPREHENSIVE METABOLIC PANEL
ALT: 36 U/L — ABNORMAL HIGH (ref 0–35)
AST: 39 U/L — ABNORMAL HIGH (ref 0–37)
Albumin: 4.1 g/dL (ref 3.5–5.2)
Alkaline Phosphatase: 94 U/L (ref 39–117)
BUN: 13 mg/dL (ref 6–23)
CO2: 25 mEq/L (ref 19–32)
Calcium: 10.2 mg/dL (ref 8.4–10.5)
Chloride: 99 mEq/L (ref 96–112)
Creatinine, Ser: 0.73 mg/dL (ref 0.40–1.20)
GFR: 88.14 mL/min (ref >60.00)
Glucose, Bld: 316 mg/dL — ABNORMAL HIGH (ref 70–99)
Potassium: 4.4 mEq/L (ref 3.5–5.1)
Sodium: 134 mEq/L — ABNORMAL LOW (ref 135–145)
Total Bilirubin: 0.5 mg/dL (ref 0.2–1.2)
Total Protein: 7.1 g/dL (ref 6.0–8.3)

## 2022-05-18 LAB — HEMOGLOBIN A1C: Hgb A1c MFr Bld: 13 % — ABNORMAL HIGH (ref 4.6–6.5)

## 2022-05-18 MED ORDER — INSULIN GLARGINE 100 UNIT/ML ~~LOC~~ SOLN: 10.0000 [IU] | Freq: Every day | SUBCUTANEOUS | 11 refills | Status: DC

## 2022-05-18 NOTE — Assessment & Plan Note (Signed)
a1c today  Glucose readings at home ar high  Is at limit of current medicines Glipizide xl 10 mg daily  Metformin xr 100 mg bid   Adding lantus 10 u daily with glucose log f/u in 2 wk No insurance currently / GLP would not be option  Ref to endocrinology to help with management of DM2 Diet and exercise are fair/pt would like to improve further

## 2022-05-18 NOTE — Progress Notes (Signed)
Subjective:    Patient ID: Pam Patel, female    DOB: [birth year], 61 y.o.   MRN: [id number]  HPI Pt presents for f/u of DM2  Wt Readings from Last 3 Encounters:  05/18/22 187 lb 12.8 oz (85.2 kg)  12/19/21 194 lb (88 kg)  08/06/20 197 lb 3 oz (89.4 kg)  . 30.31 kg/m  Doing ok  [place]   Feeling fair overall  Legs feel heavy when she walks   DM2 Lab Results  Component Value Date   HGBA1C 11.9 (H) 12/19/2021   Sugar is running high 300s-400s   Glipizide xl 10 mg daily  Metformin xr 1000 mg bid   Watching diet  Doing the [employer] program with her husband Is also active  Exercise in the pool / leg work  Environmental education officer /yard work also   Family members have offered to pay for med     Taking ace and statin   Lab Results  Component Value Date   CHOL 248 (H) 12/19/2021   HDL 34.60 (L) 12/19/2021   LDLCALC 86 12/02/2019   LDLDIRECT 176.0 12/19/2021   TRIG (H) 12/19/2021    499.0 Triglyceride is over 400; calculations on Lipids are invalid.   CHOLHDL 7 12/19/2021   Crestor 10 mg daily -not on  Plans to re start it /pharmacy did not have it   bp is stable today  No cp or palpitations or headaches or edema  No side effects to medicines  BP Readings from Last 3 Encounters:  05/18/22 122/80  12/19/21 138/76  08/06/20 (!) 150/96     Hypothyroidism  Pt has no clinical changes No change in energy level/ hair or skin/ edema and no tremor Lab Results  Component Value Date   TSH 3.65 12/19/2021    No changes in goiter  Patient Active Problem List   Diagnosis Date Noted   Class 1 obesity due to excess calories with serious comorbidity and body mass index (BMI) of 31.0 to 31.9 in adult 08/08/2020   Hot flashes 08/28/2018   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   Dental disease 03/07/2018   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   Lung nodule 03/07/2018   Hypertension 03/06/2018   Panic disorder 03/06/2018   Thyromegaly  03/06/2018   DM (diabetes mellitus), type 2 ([place]) 03/06/2018   Hypothyroidism 10/05/2008   STRESS REACTION, ACUTE, WITH EMOTIONAL DISTURBANCE 10/05/2008   Hyperlipidemia associated with type 2 diabetes mellitus ([place]) 01/15/2008   Former smoker 01/15/2008   ALLERGIC RHINITIS, SEASONAL 01/15/2008   DEGENERATIVE DISC DISEASE, 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   Past Medical History:  Diagnosis Date   History of chickenpox    History of diabetes mellitus, type II    History of hypertension    Hypertension    Past Surgical History:  Procedure Laterality Date   APPENDECTOMY  1990   BACK SURGERY     Social History   Tobacco Use   Smoking status: Former   Smokeless tobacco: Never  Substance Use Topics   Alcohol use: Not Currently   Drug use: Never   Family History  Problem Relation Age of Onset   COPD Mother    Diabetes Mother    Heart attack Mother    Heart disease Mother    Hypertension Mother    Stroke Mother    Heart attack Father    Allergies  Allergen Reactions   Penicillins Hives    Has patient had  a PCN reaction causing immediate rash, facial/tongue/throat swelling, SOB or lightheadedness with hypotension: Unknown Has patient had a PCN reaction causing severe rash involving mucus membranes or skin necrosis: No Has patient had a PCN reaction that required hospitalization: No Has patient had a PCN reaction occurring within the last 10 years: No If all of the above answers are "NO", then may proceed with Cephalosporin use.    Naproxen     REACTION: hives   Current Outpatient Medications on File Prior to Visit  Medication Sig Dispense Refill   amLODipine (NORVASC) 5 MG tablet TAKE 1 TABLET (5 MG TOTAL) BY MOUTH DAILY. 90 tablet 1   aspirin EC 81 MG tablet Take 81 mg by mouth daily.      blood glucose meter kit and supplies KIT Dispense based on patient and insurance preference. Use up to four times daily as directed. (FOR ICD-9 250.00, 250.01). 1 each 0   glipiZIDE  (GLUCOTROL XL) 10 MG 24 hr tablet TAKE 1 TABLET (10 MG TOTAL) BY MOUTH DAILY WITH BREAKFAST. 90 tablet 2   levothyroxine (SYNTHROID) 75 MCG tablet Take 1 tablet (75 mcg total) by mouth daily before breakfast. 90 tablet 0   lisinopril (ZESTRIL) 40 MG tablet TAKE 1 TABLET BY MOUTH EVERY DAY 90 tablet 0   metFORMIN (GLUCOPHAGE-XR) 500 MG 24 hr tablet TAKE 2 PILLS BY MOUTH EVERY MORNING AND 1 PILL EVERY EVENING 270 tablet 2   metoprolol tartrate (LOPRESSOR) 50 MG tablet TAKE 1 TABLET BY MOUTH TWICE A DAY 180 tablet 1   PARoxetine (PAXIL) 20 MG tablet TAKE 1 TABLET BY MOUTH EVERY DAY 90 tablet 3   rosuvastatin (CRESTOR) 10 MG tablet Take 1 tablet (10 mg total) by mouth daily. 90 tablet 2   No current facility-administered medications on file prior to visit.     Review of Systems  Constitutional:  Positive for fatigue. Negative for activity change, appetite change, fever and unexpected weight change.  HENT:  Negative for congestion, ear pain, rhinorrhea, sinus pressure and sore throat.   Eyes:  Negative for pain, redness and visual disturbance.  Respiratory:  Negative for cough, shortness of breath and wheezing.   Cardiovascular:  Negative for chest pain and palpitations.  Gastrointestinal:  Negative for abdominal pain, blood in stool, constipation and diarrhea.  Endocrine: Negative for polydipsia and polyuria.  Genitourinary:  Negative for dysuria, frequency and urgency.  Musculoskeletal:  Negative for arthralgias, back pain and myalgias.       Legs feel heavy at times  Skin:  Negative for pallor and rash.  Allergic/Immunologic: Negative for environmental allergies.  Neurological:  Negative for dizziness, syncope and headaches.       Some tingling in feet   Hematological:  Negative for adenopathy. Does not bruise/bleed easily.  Psychiatric/Behavioral:  Negative for decreased concentration and dysphoric mood. The patient is not nervous/anxious.        Objective:   Physical  Exam Constitutional:      General: She is not in acute distress.    Appearance: Normal appearance. She is well-developed. She is obese. She is not ill-appearing or diaphoretic.  HENT:     Head: Normocephalic and atraumatic.  Eyes:     Conjunctiva/sclera: Conjunctivae normal.     Pupils: Pupils are equal, round, and reactive to light.  Neck:     Thyroid: No thyromegaly.     Vascular: No carotid bruit or JVD.  Cardiovascular:     Rate and Rhythm: Normal rate and regular rhythm.  Heart sounds: Normal heart sounds.     No gallop.  Pulmonary:     Effort: Pulmonary effort is normal. No respiratory distress.     Breath sounds: Normal breath sounds. No wheezing or rales.  Abdominal:     General: There is no distension or abdominal bruit.     Palpations: Abdomen is soft.  Musculoskeletal:     Cervical back: Normal range of motion and neck supple.     Right lower leg: No edema.     Left lower leg: No edema.  Lymphadenopathy:     Cervical: No cervical adenopathy.  Skin:    General: Skin is warm and dry.     Coloration: Skin is not pale.     Findings: No rash.  Neurological:     Mental Status: She is alert.     Coordination: Coordination normal.     Deep Tendon Reflexes: Reflexes are normal and symmetric. Reflexes normal.  Psychiatric:        Mood and Affect: Mood normal.           Assessment & Plan:   Problem List Items Addressed This Visit       Cardiovascular and Mediastinum   Hypertension    bp in fair control at this time  BP Readings from Last 1 Encounters:  05/18/22 122/80  No changes needed Most recent labs reviewed  Disc lifstyle change with low sodium diet and exercise  No changes  Plan to continue  Amlodipine 5 mg daily  Lisinopril 40 mg daily  Metoprolol 50 mg bid       Relevant Orders   Comprehensive metabolic panel (Completed)     Endocrine   Hyperlipidemia associated with type 2 diabetes mellitus ([place])    Disc goals for lipids and reasons to  control them Rev last labs with pt Rev low sat fat diet in detail Has not been taking crestor 10  Refilled this and she will start it      Relevant Medications   insulin glargine (LANTUS) 100 UNIT/ML injection   Other Relevant Orders   Comprehensive metabolic panel (Completed)   Hypothyroidism   Thyromegaly    No changes on exam  Last tsh was therapeutic       Type 2 diabetes mellitus with diabetic neuropathy, unspecified ([facility]) - Primary    a1c today  Glucose readings at home ar high  Is at limit of current medicines Glipizide xl 10 mg daily  Metformin xr 100 mg bid   Adding lantus 10 u daily with glucose log f/u in 2 wk No insurance currently / GLP would not be option  Ref to endocrinology to help with management of DM2 Diet and exercise are fair/pt would like to improve further       Relevant Medications   insulin glargine (LANTUS) 100 UNIT/ML injection

## 2022-05-18 NOTE — Assessment & Plan Note (Signed)
No changes on exam  Last tsh was therapeutic

## 2022-05-18 NOTE — Assessment & Plan Note (Signed)
bp in fair control at this time  BP Readings from Last 1 Encounters:  05/18/22 122/80   No changes needed Most recent labs reviewed  Disc lifstyle change with low sodium diet and exercise  No changes  Plan to continue  Amlodipine 5 mg daily  Lisinopril 40 mg daily  Metoprolol 50 mg bid

## 2022-05-18 NOTE — Patient Instructions (Addendum)
Make your eye exam appt for diabetic exam at [facility]  Have them send [place] the report  Continue current medicines Add lantus 10 units once daily  If glucose gets too low (under 80) hold it and let me know   In 2 weeks send me some glucose readings and we will titrate the lantus if needed   Start the crestor   I will work on a referral to endocrinology to help [place] with diabetes  If you don't get a call in 1-2 weeks let us know   Labs today

## 2022-05-18 NOTE — Assessment & Plan Note (Signed)
Disc goals for lipids and reasons to control them Rev last labs with pt Rev low sat fat diet in detail Has not been taking crestor 10  Refilled this and she will start it

## 2022-05-19 ENCOUNTER — Other Ambulatory Visit: Payer: Self-pay | Admitting: Family Medicine

## 2022-05-24 ENCOUNTER — Telehealth: Payer: Self-pay | Admitting: Family Medicine

## 2022-05-24 ENCOUNTER — Other Ambulatory Visit: Payer: Self-pay | Admitting: Family Medicine

## 2022-05-24 NOTE — Telephone Encounter (Signed)
Faxed noted to office

## 2022-05-24 NOTE — Telephone Encounter (Signed)
[name] from [facility] called in stating they received the referral for Pam Patel, but they are missing office notes and labs. They are needing this information in order to get the patient schedule. She stated you can fax the information over to [phone]. Thank you.

## 2022-06-01 ENCOUNTER — Telehealth: Payer: Self-pay | Admitting: Family Medicine

## 2022-06-01 MED ORDER — ROSUVASTATIN CALCIUM 10 MG PO TABS: 10.0000 mg | ORAL_TABLET | Freq: Every day | ORAL | 3 refills | Status: DC

## 2022-06-01 NOTE — Addendum Note (Signed)
Addended by: Eual Fines on: 06/01/2022 09:25 AM   Modules accepted: Orders

## 2022-06-01 NOTE — Telephone Encounter (Signed)
Hand written log placed in Dr Royden Purl inbox on her desk.

## 2022-06-01 NOTE — Telephone Encounter (Signed)
Patient came in office and dropped off diabetic numbers. The paperwork has been placed in PCP folder. Call back number [phone].

## 2022-06-01 NOTE — Telephone Encounter (Signed)
Rx failed the other day. I have sent it back in.

## 2022-06-02 ENCOUNTER — Other Ambulatory Visit: Payer: Self-pay

## 2022-06-02 MED ORDER — INSULIN GLARGINE 100 UNIT/ML ~~LOC~~ SOLN: 10.0000 [IU] | Freq: Every day | SUBCUTANEOUS | 11 refills | Status: DC

## 2022-06-02 NOTE — Telephone Encounter (Signed)
Called and spoke with pt she is taking 10 units daily pt is going to increase this 20 units sent in for 20 units to [facility]  pt said that she wasn't sure she had enough. Pt is going to check her b/s and call next week with numbers and if has not changed  pt will let us know. Advised pt we may have to go up on dose if this dose not change.pt understood.

## 2022-06-02 NOTE — Telephone Encounter (Signed)
Her glucose readings are quite high both fasting and pp  Please confirm that she is taking the lantus 10 u daily and inst her to increase it to 20 u daily (I can send new px if she runs out-please change on med list) If no low glucose readings in a week we will be able to go up more, please call or message me next week Continue the other medications

## 2022-06-30 ENCOUNTER — Telehealth: Payer: Self-pay | Admitting: Family Medicine

## 2022-06-30 ENCOUNTER — Other Ambulatory Visit: Payer: Self-pay

## 2022-06-30 DIAGNOSIS — E1169 Type 2 diabetes mellitus with other specified complication: Secondary | ICD-10-CM

## 2022-06-30 DIAGNOSIS — I10 Essential (primary) hypertension: Secondary | ICD-10-CM

## 2022-06-30 NOTE — Telephone Encounter (Signed)
-----   Message from Alvina Chou sent at 06/20/2022  3:27 PM EDT ----- Regarding: Lab orders for Friday, 8.25.23 Lab orders, lipid?? thanks

## 2022-07-17 ENCOUNTER — Other Ambulatory Visit: Payer: Self-pay | Admitting: Family Medicine

## 2022-08-15 ENCOUNTER — Telehealth: Payer: Self-pay | Admitting: Family Medicine

## 2022-08-15 MED ORDER — "INSULIN SYRINGE 28G X 1/2"" 0.5 ML MISC": 1 refills | Status: DC

## 2022-08-15 NOTE — Telephone Encounter (Signed)
Pt called stating she needed a prescription sent in for some needles for her insulin? Pt stated pharmacy stated they never received any request for the meds, rosuvastatin (CRESTOR) 10 MG tablet.   Caller Name: Pam Patel Call back phone #: [phone]  MEDICATION(S):  lisinopril (ZESTRIL) 40 MG tablet  Days of Med Remaining:   Has the patient contacted their pharmacy (YES/NO)? NO What did pharmacy advise?   Preferred Pharmacy:  [facility], [address]   ~~~Please advise patient/caregiver to allow 2-3 business days to process RX refills.

## 2022-08-15 NOTE — Telephone Encounter (Signed)
Checked with pharmacy Rxs were filled in July and ready, supplies sent in

## 2022-11-04 ENCOUNTER — Other Ambulatory Visit: Payer: Self-pay | Admitting: Family Medicine

## 2022-11-20 ENCOUNTER — Other Ambulatory Visit: Payer: Self-pay | Admitting: Family Medicine

## 2022-11-21 ENCOUNTER — Other Ambulatory Visit: Payer: Self-pay | Admitting: Family Medicine

## 2022-12-11 ENCOUNTER — Other Ambulatory Visit: Payer: Self-pay | Admitting: Family Medicine

## 2022-12-11 DIAGNOSIS — E039 Hypothyroidism, unspecified: Secondary | ICD-10-CM

## 2022-12-11 DIAGNOSIS — E1140 Type 2 diabetes mellitus with diabetic neuropathy, unspecified: Secondary | ICD-10-CM

## 2022-12-11 DIAGNOSIS — E010 Iodine-deficiency related diffuse (endemic) goiter: Secondary | ICD-10-CM

## 2022-12-12 NOTE — Telephone Encounter (Signed)
Looks like endo referral was placed in July 2023, last filled on 01/10/22 #270 tabs/ 1 refill, ? If PCP is still filling med or if she is seeing Endo, please advise

## 2022-12-12 NOTE — Telephone Encounter (Signed)
I put the referral in  Did see prior referral    ? What happened   Alert [place] if she does not hear in 1-2 wk This was for both DM2 and hypothyroidism

## 2022-12-12 NOTE — Telephone Encounter (Signed)
Pt advised.

## 2022-12-12 NOTE — Telephone Encounter (Signed)
Pt said she doesn't need metformin right now it may be a auto refill because she has 3 months worth of meds still. Pt said she forgot about the referral because she never heard anything or received a phone call regarding referral. Pt is still okay to be referred to endo if PCP thinks she needs to see them still.

## 2022-12-12 NOTE — Telephone Encounter (Signed)
Please ask her what happened with that

## 2022-12-12 NOTE — Telephone Encounter (Signed)
I put the referral in I did review referral last time and it looked like it went through  I referred her for both DM and thyroid

## 2022-12-13 ENCOUNTER — Encounter: Payer: Self-pay | Admitting: *Deleted

## 2023-03-19 ENCOUNTER — Other Ambulatory Visit: Payer: Self-pay | Admitting: Family Medicine

## 2023-03-19 NOTE — Telephone Encounter (Signed)
Please call her and ask what the deal is with that   Do not refill until we hear from her  Thanks

## 2023-03-19 NOTE — Telephone Encounter (Signed)
Per referral notes they called pt x3 to try and get endo scheduled in Feb, pt never called back so referral was closed. They even mailed a letter and also sent a mychart. Please advise (referral was for DM and Thyroid)

## 2023-03-21 NOTE — Telephone Encounter (Signed)
Pt said she decided she doesn't want to see an endo doc so she never call them back. Pt schedule f/u with PCP on Monday 03/26/23 to discuss this and discuss her DM and Thyroid with PCP

## 2023-03-26 ENCOUNTER — Ambulatory Visit (INDEPENDENT_AMBULATORY_CARE_PROVIDER_SITE_OTHER): Payer: Self-pay | Admitting: Family Medicine

## 2023-03-26 ENCOUNTER — Encounter: Payer: Self-pay | Admitting: Family Medicine

## 2023-03-26 ENCOUNTER — Telehealth: Payer: Self-pay

## 2023-03-26 VITALS — BP 128/80 | HR 67 | Temp 97.5°F | Ht 66.0 in | Wt 191.5 lb

## 2023-03-26 DIAGNOSIS — I10 Essential (primary) hypertension: Secondary | ICD-10-CM

## 2023-03-26 DIAGNOSIS — E1140 Type 2 diabetes mellitus with diabetic neuropathy, unspecified: Secondary | ICD-10-CM

## 2023-03-26 DIAGNOSIS — E039 Hypothyroidism, unspecified: Secondary | ICD-10-CM

## 2023-03-26 DIAGNOSIS — E1169 Type 2 diabetes mellitus with other specified complication: Secondary | ICD-10-CM

## 2023-03-26 DIAGNOSIS — Z7984 Long term (current) use of oral hypoglycemic drugs: Secondary | ICD-10-CM

## 2023-03-26 DIAGNOSIS — Z91199 Patient's noncompliance with other medical treatment and regimen due to unspecified reason: Secondary | ICD-10-CM

## 2023-03-26 DIAGNOSIS — I70219 Atherosclerosis of native arteries of extremities with intermittent claudication, unspecified extremity: Secondary | ICD-10-CM | POA: Insufficient documentation

## 2023-03-26 DIAGNOSIS — E6609 Other obesity due to excess calories: Secondary | ICD-10-CM

## 2023-03-26 DIAGNOSIS — R232 Flushing: Secondary | ICD-10-CM

## 2023-03-26 DIAGNOSIS — Z6831 Body mass index (BMI) 31.0-31.9, adult: Secondary | ICD-10-CM

## 2023-03-26 DIAGNOSIS — I739 Peripheral vascular disease, unspecified: Secondary | ICD-10-CM

## 2023-03-26 DIAGNOSIS — E785 Hyperlipidemia, unspecified: Secondary | ICD-10-CM

## 2023-03-26 LAB — COMPREHENSIVE METABOLIC PANEL
ALT: 31 U/L (ref 0–35)
AST: 37 U/L (ref 0–37)
Albumin: 3.8 g/dL (ref 3.5–5.2)
Alkaline Phosphatase: 96 U/L (ref 39–117)
BUN: 12 mg/dL (ref 6–23)
CO2: 25 mEq/L (ref 19–32)
Calcium: 10 mg/dL (ref 8.4–10.5)
Chloride: 101 mEq/L (ref 96–112)
Creatinine, Ser: 0.76 mg/dL (ref 0.40–1.20)
GFR: 83.48 mL/min (ref >60.00)
Glucose, Bld: 312 mg/dL — ABNORMAL HIGH (ref 70–99)
Potassium: 4.5 mEq/L (ref 3.5–5.1)
Sodium: 133 mEq/L — ABNORMAL LOW (ref 135–145)
Total Bilirubin: 0.6 mg/dL (ref 0.2–1.2)
Total Protein: 6.7 g/dL (ref 6.0–8.3)

## 2023-03-26 LAB — LIPID PANEL
Cholesterol: 237 mg/dL — ABNORMAL HIGH (ref 0–200)
HDL: 32.4 mg/dL — ABNORMAL LOW (ref >39.00)
NonHDL: 204.27
Total CHOL/HDL Ratio: 7
Triglycerides: 281 mg/dL — ABNORMAL HIGH (ref 0.0–149.0)
VLDL: 56.2 mg/dL — ABNORMAL HIGH (ref 0.0–40.0)

## 2023-03-26 LAB — CBC WITH DIFFERENTIAL/PLATELET
Basophils Absolute: 0.1 10*3/uL (ref 0.0–0.1)
Basophils Relative: 1.1 % (ref 0.0–3.0)
Eosinophils Absolute: 0.1 10*3/uL (ref 0.0–0.7)
Eosinophils Relative: 1.4 % (ref 0.0–5.0)
HCT: 43.8 % (ref 36.0–46.0)
Hemoglobin: 15.1 g/dL — ABNORMAL HIGH (ref 12.0–15.0)
Lymphocytes Relative: 30.1 % (ref 12.0–46.0)
Lymphs Abs: 2.7 10*3/uL (ref 0.7–4.0)
MCHC: 34.4 g/dL (ref 30.0–36.0)
MCV: 86.8 fl (ref 78.0–100.0)
Monocytes Absolute: 0.4 10*3/uL (ref 0.1–1.0)
Monocytes Relative: 4.3 % (ref 3.0–12.0)
Neutro Abs: 5.6 10*3/uL (ref 1.4–7.7)
Neutrophils Relative %: 63.1 % (ref 43.0–77.0)
Platelets: 287 10*3/uL (ref 150.0–400.0)
RBC: 5.05 Mil/uL (ref 3.87–5.11)
RDW: 13 % (ref 11.5–15.5)
WBC: 8.9 10*3/uL (ref 4.0–10.5)

## 2023-03-26 LAB — HEMOGLOBIN A1C: Hgb A1c MFr Bld: 13.3 % — ABNORMAL HIGH (ref 4.6–6.5)

## 2023-03-26 LAB — LDL CHOLESTEROL, DIRECT: Direct LDL: 165 mg/dL

## 2023-03-26 MED ORDER — "INSULIN SYRINGE 28G X 1/2"" 0.5 ML MISC": 1 refills | Status: DC

## 2023-03-26 NOTE — Assessment & Plan Note (Signed)
A1c today  Expect high  Difficulty with compliance  Metformin xr 1000 mg bid Glipizide xl 10 mg daily - does miss doses Never really started lantis  Noncompliant with this/ and glucose monitoring  Discussed use of alarms/cell reminders for medicaitons Will schedule nurse visit to help with inj of lantus  Pt declines endo ref since she cannot afford w/o insurance  Per pt diet is fair  Taking statin and ace

## 2023-03-26 NOTE — Patient Instructions (Addendum)
Start checking glucose levels am fasting and 2 hours after meal (different times)  Get back on medication   Use both reminder function on cell phone and also set alarms on cell phone to remember to take your medicine   I am a little worried about the calf pain (? Vascular problem) The test I usually order is ABI  (ankle/ brachial index ) - let me know if you are interested   Crestor can cause leg pain also -need to be mindful of this     Labs today  Then we will make a plan   Keep watching diet for both sugar and fat Avoid red meat/ fried foods/ egg yolks/ fatty breakfast meats/ butter, cheese and high fat dairy/ and shellfish  Try to get most of your carbohydrates from produce (with the exception of white potatoes)  Eat less bread/pasta/rice/snack foods/cereals/sweets and other items from the middle of the grocery store (processed carbs)   Keep looking for affordable insurance  ? If you may qualify for medicaid   Keep thinking of ways to prioritize your health and self care

## 2023-03-26 NOTE — Assessment & Plan Note (Signed)
bp in fair control at this time  BP Readings from Last 1 Encounters:  03/26/23 128/80   No changes needed Most recent labs reviewed  Disc lifstyle change with low sodium diet and exercise  No changes  Plan to continue  Amlodipine 5 mg daily  Lisinopril 40 mg daily  Metoprolol 50 mg bid

## 2023-03-26 NOTE — Assessment & Plan Note (Signed)
Paxil helping much

## 2023-03-26 NOTE — Assessment & Plan Note (Signed)
TSH today  Taking levothyroxine 75 mcg  ? Consistently  Feels fair  Continues to have goiter

## 2023-03-26 NOTE — Assessment & Plan Note (Signed)
With medications Also no insurance   Discussed ways to trouble shoot this Unsure if health and self care are high on priority list

## 2023-03-26 NOTE — Progress Notes (Signed)
Subjective:    Patient ID: Pam Patel, female    DOB: 1961/06/01, 62 y.o.   MRN: [id number]  HPI Pt presents for f/u of DM2 and hypothyroidism and chronic health problems incl HTN  Wt Readings from Last 3 Encounters:  03/26/23 191 lb 8 oz (86.9 kg)  05/18/22 187 lb 12.8 oz (85.2 kg)  12/19/21 194 lb (88 kg)   30.91 kg/m Vitals:   03/26/23 1153  BP: 128/80  Pulse: 67  Temp: (!) 97.5 F (36.4 C)  SpO2: 98%   Feeling ok overall  A little tired   Self care is fair /could be better   Forgets to take her medicine  Struggles with this   Also has a hard time eating breakfast in the am    HTN bp is stable today  No cp or palpitations or headaches or edema  No side effects to medicines  BP Readings from Last 3 Encounters:  03/26/23 128/80  05/18/22 122/80  12/19/21 138/76    Pulse Readings from Last 3 Encounters:  03/26/23 67  05/18/22 96  12/19/21 76   Amlodipine 5 mg dialy  Lisinopril 40 mg daily  Metoprolol 50 mg bid   DM2 Lab Results  Component Value Date   HGBA1C 13.0 (H) 05/18/2022   Glipizide xl 10 mg daily -puts it off because she does not always eat breakfast0   Metformin xr 100 mg bid    Lantus added 10 and then increase to 20 (she forgets or thinks of too late)  Lack of insurance ruled out much medication   We worked on referral to endocrinology  She did not go due to her motivation problem   Is eating better overall  No sweets No drinks with sugar  Eating more aparagus/ green veggies     Hypothyroid Lab Results  Component Value Date   TSH 3.65 12/19/2021  Levothyroxine 75 mcg   Hyperlipidemia Lab Results  Component Value Date   CHOL 248 (H) 12/19/2021   HDL 34.60 (L) 12/19/2021   LDLCALC 86 12/02/2019   LDLDIRECT 176.0 12/19/2021   TRIG (H) 12/19/2021    499.0 Triglyceride is over 400; calculations on Lipids are invalid.   CHOLHDL 7 12/19/2021   At time of draw was not taking her crestor 10 mg  ? 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   Lung nodule 03/07/2018   Hypertension 03/06/2018   Panic disorder 03/06/2018   Thyromegaly 03/06/2018   Type 2 diabetes mellitus with diabetic neuropathy, unspecified (HCC) 03/06/2018   Hypothyroidism 10/05/2008   STRESS REACTION, ACUTE, WITH EMOTIONAL DISTURBANCE 10/05/2008   Hyperlipidemia associated with type 2 diabetes mellitus (HCC) 01/15/2008   Former smoker 01/15/2008   ALLERGIC RHINITIS, SEASONAL 01/15/2008   DEGENERATIVE DISC DISEASE, 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   Past Medical History:  Diagnosis Date   History of chickenpox  History of diabetes mellitus, type II    History of hypertension    Hypertension    Past Surgical History:  Procedure Laterality Date   APPENDECTOMY  1990   BACK SURGERY     Social History   Tobacco Use   Smoking status: Former   Smokeless tobacco: Never  Substance Use Topics   Alcohol use: Not Currently   Drug use: Never   Family History  Problem Relation Age of Onset   COPD Mother    Diabetes Mother    Heart attack Mother    Heart disease Mother    Hypertension Mother    Stroke Mother    Heart attack Father    Allergies  Allergen Reactions   Penicillins Hives    Has patient had a PCN reaction causing immediate rash, facial/tongue/throat swelling, SOB or lightheadedness with hypotension: Unknown Has patient had a PCN reaction causing severe rash involving mucus membranes  or skin necrosis: No Has patient had a PCN reaction that required hospitalization: No Has patient had a PCN reaction occurring within the last 10 years: No If all of the above answers are "NO", then may proceed with Cephalosporin use.    Naproxen     REACTION: hives   Current Outpatient Medications on File Prior to Visit  Medication Sig Dispense Refill   amLODipine (NORVASC) 5 MG tablet TAKE 1 TABLET (5 MG TOTAL) BY MOUTH DAILY. 90 tablet 1   aspirin EC 81 MG tablet Take 81 mg by mouth daily.      blood glucose meter kit and supplies KIT Dispense based on patient and insurance preference. Use up to four times daily as directed. (FOR ICD-9 250.00, 250.01). 1 each 0   glipiZIDE (GLUCOTROL XL) 10 MG 24 hr tablet TAKE 1 TABLET (10 MG TOTAL) BY MOUTH DAILY WITH BREAKFAST. 30 tablet 0   insulin glargine (LANTUS) 100 UNIT/ML injection Inject 0.1 mLs (10 Units total) into the skin daily. 20 mL 11   levothyroxine (SYNTHROID) 75 MCG tablet TAKE 1 TABLET BY MOUTH EVERY DAY BEFORE BREAKFAST 30 tablet 0   lisinopril (ZESTRIL) 40 MG tablet TAKE 1 TABLET BY MOUTH EVERY DAY 90 tablet 1   metFORMIN (GLUCOPHAGE-XR) 500 MG 24 hr tablet TAKE 2 PILLS BY MOUTH EVERY MORNING AND 1 PILL EVERY EVENING 270 tablet 2   metoprolol tartrate (LOPRESSOR) 50 MG tablet TAKE 1 TABLET BY MOUTH TWICE A DAY 180 tablet 1   PARoxetine (PAXIL) 20 MG tablet TAKE 1 TABLET BY MOUTH EVERY DAY 90 tablet 3   rosuvastatin (CRESTOR) 10 MG tablet Take 1 tablet (10 mg total) by mouth daily. 90 tablet 3   No current facility-administered medications on file prior to visit.      Review of Systems  Constitutional:  Positive for fatigue. Negative for activity change, appetite change, fever and unexpected weight change.  HENT:  Negative for congestion, ear pain, rhinorrhea, sinus pressure and sore throat.   Eyes:  Negative for pain, redness and visual disturbance.  Respiratory:  Negative for cough, shortness of breath and wheezing.    Cardiovascular:  Negative for chest pain and palpitations.  Gastrointestinal:  Negative for abdominal pain, blood in stool, constipation and diarrhea.  Endocrine: Negative for polydipsia and polyuria.  Genitourinary:  Negative for dysuria, frequency and urgency.  Musculoskeletal:  Positive for arthralgias and myalgias. Negative for back pain.  Skin:  Negative for pallor and rash.  Allergic/Immunologic: Negative for environmental allergies.  Neurological:  Negative for dizziness, syncope and headaches.  Hematological:  Negative for adenopathy. Does not bruise/bleed easily.  Psychiatric/Behavioral:  Negative for decreased concentration and dysphoric mood. The patient is not nervous/anxious.        Objective:   Physical Exam Constitutional:      General: She is not in acute distress.    Appearance: Normal appearance. She is well-developed. She is obese. She is not ill-appearing or diaphoretic.  HENT:     Head: Normocephalic and atraumatic.     Right Ear: Tympanic membrane and ear canal normal.     Left Ear: Tympanic membrane and ear canal normal.  Eyes:     General: No scleral icterus.    Conjunctiva/sclera: Conjunctivae normal.     Pupils: Pupils are equal, round, and reactive to light.  Neck:     Thyroid: No thyromegaly.     Vascular: No carotid bruit or JVD.  Cardiovascular:     Rate and Rhythm: Normal rate and regular rhythm.     Heart sounds: Normal heart sounds.     No gallop.     Comments: Pedal pulses are palpable but not strong  Pulmonary:     Effort: Pulmonary effort is normal. No respiratory distress.     Breath sounds: Normal breath sounds. No wheezing or rales.  Abdominal:     General: There is no distension or abdominal bruit.     Palpations: Abdomen is soft.  Musculoskeletal:     Cervical back: Normal range of motion and neck supple.     Right lower leg: No edema.     Left lower leg: No edema.  Lymphadenopathy:     Cervical: No cervical adenopathy.  Skin:     General: Skin is warm and dry.     Coloration: Skin is not pale.     Findings: No rash.  Neurological:     Mental Status: She is alert.     Coordination: Coordination normal.     Deep Tendon Reflexes: Reflexes are normal and symmetric. Reflexes normal.  Psychiatric:        Mood and Affect: Mood normal.           Assessment & Plan:   Problem List Items Addressed This Visit       Cardiovascular and Mediastinum   Hot flashes    Paxil helping much      Hypertension    bp in fair control at this time  BP Readings from Last 1 Encounters:  03/26/23 128/80  No changes needed Most recent labs reviewed  Disc lifstyle change with low sodium diet and exercise  No changes  Plan to continue  Amlodipine 5 mg daily  Lisinopril 40 mg daily  Metoprolol 50 mg bid       Relevant Orders   TSH   Lipid panel   Comprehensive metabolic panel   CBC with Differential/Platelet     Endocrine   Hyperlipidemia associated with type 2 diabetes mellitus (HCC)    Lab today  Disc goals for lipids and reasons to control them Rev last labs with pt Rev low sat fat diet in detail Crestor may be causing leg pain/ unsure  Does miss doses       Relevant Orders   Lipid panel   Comprehensive metabolic panel   Hypothyroidism    TSH today  Taking levothyroxine 75 mcg  ? Consistently  Feels fair  Continues to have goiter       Relevant Orders   TSH   Type 2 diabetes mellitus with diabetic  neuropathy, unspecified (HCC) - Primary    A1c today  Expect high  Difficulty with compliance  Metformin xr 1000 mg bid Glipizide xl 10 mg daily - does miss doses Never really started lantis  Noncompliant with this/ and glucose monitoring  Discussed use of alarms/cell reminders for medicaitons Will schedule nurse visit to help with inj of lantus  Pt declines endo ref since she cannot afford w/o insurance  Per pt diet is fair  Taking statin and ace       Relevant Orders   Hemoglobin A1c      Other   Class 1 obesity due to excess calories with serious comorbidity and body mass index (BMI) of 31.0 to 31.9 in adult    Discussed how this problem influences overall health and the risks it imposes  Reviewed plan for weight loss with lower calorie diet (via better food choices and also portion control or program like weight watchers) and exercise building up to or more than 30 minutes 5 days per week including some aerobic activity        Claudication of calf muscles (HCC)    Usnure if this could be vascular vs side effects of crestor Per pt=going on much of her life  Encouraged trial of pedaling for exercise   Would like to get her ABIs but unsure if she could afford Will plan to discuss further at f/u  Pulses are palpable but not strong        Poor compliance    With medications Also no insurance   Discussed ways to trouble shoot this Unsure if health and self care are high on priority list

## 2023-03-26 NOTE — Telephone Encounter (Signed)
I spoke with Pam Patel at [facility] [address] [facility] to assist pt on giving insulin to herself; there is no need for appt and there is no charge. Pt can come to [facility] and ask to speak with pharmacist about general consultation and the pharmacist will assist pt.  I spoke with pt since she does not have insurance and pt was appreciative and voiced understanding of what she needs to do. Pt said she is mainly wanting to make sure she is giving the lantus with correct dosage. I advised pt if she had any questions to give [place] a call and pt was appreciative and pt asked to cancel the 03/30/23 nurse visit since she does not have ins. And pt will go to [facility] for training on how to administer lantus to herself with the correct dosage. Sending FYI to Dr Milinda Antis who has already said this would be OK.

## 2023-03-26 NOTE — Assessment & Plan Note (Signed)
Usnure if this could be vascular vs side effects of crestor Per pt=going on much of her life  Encouraged trial of pedaling for exercise   Would like to get her ABIs but unsure if she could afford Will plan to discuss further at f/u  Pulses are palpable but not strong

## 2023-03-26 NOTE — Telephone Encounter (Signed)
That is fine. Thanks 

## 2023-03-26 NOTE — Assessment & Plan Note (Signed)
Lab today  Disc goals for lipids and reasons to control them Rev last labs with pt Rev low sat fat diet in detail Crestor may be causing leg pain/ unsure  Does miss doses

## 2023-03-26 NOTE — Assessment & Plan Note (Signed)
Discussed how this problem influences overall health and the risks it imposes  Reviewed plan for weight loss with lower calorie diet (via better food choices and also portion control or program like weight watchers) and exercise building up to or more than 30 minutes 5 days per week including some aerobic activity    

## 2023-03-27 ENCOUNTER — Telehealth: Payer: Self-pay | Admitting: *Deleted

## 2023-03-27 LAB — TSH: TSH: 2.86 u[IU]/mL (ref 0.35–5.50)

## 2023-03-27 NOTE — Telephone Encounter (Signed)
Called pt and no answer and pt's VM box isn't set up so no message left

## 2023-03-27 NOTE — Telephone Encounter (Signed)
-----   Message from Judy Pimple, MD sent at 03/27/2023 10:16 AM EDT ----- Diabetes is in very poor control as expected with A1c of 13.3  Start on the insulin as soon as she can  Follow up in 2-3 weeks with her blood sugar log (check at least twice daily) and write down what she eats as well  We will likely advance the insulin dose fairly quickly over time Cholesterol is quite high so get back on rosuvastatin  Sodium level is low-this may be due to high sugar levels  Thyroid number is ok

## 2023-03-29 ENCOUNTER — Ambulatory Visit: Payer: Self-pay

## 2023-04-04 ENCOUNTER — Other Ambulatory Visit: Payer: Self-pay | Admitting: Family Medicine

## 2023-04-04 NOTE — Telephone Encounter (Signed)
Addressed through result notes  

## 2023-04-17 ENCOUNTER — Ambulatory Visit: Payer: Self-pay | Admitting: Family Medicine

## 2023-05-01 ENCOUNTER — Ambulatory Visit: Payer: Self-pay | Admitting: Family Medicine

## 2023-05-04 ENCOUNTER — Other Ambulatory Visit: Payer: Self-pay | Admitting: Family Medicine

## 2023-05-04 NOTE — Telephone Encounter (Signed)
See prev message. Please schedule f/u and then route back to me to refill once the appt has been made

## 2023-05-04 NOTE — Telephone Encounter (Signed)
Pt had f/u on 03/26/23, pt was suppose to f/u to discuss DM and cancelled both appts in June that were scheduled. Please advise

## 2023-05-04 NOTE — Telephone Encounter (Signed)
Please re schedule appointments  Then refill once if the appt is on the books

## 2023-05-04 NOTE — Telephone Encounter (Signed)
Spoke to pt, rescheduled f/u for 7/2

## 2023-05-08 ENCOUNTER — Ambulatory Visit (INDEPENDENT_AMBULATORY_CARE_PROVIDER_SITE_OTHER): Payer: Self-pay | Admitting: Family Medicine

## 2023-05-08 ENCOUNTER — Encounter: Payer: Self-pay | Admitting: Family Medicine

## 2023-05-08 VITALS — BP 128/78 | HR 78 | Temp 97.8°F | Ht 66.0 in | Wt 189.8 lb

## 2023-05-08 DIAGNOSIS — E039 Hypothyroidism, unspecified: Secondary | ICD-10-CM

## 2023-05-08 DIAGNOSIS — Z5989 Other problems related to housing and economic circumstances: Secondary | ICD-10-CM

## 2023-05-08 DIAGNOSIS — I739 Peripheral vascular disease, unspecified: Secondary | ICD-10-CM

## 2023-05-08 DIAGNOSIS — Z7984 Long term (current) use of oral hypoglycemic drugs: Secondary | ICD-10-CM

## 2023-05-08 DIAGNOSIS — E1140 Type 2 diabetes mellitus with diabetic neuropathy, unspecified: Secondary | ICD-10-CM

## 2023-05-08 DIAGNOSIS — I10 Essential (primary) hypertension: Secondary | ICD-10-CM

## 2023-05-08 DIAGNOSIS — E785 Hyperlipidemia, unspecified: Secondary | ICD-10-CM

## 2023-05-08 DIAGNOSIS — E1142 Type 2 diabetes mellitus with diabetic polyneuropathy: Secondary | ICD-10-CM

## 2023-05-08 DIAGNOSIS — E1169 Type 2 diabetes mellitus with other specified complication: Secondary | ICD-10-CM

## 2023-05-08 LAB — BASIC METABOLIC PANEL
BUN: 13 mg/dL (ref 6–23)
CO2: 25 mEq/L (ref 19–32)
Calcium: 10.9 mg/dL — ABNORMAL HIGH (ref 8.4–10.5)
Chloride: 99 mEq/L (ref 96–112)
Creatinine, Ser: 0.78 mg/dL (ref 0.40–1.20)
GFR: 80.85 mL/min (ref >60.00)
Glucose, Bld: 330 mg/dL — ABNORMAL HIGH (ref 70–99)
Potassium: 4.5 mEq/L (ref 3.5–5.1)
Sodium: 132 mEq/L — ABNORMAL LOW (ref 135–145)

## 2023-05-08 LAB — LIPID PANEL
Cholesterol: 136 mg/dL (ref 0–200)
HDL: 32 mg/dL — ABNORMAL LOW (ref >39.00)
NonHDL: 103.73
Total CHOL/HDL Ratio: 4
Triglycerides: 206 mg/dL — ABNORMAL HIGH (ref 0.0–149.0)
VLDL: 41.2 mg/dL — ABNORMAL HIGH (ref 0.0–40.0)

## 2023-05-08 LAB — LDL CHOLESTEROL, DIRECT: Direct LDL: 72 mg/dL

## 2023-05-08 MED ORDER — GLIPIZIDE ER 10 MG PO TB24: 10.0000 mg | ORAL_TABLET | Freq: Every day | ORAL | 1 refills | Status: DC

## 2023-05-08 MED ORDER — LEVOTHYROXINE SODIUM 75 MCG PO TABS: ORAL_TABLET | ORAL | 1 refills | Status: DC

## 2023-05-08 MED ORDER — LISINOPRIL 40 MG PO TABS: 40.0000 mg | ORAL_TABLET | Freq: Every day | ORAL | 1 refills | Status: DC

## 2023-05-08 NOTE — Assessment & Plan Note (Signed)
ABI s ordered  Some calf pain /claudication  Unable to feel clear pedal pulses

## 2023-05-08 NOTE — Progress Notes (Signed)
Subjective:    Patient ID: Pam Patel, female    DOB: 1961-04-08, 62 y.o.   MRN: [id number]  HPI  Wt Readings from Last 3 Encounters:  05/08/23 189 lb 12.8 oz (86.1 kg)  03/26/23 191 lb 8 oz (86.9 kg)  05/18/22 187 lb 12.8 oz (85.2 kg)   30.63 kg/m  Vitals:   05/08/23 1059  BP: 128/78  Pulse: 78  Temp: 97.8 F (36.6 C)  SpO2: 98%    Pt presents for follow up of DM2 and chronic medical problems   Much better medicine compliance  A lot going on  Forgot to write her numbers down    Lab Results  Component Value Date   HGBA1C 13.3 (H) 03/26/2023   This was up  Discussed issues with medicine compliance and affordability last time  Metformin xr 1000 mg bid  Glipizide xl 10 mg daily   Lantus 10 u daily  Able to inject with no problems  (she did consult with pharmacist)   Not checking blood sugars   Has some symptoms of diabetic neuropathy Using a topical product      On statin and ace  Lab Results  Component Value Date   NA 133 (L) 03/26/2023   K 4.5 03/26/2023   CO2 25 03/26/2023   GLUCOSE 312 (H) 03/26/2023   BUN 12 03/26/2023   CREATININE 0.76 03/26/2023   CALCIUM 10.0 03/26/2023   GFR 83.48 03/26/2023   GFRNONAA >60 02/22/2018   Lab Results  Component Value Date   CHOL 237 (H) 03/26/2023   HDL 32.40 (L) 03/26/2023   LDLCALC 86 12/02/2019   LDLDIRECT 165.0 03/26/2023   TRIG 281.0 (H) 03/26/2023   CHOLHDL 7 03/26/2023  Had been of rosuvastatin    Lab Results  Component Value Date   TSH 2.86 03/26/2023  Levothyroxine 75 mcg daily      Patient Active Problem List   Diagnosis Date Noted   Does not have health insurance 05/08/2023   Claudication of calf muscles (HCC) 03/26/2023   Poor compliance 03/26/2023   Class 1 obesity due to excess calories with serious comorbidity and body mass index (BMI) of 31.0 to 31.9 in adult 08/08/2020   Hot flashes 08/28/2018   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   Dental disease 03/07/2018   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   Lung nodule 03/07/2018   Hypertension 03/06/2018   Panic disorder 03/06/2018   Thyromegaly 03/06/2018   DM type 2 with diabetic peripheral neuropathy (HCC) 03/06/2018   Hypothyroidism 10/05/2008   STRESS REACTION, ACUTE, WITH EMOTIONAL DISTURBANCE 10/05/2008   Hyperlipidemia associated with type 2 diabetes mellitus (HCC) 01/15/2008   Former smoker 01/15/2008   ALLERGIC RHINITIS, SEASONAL 01/15/2008   DEGENERATIVE DISC DISEASE, 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   Past Medical History:  Diagnosis Date   History of chickenpox    History of diabetes mellitus, type II    History of hypertension    Hypertension    Past Surgical History:  Procedure Laterality Date   APPENDECTOMY  1990   BACK SURGERY     Social History   Tobacco Use   Smoking status: Former   Smokeless tobacco: Never  Substance Use Topics   Alcohol use: Not Currently   Drug use: Never   Family History  Problem Relation Age of Onset   COPD Mother    Diabetes Mother    Heart attack Mother    Heart disease Mother    Hypertension Mother    Stroke Mother  Heart attack Father    Allergies  Allergen Reactions   Penicillins Hives    Has patient had a PCN reaction causing immediate rash, facial/tongue/throat swelling, SOB or lightheadedness with hypotension: Unknown Has patient had a PCN reaction causing severe rash involving mucus membranes or skin necrosis: No Has patient had a PCN reaction that required hospitalization: No Has patient had a PCN reaction occurring within the last 10 years: No If all of the above answers are "NO", then may proceed with Cephalosporin use.    Naproxen     REACTION: hives   Current Outpatient Medications on File Prior to Visit  Medication Sig Dispense Refill   amLODipine (NORVASC) 5 MG tablet TAKE 1 TABLET (5 MG TOTAL) BY MOUTH DAILY. 90 tablet 1   aspirin EC 81 MG tablet Take 81 mg by mouth daily.      blood glucose meter kit and supplies KIT Dispense based  on patient and insurance preference. Use up to four times daily as directed. (FOR ICD-9 250.00, 250.01). 1 each 0   insulin glargine (LANTUS) 100 UNIT/ML injection Inject 0.1 mLs (10 Units total) into the skin daily. 20 mL 11   Insulin Syringe-Needle U-100 (INSULIN SYRINGE .5CC/28G) 28G X 1/2" 0.5 ML MISC Use with insulin once daily 100 each 1   metFORMIN (GLUCOPHAGE-XR) 500 MG 24 hr tablet TAKE 2 PILLS BY MOUTH EVERY MORNING AND 1 PILL EVERY EVENING 270 tablet 0   metoprolol tartrate (LOPRESSOR) 50 MG tablet TAKE 1 TABLET BY MOUTH TWICE A DAY 180 tablet 1   PARoxetine (PAXIL) 20 MG tablet TAKE 1 TABLET BY MOUTH EVERY DAY 90 tablet 0   rosuvastatin (CRESTOR) 10 MG tablet Take 1 tablet (10 mg total) by mouth daily. 90 tablet 3   No current facility-administered medications on file prior to visit.    Review of Systems  Constitutional:  Negative for activity change, appetite change, fatigue, fever and unexpected weight change.  HENT:  Negative for congestion, ear pain, rhinorrhea, sinus pressure and sore throat.   Eyes:  Negative for pain, redness and visual disturbance.  Respiratory:  Negative for cough, shortness of breath and wheezing.   Cardiovascular:  Negative for chest pain and palpitations.       Calf pain when walking  Not unusual for her   Gastrointestinal:  Negative for abdominal pain, blood in stool, constipation and diarrhea.  Endocrine: Negative for polydipsia and polyuria.  Genitourinary:  Negative for dysuria, frequency and urgency.  Musculoskeletal:  Negative for arthralgias, back pain and myalgias.  Skin:  Negative for pallor and rash.  Allergic/Immunologic: Negative for environmental allergies.  Neurological:  Negative for dizziness, syncope and headaches.       Burning /stinging in feet  Hematological:  Negative for adenopathy. Does not bruise/bleed easily.  Psychiatric/Behavioral:  Negative for decreased concentration and dysphoric mood. The patient is not  nervous/anxious.        Objective:   Physical Exam Constitutional:      General: She is not in acute distress.    Appearance: Normal appearance. She is well-developed. She is obese. She is not ill-appearing or diaphoretic.  HENT:     Head: Normocephalic and atraumatic.  Eyes:     Conjunctiva/sclera: Conjunctivae normal.     Pupils: Pupils are equal, round, and reactive to light.  Neck:     Thyroid: No thyromegaly.     Vascular: No carotid bruit or JVD.  Cardiovascular:     Rate and Rhythm: Normal rate and regular  rhythm.     Heart sounds: Normal heart sounds.     No gallop.     Comments: Pedal pulses are difficult to palpate  Pulmonary:     Effort: Pulmonary effort is normal. No respiratory distress.     Breath sounds: Normal breath sounds. No wheezing or rales.  Abdominal:     General: There is no distension or abdominal bruit.     Palpations: Abdomen is soft.  Musculoskeletal:     Cervical back: Normal range of motion and neck supple.     Right lower leg: No edema.     Left lower leg: No edema.  Lymphadenopathy:     Cervical: No cervical adenopathy.  Skin:    General: Skin is warm and dry.     Coloration: Skin is not pale.     Findings: No rash.  Neurological:     Mental Status: She is alert.     Coordination: Coordination normal.     Deep Tendon Reflexes: Reflexes are normal and symmetric. Reflexes normal.  Psychiatric:        Mood and Affect: Mood normal.           Assessment & Plan:   Problem List Items Addressed This Visit       Cardiovascular and Mediastinum   Hypertension    bp in fair control at this time  BP Readings from Last 1 Encounters:  05/08/23 128/78  No changes needed Most recent labs reviewed  Disc lifstyle change with low sodium diet and exercise  No changes  Plan to continue  Amlodipine 5 mg daily  Lisinopril 40 mg daily  Metoprolol 50 mg bid       Relevant Medications   lisinopril (ZESTRIL) 40 MG tablet     Endocrine    Hypothyroidism    Hypothyroidism  Pt has no clinical changes No change in energy level/ hair or skin/ edema and no tremor Lab Results  Component Value Date   TSH 2.86 03/26/2023    Continues levothyroxine 75 mcg daily      Relevant Medications   levothyroxine (SYNTHROID) 75 MCG tablet   Hyperlipidemia associated with type 2 diabetes mellitus (HCC)    Disc goals for lipids and reasons to control them Rev last labs with pt Rev low sat fat diet in detail  Lab today  Now back on rosuvastatin 10 mg daily      Relevant Medications   glipiZIDE (GLUCOTROL XL) 10 MG 24 hr tablet   lisinopril (ZESTRIL) 40 MG tablet   Other Relevant Orders   Basic metabolic panel   Lipid panel   DM type 2 with diabetic peripheral neuropathy (HCC) - Primary    Lab Results  Component Value Date   HGBA1C 13.3 (H) 03/26/2023  Since last visit diet is better Is much more compliant with medicatinos  Metformin xr 1000 mg bid Glipizide xl 10 mg daily  Lantus 10 u daily  Unfortunately did not check glucose as planned  Instructed to check tid for 2 wk and send results  If CGM was affordable -open to that  Continue DM diet and exercise  Some neuropathy symptoms now -may benefit from gabapentin after assessing circulation  Continues to have no insurance  Would like to try GLP in future if affordable   Send glucose readings in 2 wk Follow up late aug       Relevant Medications   glipiZIDE (GLUCOTROL XL) 10 MG 24 hr tablet   lisinopril (ZESTRIL) 40 MG  tablet     Other   Does not have health insurance    Aware No plans to get Given resources       Claudication of calf muscles (HCC)    ABI s ordered  Some calf pain /claudication  Unable to feel clear pedal pulses       Relevant Orders   VAS [place] ABI WITH/WO TBI

## 2023-05-08 NOTE — Assessment & Plan Note (Signed)
Aware No plans to get Given resources

## 2023-05-08 NOTE — Assessment & Plan Note (Signed)
bp in fair control at this time  BP Readings from Last 1 Encounters:  05/08/23 128/78   No changes needed Most recent labs reviewed  Disc lifstyle change with low sodium diet and exercise  No changes  Plan to continue  Amlodipine 5 mg daily  Lisinopril 40 mg daily  Metoprolol 50 mg bid

## 2023-05-08 NOTE — Assessment & Plan Note (Signed)
Lab Results  Component Value Date   HGBA1C 13.3 (H) 03/26/2023   Since last visit diet is better Is much more compliant with medicatinos  Metformin xr 1000 mg bid Glipizide xl 10 mg daily  Lantus 10 u daily  Unfortunately did not check glucose as planned  Instructed to check tid for 2 wk and send results  If CGM was affordable -open to that  Continue DM diet and exercise  Some neuropathy symptoms now -may benefit from gabapentin after assessing circulation  Continues to have no insurance  Would like to try GLP in future if affordable   Send glucose readings in 2 wk Follow up late aug

## 2023-05-08 NOTE — Assessment & Plan Note (Signed)
Disc goals for lipids and reasons to control them Rev last labs with pt Rev low sat fat diet in detail  Lab today  Now back on rosuvastatin 10 mg daily

## 2023-05-08 NOTE — Assessment & Plan Note (Signed)
Hypothyroidism  Pt has no clinical changes No change in energy level/ hair or skin/ edema and no tremor Lab Results  Component Value Date   TSH 2.86 03/26/2023    Continues levothyroxine 75 mcg daily

## 2023-05-08 NOTE — Patient Instructions (Addendum)
Start checking glucose  Am fating  2 hours after a meal  A random time   Keep a log  Send these to me in 2 weeks  Then we will most likely go up on lantus dose   Check with pharmacist to check on price of a CGM (constant glucose monitor)- free style libre and the dexcom are 2 brands  If affordable send me know    Labs today for cholesterol and chemistries   I want to check your circulation in feet/legs  I put the referral in for ABI test in   Please let us know if you don't hear in 1-2 weeks     Follow up for a visit after 06/26/23     Try and stick to a low glycemic diet Try to get most of your carbohydrates from produce (with the exception of white potatoes)  Eat less bread/pasta/rice/snack foods/cereals/sweets and other items from the middle of the grocery store (processed carbs)  Exercise as tolerated

## 2023-05-17 ENCOUNTER — Telehealth: Payer: Self-pay | Admitting: Family Medicine

## 2023-05-17 DIAGNOSIS — I739 Peripheral vascular disease, unspecified: Secondary | ICD-10-CM

## 2023-05-17 NOTE — Telephone Encounter (Signed)
I ordered ABI test for claudication  Would someone please check on the status of that order?  Thanks

## 2023-05-17 NOTE — Telephone Encounter (Signed)
Don't see a referral for a vein specialist has been placed will route to PCP for review

## 2023-05-17 NOTE — Telephone Encounter (Signed)
Patient called in to ask about receiving a referral to a vein specialist for her legs. She said that her and Dr Milinda Antis spoke about this when she seen her last.

## 2023-05-24 ENCOUNTER — Ambulatory Visit (INDEPENDENT_AMBULATORY_CARE_PROVIDER_SITE_OTHER): Payer: Self-pay

## 2023-05-24 DIAGNOSIS — I739 Peripheral vascular disease, unspecified: Secondary | ICD-10-CM

## 2023-05-30 LAB — VAS US ABI WITH/WO TBI
Left ABI: 0.62
Right ABI: 0.55

## 2023-06-04 ENCOUNTER — Other Ambulatory Visit: Payer: Self-pay

## 2023-06-04 MED ORDER — INSULIN GLARGINE 100 UNIT/ML ~~LOC~~ SOLN: 30.0000 [IU] | Freq: Every day | SUBCUTANEOUS | 11 refills | Status: DC

## 2023-06-04 NOTE — Addendum Note (Signed)
Addended by: Roxy Manns A on: 06/04/2023 08:13 PM   Modules accepted: Orders

## 2023-06-06 ENCOUNTER — Encounter (INDEPENDENT_AMBULATORY_CARE_PROVIDER_SITE_OTHER): Payer: Self-pay

## 2023-06-07 NOTE — Telephone Encounter (Signed)
Patient called the office in regards to this referral request, asked if Dr. Milinda Antis is going to refer her to a vascular surgeon? York Spaniel she went to her appointment on 7/18 with [facility], and after some tests they said they would refer her to a vascular surgeon. Patient can be reached at mobile number if needed

## 2023-06-07 NOTE — Telephone Encounter (Signed)
Tried to call patient but no answer and vm not setup

## 2023-06-07 NOTE — Addendum Note (Signed)
Addended by: Roxy Manns A on: 06/07/2023 03:54 PM   Modules accepted: Orders

## 2023-06-07 NOTE — Telephone Encounter (Signed)
I put the referral in  Please let us know if you don't hear in 1-2 weeks   

## 2023-06-08 NOTE — Telephone Encounter (Signed)
Called patient and reviewed all information. Patient verbalized understanding. Will call if any further questions.  

## 2023-06-26 ENCOUNTER — Ambulatory Visit (INDEPENDENT_AMBULATORY_CARE_PROVIDER_SITE_OTHER): Payer: Self-pay | Admitting: Family Medicine

## 2023-06-26 ENCOUNTER — Encounter: Payer: Self-pay | Admitting: Family Medicine

## 2023-06-26 VITALS — BP 121/80 | HR 68 | Temp 97.6°F | Ht 66.0 in | Wt 191.0 lb

## 2023-06-26 DIAGNOSIS — E1140 Type 2 diabetes mellitus with diabetic neuropathy, unspecified: Secondary | ICD-10-CM

## 2023-06-26 DIAGNOSIS — E871 Hypo-osmolality and hyponatremia: Secondary | ICD-10-CM

## 2023-06-26 DIAGNOSIS — E669 Obesity, unspecified: Secondary | ICD-10-CM

## 2023-06-26 DIAGNOSIS — Z7984 Long term (current) use of oral hypoglycemic drugs: Secondary | ICD-10-CM

## 2023-06-26 DIAGNOSIS — I10 Essential (primary) hypertension: Secondary | ICD-10-CM

## 2023-06-26 DIAGNOSIS — E1169 Type 2 diabetes mellitus with other specified complication: Secondary | ICD-10-CM

## 2023-06-26 DIAGNOSIS — I739 Peripheral vascular disease, unspecified: Secondary | ICD-10-CM

## 2023-06-26 DIAGNOSIS — E1142 Type 2 diabetes mellitus with diabetic polyneuropathy: Secondary | ICD-10-CM

## 2023-06-26 DIAGNOSIS — E785 Hyperlipidemia, unspecified: Secondary | ICD-10-CM

## 2023-06-26 LAB — POCT GLYCOSYLATED HEMOGLOBIN (HGB A1C): Hemoglobin A1C: 11.7 % — AB (ref 4.0–5.6)

## 2023-06-26 NOTE — Assessment & Plan Note (Signed)
Bmi of 30.8 Discussed how this problem influences overall health and the risks it imposes  Reviewed plan for weight loss with lower calorie diet (via better food choices (lower glycemic and portion control) along with exercise building up to or more than 30 minutes 5 days per week including some aerobic activity and strength training     Water exercise is helpful with history of claudication

## 2023-06-26 NOTE — Assessment & Plan Note (Signed)
bp in fair control at this time  BP Readings from Last 1 Encounters:  06/26/23 121/80   No changes needed Most recent labs reviewed  Disc lifstyle change with low sodium diet and exercise  No changes  Plan to continue  Amlodipine 5 mg daily  Lisinopril 40 mg daily  Metoprolol 50 mg bid

## 2023-06-26 NOTE — Progress Notes (Signed)
Subjective:    Patient ID: Pam Patel, female    DOB: February 29, 1961, 62 y.o.   MRN: [id number]  HPI  Wt Readings from Last 3 Encounters:  06/26/23 191 lb (86.6 kg)  05/08/23 189 lb 12.8 oz (86.1 kg)  03/26/23 191 lb 8 oz (86.9 kg)   30.83 kg/m  Vitals:   06/26/23 1116 06/26/23 1149  BP: 134/80 121/80  Pulse: 68   Temp: 97.6 F (36.4 C)   SpO2: 97%     Pt presents for follow up of DM2 and chronic health problems   Lab Results  Component Value Date   HGBA1C 11.7 (A) 06/26/2023   Now improved to 11.7   Metformin xr 1000 mg bid  Glipizide xl 10 mg daily  Lantus 10 u daily -went up to 30 u daily   Did a referral to endocrinology - Dr Lacretia Leigh - sept 4    Glucose levels at home recently average in mid to high 200s  One 379 before bed on Monday (after eating french fries)   Has not had fast food in 3 months! Proud of this  Eating a lot of veggies and salads  Fruit instead of sweets or simple carbs   No hypoglycemia   Exercise works out in pool daily  Some gardening      Lab Results  Component Value Date   NA 132 (L) 05/08/2023   K 4.5 05/08/2023   CO2 25 05/08/2023   GLUCOSE 330 (H) 05/08/2023   BUN 13 05/08/2023   CREATININE 0.78 05/08/2023   CALCIUM 10.9 (H) 05/08/2023   GFR 80.85 05/08/2023   GFRNONAA >60 02/22/2018   Keeps low sodium    Had abnormal ABIs - ref to vasc surgery Moderate vasc dz on both sides  Her calves used to hurt with walking-now has more pain in the whole leg   Can walk more than 5 minutes without taking a break  Feels like legs are very heavy  This limits her exercise  She has back problems also uses heat)      Cholesterol Lab Results  Component Value Date   CHOL 136 05/08/2023   HDL 32.00 (L) 05/08/2023   LDLCALC 86 12/02/2019   LDLDIRECT 72.0 05/08/2023   TRIG 206.0 (H) 05/08/2023   CHOLHDL 4 05/08/2023   Crestor 10 mg daily     Patient Active Problem List   Diagnosis Date Noted    Hyponatremia 06/26/2023   Does not have health insurance 05/08/2023   Claudication of calf muscles (HCC) 03/26/2023   Poor compliance 03/26/2023   Obesity (BMI 30.0-34.9) 08/08/2020   Hot flashes 08/28/2018   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   Dental disease 03/07/2018   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   Lung nodule 03/07/2018   Hypertension 03/06/2018   Panic disorder 03/06/2018   Thyromegaly 03/06/2018   DM type 2 with diabetic peripheral neuropathy (HCC) 03/06/2018   Hypothyroidism 10/05/2008   STRESS REACTION, ACUTE, WITH EMOTIONAL DISTURBANCE 10/05/2008   Hyperlipidemia associated with type 2 diabetes mellitus (HCC) 01/15/2008   Former smoker 01/15/2008   ALLERGIC RHINITIS, SEASONAL 01/15/2008   DEGENERATIVE DISC DISEASE, 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   Past Medical History:  Diagnosis Date   History of chickenpox    History of diabetes mellitus, type II    History of hypertension    Hypertension    Past Surgical History:  Procedure Laterality Date   APPENDECTOMY  1990   BACK SURGERY     Social History   Tobacco  Use   Smoking status: Former   Smokeless tobacco: Never  Substance Use Topics   Alcohol use: Not Currently   Drug use: Never   Family History  Problem Relation Age of Onset   COPD Mother    Diabetes Mother    Heart attack Mother    Heart disease Mother    Hypertension Mother    Stroke Mother    Heart attack Father    Allergies  Allergen Reactions   Penicillins Hives    Has patient had a PCN reaction causing immediate rash, facial/tongue/throat swelling, SOB or lightheadedness with hypotension: Unknown Has patient had a PCN reaction causing severe rash involving mucus membranes or skin necrosis: No Has patient had a PCN reaction that required hospitalization: No Has patient had a PCN reaction occurring within the last 10 years: No If all of the above answers are "NO", then may proceed with Cephalosporin use.    Naproxen     REACTION: hives    Current Outpatient Medications on File Prior to Visit  Medication Sig Dispense Refill   amLODipine (NORVASC) 5 MG tablet TAKE 1 TABLET (5 MG TOTAL) BY MOUTH DAILY. 90 tablet 1   aspirin EC 81 MG tablet Take 81 mg by mouth daily.      blood glucose meter kit and supplies KIT Dispense based on patient and insurance preference. Use up to four times daily as directed. (FOR ICD-9 250.00, 250.01). 1 each 0   glipiZIDE (GLUCOTROL XL) 10 MG 24 hr tablet Take 1 tablet (10 mg total) by mouth daily with breakfast. 90 tablet 1   insulin glargine (LANTUS) 100 UNIT/ML injection Inject 0.3 mLs (30 Units total) into the skin daily. (Patient taking differently: Inject 40 Units into the skin daily.) 20 mL 11   Insulin Syringe-Needle U-100 (INSULIN SYRINGE .5CC/28G) 28G X 1/2" 0.5 ML MISC Use with insulin once daily 100 each 1   levothyroxine (SYNTHROID) 75 MCG tablet TAKE 1 TABLET BY MOUTH EVERY DAY BEFORE BREAKFAST 90 tablet 1   lisinopril (ZESTRIL) 40 MG tablet Take 1 tablet (40 mg total) by mouth daily. 90 tablet 1   metFORMIN (GLUCOPHAGE-XR) 500 MG 24 hr tablet TAKE 2 PILLS BY MOUTH EVERY MORNING AND 1 PILL EVERY EVENING 270 tablet 0   metoprolol tartrate (LOPRESSOR) 50 MG tablet TAKE 1 TABLET BY MOUTH TWICE A DAY 180 tablet 1   PARoxetine (PAXIL) 20 MG tablet TAKE 1 TABLET BY MOUTH EVERY DAY 90 tablet 0   rosuvastatin (CRESTOR) 10 MG tablet Take 1 tablet (10 mg total) by mouth daily. 90 tablet 3   No current facility-administered medications on file prior to visit.    Review of Systems  Constitutional:  Negative for activity change, appetite change, fatigue, fever and unexpected weight change.  HENT:  Negative for congestion, ear pain, rhinorrhea, sinus pressure and sore throat.   Eyes:  Negative for pain, redness and visual disturbance.  Respiratory:  Negative for cough, shortness of breath and wheezing.   Cardiovascular:  Negative for chest pain and palpitations.  Gastrointestinal:  Negative for  abdominal pain, blood in stool, constipation and diarrhea.  Endocrine: Negative for polydipsia and polyuria.  Genitourinary:  Negative for dysuria, frequency and urgency.  Musculoskeletal:  Positive for back pain. Negative for arthralgias, joint swelling and myalgias.       Some leg pain with walking    Skin:  Negative for pallor and rash.  Allergic/Immunologic: Negative for environmental allergies.  Neurological:  Negative for dizziness, syncope and  headaches.  Hematological:  Negative for adenopathy. Does not bruise/bleed easily.  Psychiatric/Behavioral:  Negative for decreased concentration and dysphoric mood. The patient is nervous/anxious.        Objective:   Physical Exam Constitutional:      General: She is not in acute distress.    Appearance: Normal appearance. She is well-developed. She is obese. She is not ill-appearing or diaphoretic.  HENT:     Head: Normocephalic and atraumatic.  Eyes:     Conjunctiva/sclera: Conjunctivae normal.     Pupils: Pupils are equal, round, and reactive to light.  Neck:     Thyroid: No thyromegaly.     Vascular: No carotid bruit or JVD.  Cardiovascular:     Rate and Rhythm: Normal rate and regular rhythm.     Heart sounds: Normal heart sounds.     No gallop.     Comments: Pulses are difficult to palpate in feet  Pulmonary:     Effort: Pulmonary effort is normal. No respiratory distress.     Breath sounds: Normal breath sounds. No wheezing or rales.  Abdominal:     General: There is no distension or abdominal bruit.     Palpations: Abdomen is soft.  Musculoskeletal:     Cervical back: Normal range of motion and neck supple.     Right lower leg: No edema.     Left lower leg: No edema.  Lymphadenopathy:     Cervical: No cervical adenopathy.  Skin:    General: Skin is warm and dry.     Coloration: Skin is not jaundiced or pale.     Findings: No bruising or rash.     Comments: Ruddy complexion   Neurological:     Mental Status: She  is alert.     Coordination: Coordination normal.     Deep Tendon Reflexes: Reflexes are normal and symmetric. Reflexes normal.  Psychiatric:        Mood and Affect: Mood normal.           Assessment & Plan:   Problem List Items Addressed This Visit       Cardiovascular and Mediastinum   Hypertension    bp in fair control at this time  BP Readings from Last 1 Encounters:  06/26/23 121/80   No changes needed Most recent labs reviewed  Disc lifstyle change with low sodium diet and exercise  No changes  Plan to continue  Amlodipine 5 mg daily  Lisinopril 40 mg daily  Metoprolol 50 mg bid         Endocrine   Hyperlipidemia associated with type 2 diabetes mellitus (HCC)    Disc goals for lipids and reasons to control them Rev last labs with pt Rev low sat fat diet in detail   LDL almost at goal of 72 on crestor 10 mg daily  Tolerating fairly   Has PVD and vascular consult upcoming       DM type 2 with diabetic peripheral neuropathy (HCC)    Lab Results  Component Value Date   HGBA1C 11.7 (A) 06/26/2023   This is down from 13.3  Reviewed glucose levels-improving Much better diet  Continues  Metformin xr 1000 mg bid  Glipizide xl 10 mg daily  Lantus -will increase from 30 to 40 u daily  Has new endocrinology appt early sept to get established  Could not afford cgm Would be interested in GLP-1 if affordable in future  Continues to do water exercise  Other   Obesity (BMI 30.0-34.9)    Bmi of 30.8 Discussed how this problem influences overall health and the risks it imposes  Reviewed plan for weight loss with lower calorie diet (via better food choices (lower glycemic and portion control) along with exercise building up to or more than 30 minutes 5 days per week including some aerobic activity and strength training     Water exercise is helpful with history of claudication        Hyponatremia    Unsure if this may be due to polyuria from her  DM2 Also hypothyroid Will continue to monitor Follow up 3 mo       Claudication of calf muscles (HCC)    Moderate vasc dz both legs with claudication  Can exercise in pool w/o symptoms  LDL down to 72 with crestor 10  Has vasc surgery appt upcoming early sept        Other Visit Diagnoses     Type 2 diabetes mellitus with diabetic neuropathy, without long-term current use of insulin (HCC)    -  Primary   Relevant Orders   POCT HgB A1C (Completed)

## 2023-06-26 NOTE — Assessment & Plan Note (Signed)
Moderate vasc dz both legs with claudication  Can exercise in pool w/o symptoms  LDL down to 72 with crestor 10  Has vasc surgery appt upcoming early sept

## 2023-06-26 NOTE — Assessment & Plan Note (Signed)
Unsure if this may be due to polyuria from her DM2 Also hypothyroid Will continue to monitor Follow up 3 mo

## 2023-06-26 NOTE — Patient Instructions (Addendum)
Let's go up on lantus to 40 units daily   Keep watching your diet   Follow up with your endocrinologist as planned   Follow up with the vascular specialist as planned   Take care of yourself   Follow up here in 3 months

## 2023-06-26 NOTE — Assessment & Plan Note (Signed)
Disc goals for lipids and reasons to control them Rev last labs with pt Rev low sat fat diet in detail   LDL almost at goal of 72 on crestor 10 mg daily  Tolerating fairly   Has PVD and vascular consult upcoming

## 2023-06-26 NOTE — Assessment & Plan Note (Signed)
Lab Results  Component Value Date   HGBA1C 11.7 (A) 06/26/2023   This is down from 13.3  Reviewed glucose levels-improving Much better diet  Continues  Metformin xr 1000 mg bid  Glipizide xl 10 mg daily  Lantus -will increase from 30 to 40 u daily  Has new endocrinology appt early sept to get established  Could not afford cgm Would be interested in GLP-1 if affordable in future  Continues to do water exercise

## 2023-06-27 ENCOUNTER — Ambulatory Visit: Payer: Self-pay | Admitting: Family Medicine

## 2023-07-10 ENCOUNTER — Ambulatory Visit (INDEPENDENT_AMBULATORY_CARE_PROVIDER_SITE_OTHER): Payer: Self-pay | Admitting: Vascular Surgery

## 2023-07-10 ENCOUNTER — Encounter (INDEPENDENT_AMBULATORY_CARE_PROVIDER_SITE_OTHER): Payer: Self-pay | Admitting: Vascular Surgery

## 2023-07-10 VITALS — BP 184/101 | HR 76 | Resp 18 | Ht 66.0 in | Wt 191.0 lb

## 2023-07-10 DIAGNOSIS — E1169 Type 2 diabetes mellitus with other specified complication: Secondary | ICD-10-CM

## 2023-07-10 DIAGNOSIS — E785 Hyperlipidemia, unspecified: Secondary | ICD-10-CM

## 2023-07-10 DIAGNOSIS — E1142 Type 2 diabetes mellitus with diabetic polyneuropathy: Secondary | ICD-10-CM

## 2023-07-10 DIAGNOSIS — I10 Essential (primary) hypertension: Secondary | ICD-10-CM

## 2023-07-10 DIAGNOSIS — I70213 Atherosclerosis of native arteries of extremities with intermittent claudication, bilateral legs: Secondary | ICD-10-CM

## 2023-07-10 NOTE — Progress Notes (Signed)
Patient ID: Pam Patel, female   DOB: 12-19-1960, 62 y.o.   MRN: [id number]  Chief Complaint  Patient presents with   New Patient (Initial Visit)    Np consult Claudication of calf muscles US done 05/24/23    HPI Pam Patel is a 62 y.o. female.  I am asked to see the patient by Dr. Milinda Antis for evaluation of leg pain and peripheral arterial disease.  The patient had noninvasive studies performed about 6 weeks ago.  ABIs are 0.55 on the right and 0.62 on the left with diminished waveforms.  She describes many months of worsening symptoms of pain and cramping in her calves particularly with shortening distances of activity.  Now she can only walk 100 feet or so.  Her right leg is the more severely affected of the 2 legs.  No open wounds or infection.  She is starting to have tingling and pain in her feet at night.  She is on aspirin and a statin agent.  She has diabetes which has historically been very poorly controlled which she says is getting better.  She no longer smokes.   Past Medical History:  Diagnosis Date   History of chickenpox    History of diabetes mellitus, type II    History of hypertension    Hypertension     Past Surgical History:  Procedure Laterality Date   APPENDECTOMY  1990   BACK SURGERY       Family History  Problem Relation Age of Onset   COPD Mother    Diabetes Mother    Heart attack Mother    Heart disease Mother    Hypertension Mother    Stroke Mother    Heart attack Father      Social History   Tobacco Use   Smoking status: Former   Smokeless tobacco: Never  Substance Use Topics   Alcohol use: Not Currently   Drug use: Never     Allergies  Allergen Reactions   Penicillins Hives    Has patient had a PCN reaction causing immediate rash, facial/tongue/throat swelling, SOB or lightheadedness with hypotension: Unknown Has patient had a PCN reaction causing severe rash involving mucus membranes or skin necrosis: No Has patient had a  PCN reaction that required hospitalization: No Has patient had a PCN reaction occurring within the last 10 years: No If all of the above answers are "NO", then may proceed with Cephalosporin use.    Naproxen     REACTION: hives    Current Outpatient Medications  Medication Sig Dispense Refill   amLODipine (NORVASC) 5 MG tablet TAKE 1 TABLET (5 MG TOTAL) BY MOUTH DAILY. 90 tablet 1   aspirin EC 81 MG tablet Take 81 mg by mouth daily.      blood glucose meter kit and supplies KIT Dispense based on patient and insurance preference. Use up to four times daily as directed. (FOR ICD-9 250.00, 250.01). 1 each 0   glipiZIDE (GLUCOTROL XL) 10 MG 24 hr tablet Take 1 tablet (10 mg total) by mouth daily with breakfast. 90 tablet 1   insulin glargine (LANTUS) 100 UNIT/ML injection Inject 0.3 mLs (30 Units total) into the skin daily. (Patient taking differently: Inject 40 Units into the skin daily.) 20 mL 11   Insulin Syringe-Needle U-100 (INSULIN SYRINGE .5CC/28G) 28G X 1/2" 0.5 ML MISC Use with insulin once daily 100 each 1   levothyroxine (SYNTHROID) 75 MCG tablet TAKE 1 TABLET BY MOUTH EVERY DAY BEFORE  BREAKFAST 90 tablet 1   lisinopril (ZESTRIL) 40 MG tablet Take 1 tablet (40 mg total) by mouth daily. 90 tablet 1   metFORMIN (GLUCOPHAGE-XR) 500 MG 24 hr tablet TAKE 2 PILLS BY MOUTH EVERY MORNING AND 1 PILL EVERY EVENING 270 tablet 0   metoprolol tartrate (LOPRESSOR) 50 MG tablet TAKE 1 TABLET BY MOUTH TWICE A DAY 180 tablet 1   PARoxetine (PAXIL) 20 MG tablet TAKE 1 TABLET BY MOUTH EVERY DAY 90 tablet 0   rosuvastatin (CRESTOR) 10 MG tablet Take 1 tablet (10 mg total) by mouth daily. 90 tablet 3   No current facility-administered medications for this visit.      REVIEW OF SYSTEMS (Negative unless checked)  Constitutional: [] Weight loss  [] Fever  [] Chills Cardiac: [] Chest pain   [] Chest pressure   [] Palpitations   [] Shortness of breath when laying flat   [] Shortness of breath at rest    [] Shortness of breath with exertion. Vascular:  [x] Pain in legs with walking   [] Pain in legs at rest   [] Pain in legs when laying flat   [] Claudication   [] Pain in feet when walking  [x] Pain in feet at rest  [] Pain in feet when laying flat   [] History of DVT   [] Phlebitis   [] Swelling in legs   [] Varicose veins   [] Non-healing ulcers Pulmonary:   [] Uses home oxygen   [] Productive cough   [] Hemoptysis   [] Wheeze  [] COPD   [] Asthma Neurologic:  [] Dizziness  [] Blackouts   [] Seizures   [] History of stroke   [] History of TIA  [] Aphasia   [] Temporary blindness   [] Dysphagia   [] Weakness or numbness in arms   [] Weakness or numbness in legs Musculoskeletal:  [x] Arthritis   [] Joint swelling   [x] Joint pain   [] Low back pain Hematologic:  [] Easy bruising  [] Easy bleeding   [] Hypercoagulable state   [] Anemic  [] Hepatitis Gastrointestinal:  [] Blood in stool   [] Vomiting blood  [] Gastroesophageal reflux/heartburn   [] Abdominal pain Genitourinary:  [] Chronic kidney disease   [] Difficult urination  [] Frequent urination  [] Burning with urination   [] Hematuria Skin:  [] Rashes   [] Ulcers   [] Wounds Psychological:  [] History of anxiety   []  History of major depression.    Physical Exam BP (!) 184/101 (BP Location: Right Wrist)   Pulse 76   Resp 18   Ht 5\' 6"  (1.676 m)   Wt 191 lb (86.6 kg)   BMI 30.83 kg/m  Gen:  WD/WN, NAD Head: [place]/AT, No temporalis wasting.  Ear/Nose/Throat: Hearing grossly intact, nares w/o erythema or drainage, oropharynx w/o Erythema/Exudate Eyes: Conjunctiva clear, sclera non-icteric  Neck: trachea midline.  No JVD.  Pulmonary:  Good air movement, respirations not labored, no use of accessory muscles  Cardiac: RRR, no JVD Vascular:  Vessel Right Left  Radial Palpable Palpable                          DP NP 1+  PT 1+ Trace    Gastrointestinal:. No masses, surgical incisions, or scars. Musculoskeletal: M/S 5/5 throughout.  Extremities without ischemic changes.  No  deformity or atrophy. No edema. Neurologic: Sensation grossly intact in extremities.  Symmetrical.  Speech is fluent. Motor exam as listed above. Psychiatric: Judgment intact, Mood & affect appropriate for pt's clinical situation. Dermatologic: No rashes or ulcers noted.  No cellulitis or open wounds.    Radiology No results found.  Labs Recent Results (from the past 2160 hour(s))  Basic metabolic panel  Status: Abnormal   Collection Time: 05/08/23 12:06 PM  Result Value Ref Range   Sodium 132 (L) 135 - 145 mEq/L   Potassium 4.5 3.5 - 5.1 mEq/L   Chloride 99 96 - 112 mEq/L   CO2 25 19 - 32 mEq/L   Glucose, Bld 330 (H) 70 - 99 mg/dL   BUN 13 6 - 23 mg/dL   Creatinine, Ser 7.84 0.40 - 1.20 mg/dL   GFR 69.62 >95.28 mL/min    Comment: Calculated using the CKD-EPI Creatinine Equation (2021)   Calcium 10.9 (H) 8.4 - 10.5 mg/dL  Lipid panel     Status: Abnormal   Collection Time: 05/08/23 12:06 PM  Result Value Ref Range   Cholesterol 136 0 - 200 mg/dL    Comment: ATP III Classification       Desirable:  < 200 mg/dL               Borderline High:  200 - 239 mg/dL          High:  > = 413 mg/dL   Triglycerides 244.0 (H) 0.0 - 149.0 mg/dL    Comment: Normal:  <102 mg/dLBorderline High:  150 - 199 mg/dL   HDL 72.53 (L) >66.44 mg/dL   VLDL 03.4 (H) 0.0 - 74.2 mg/dL   Total CHOL/HDL Ratio 4     Comment:                Men          Women1/2 Average Risk     3.4          3.3Average Risk          5.0          4.42X Average Risk          9.6          7.13X Average Risk          15.0          11.0                       NonHDL 103.73     Comment: NOTE:  Non-HDL goal should be 30 mg/dL higher than patient's LDL goal (i.e. LDL goal of < 70 mg/dL, would have non-HDL goal of < 100 mg/dL)  LDL cholesterol, direct     Status: None   Collection Time: 05/08/23 12:06 PM  Result Value Ref Range   Direct LDL 72.0 mg/dL    Comment: Optimal:  <595 mg/dLNear or Above Optimal:  100-129 mg/dLBorderline  High:  130-159 mg/dLHigh:  160-189 mg/dLVery High:  >190 mg/dL  VAS [place] ABI WITH/WO TBI     Status: None   Collection Time: 05/24/23 11:40 AM  Result Value Ref Range   Right ABI 0.55    Left ABI 0.62   POCT HgB A1C     Status: Abnormal   Collection Time: 06/26/23 11:31 AM  Result Value Ref Range   Hemoglobin A1C 11.7 (A) 4.0 - 5.6 %   HbA1c POC (<> result, manual entry)     HbA1c, POC (prediabetic range)     HbA1c, POC (controlled diabetic range)      Assessment/Plan:  Hypertension blood pressure control important in reducing the progression of atherosclerotic disease. On appropriate oral medications.   DM type 2 with diabetic peripheral neuropathy (HCC) blood glucose control important in reducing the progression of atherosclerotic disease. Also, involved in wound healing. On appropriate medications.   Hyperlipidemia  associated with type 2 diabetes mellitus (HCC) lipid control important in reducing the progression of atherosclerotic disease. Continue statin therapy   Atherosclerosis of native arteries of extremity with intermittent claudication (HCC) ABIs are 0.55 on the right and 0.62 on the left with diminished waveforms.  The patient has disabling claudication symptoms worse on the right than the.  She has symptoms now worrisome for early rest pain.  We discussed in some detail the pathophysiology and natural history of peripheral arterial disease.  She is on antiplatelet and statin agent already.  We discussed addition of Plavix and an exercise regimen.  She no longer smokes.  She is at a point where her symptoms are disabling and she desires to have engine.  We discussed angiography with possible concomitant intervention depending on the anatomy encountered at the time of the angiogram.  We discussed that generally we have to do 1 leg at a time and she prefers to have the right leg done first.  Risks and benefits of the procedure were discussed in detail and she and her husband are  agreeable to proceeding.      Festus Barren 07/11/2023, 4:18 PM   This note was created with Dragon medical transcription system.  Any errors from dictation are unintentional.

## 2023-07-10 NOTE — H&P (View-Only) (Signed)
Patient ID: Pam Patel, female   DOB: 11/06/1961, 62 y.o.   MRN: [id number]  Chief Complaint  Patient presents with   New Patient (Initial Visit)    Np consult Claudication of calf muscles US done 05/24/23    HPI Pam Patel is a 62 y.o. female.  I am asked to see the patient by Dr. Milinda Antis for evaluation of leg pain and peripheral arterial disease.  The patient had noninvasive studies performed about 6 weeks ago.  ABIs are 0.55 on the right and 0.62 on the left with diminished waveforms.  She describes many months of worsening symptoms of pain and cramping in her calves particularly with shortening distances of activity.  Now she can only walk 100 feet or so.  Her right leg is the more severely affected of the 2 legs.  No open wounds or infection.  She is starting to have tingling and pain in her feet at night.  She is on aspirin and a statin agent.  She has diabetes which has historically been very poorly controlled which she says is getting better.  She no longer smokes.   Past Medical History:  Diagnosis Date   History of chickenpox    History of diabetes mellitus, type II    History of hypertension    Hypertension     Past Surgical History:  Procedure Laterality Date   APPENDECTOMY  1990   BACK SURGERY       Family History  Problem Relation Age of Onset   COPD Mother    Diabetes Mother    Heart attack Mother    Heart disease Mother    Hypertension Mother    Stroke Mother    Heart attack Father      Social History   Tobacco Use   Smoking status: Former   Smokeless tobacco: Never  Substance Use Topics   Alcohol use: Not Currently   Drug use: Never     Allergies  Allergen Reactions   Penicillins Hives    Has patient had a PCN reaction causing immediate rash, facial/tongue/throat swelling, SOB or lightheadedness with hypotension: Unknown Has patient had a PCN reaction causing severe rash involving mucus membranes or skin necrosis: No Has patient had a  PCN reaction that required hospitalization: No Has patient had a PCN reaction occurring within the last 10 years: No If all of the above answers are "NO", then may proceed with Cephalosporin use.    Naproxen     REACTION: hives    Current Outpatient Medications  Medication Sig Dispense Refill   amLODipine (NORVASC) 5 MG tablet TAKE 1 TABLET (5 MG TOTAL) BY MOUTH DAILY. 90 tablet 1   aspirin EC 81 MG tablet Take 81 mg by mouth daily.      blood glucose meter kit and supplies KIT Dispense based on patient and insurance preference. Use up to four times daily as directed. (FOR ICD-9 250.00, 250.01). 1 each 0   glipiZIDE (GLUCOTROL XL) 10 MG 24 hr tablet Take 1 tablet (10 mg total) by mouth daily with breakfast. 90 tablet 1   insulin glargine (LANTUS) 100 UNIT/ML injection Inject 0.3 mLs (30 Units total) into the skin daily. (Patient taking differently: Inject 40 Units into the skin daily.) 20 mL 11   Insulin Syringe-Needle U-100 (INSULIN SYRINGE .5CC/28G) 28G X 1/2" 0.5 ML MISC Use with insulin once daily 100 each 1   levothyroxine (SYNTHROID) 75 MCG tablet TAKE 1 TABLET BY MOUTH EVERY DAY BEFORE  BREAKFAST 90 tablet 1   lisinopril (ZESTRIL) 40 MG tablet Take 1 tablet (40 mg total) by mouth daily. 90 tablet 1   metFORMIN (GLUCOPHAGE-XR) 500 MG 24 hr tablet TAKE 2 PILLS BY MOUTH EVERY MORNING AND 1 PILL EVERY EVENING 270 tablet 0   metoprolol tartrate (LOPRESSOR) 50 MG tablet TAKE 1 TABLET BY MOUTH TWICE A DAY 180 tablet 1   PARoxetine (PAXIL) 20 MG tablet TAKE 1 TABLET BY MOUTH EVERY DAY 90 tablet 0   rosuvastatin (CRESTOR) 10 MG tablet Take 1 tablet (10 mg total) by mouth daily. 90 tablet 3   No current facility-administered medications for this visit.      REVIEW OF SYSTEMS (Negative unless checked)  Constitutional: [] Weight loss  [] Fever  [] Chills Cardiac: [] Chest pain   [] Chest pressure   [] Palpitations   [] Shortness of breath when laying flat   [] Shortness of breath at rest    [] Shortness of breath with exertion. Vascular:  [x] Pain in legs with walking   [] Pain in legs at rest   [] Pain in legs when laying flat   [] Claudication   [] Pain in feet when walking  [x] Pain in feet at rest  [] Pain in feet when laying flat   [] History of DVT   [] Phlebitis   [] Swelling in legs   [] Varicose veins   [] Non-healing ulcers Pulmonary:   [] Uses home oxygen   [] Productive cough   [] Hemoptysis   [] Wheeze  [] COPD   [] Asthma Neurologic:  [] Dizziness  [] Blackouts   [] Seizures   [] History of stroke   [] History of TIA  [] Aphasia   [] Temporary blindness   [] Dysphagia   [] Weakness or numbness in arms   [] Weakness or numbness in legs Musculoskeletal:  [x] Arthritis   [] Joint swelling   [x] Joint pain   [] Low back pain Hematologic:  [] Easy bruising  [] Easy bleeding   [] Hypercoagulable state   [] Anemic  [] Hepatitis Gastrointestinal:  [] Blood in stool   [] Vomiting blood  [] Gastroesophageal reflux/heartburn   [] Abdominal pain Genitourinary:  [] Chronic kidney disease   [] Difficult urination  [] Frequent urination  [] Burning with urination   [] Hematuria Skin:  [] Rashes   [] Ulcers   [] Wounds Psychological:  [] History of anxiety   []  History of major depression.    Physical Exam BP (!) 184/101 (BP Location: Right Wrist)   Pulse 76   Resp 18   Ht 5\' 6"  (1.676 m)   Wt 191 lb (86.6 kg)   BMI 30.83 kg/m  Gen:  WD/WN, NAD Head: [place]/AT, No temporalis wasting.  Ear/Nose/Throat: Hearing grossly intact, nares w/o erythema or drainage, oropharynx w/o Erythema/Exudate Eyes: Conjunctiva clear, sclera non-icteric  Neck: trachea midline.  No JVD.  Pulmonary:  Good air movement, respirations not labored, no use of accessory muscles  Cardiac: RRR, no JVD Vascular:  Vessel Right Left  Radial Palpable Palpable                          DP NP 1+  PT 1+ Trace    Gastrointestinal:. No masses, surgical incisions, or scars. Musculoskeletal: M/S 5/5 throughout.  Extremities without ischemic changes.  No  deformity or atrophy. No edema. Neurologic: Sensation grossly intact in extremities.  Symmetrical.  Speech is fluent. Motor exam as listed above. Psychiatric: Judgment intact, Mood & affect appropriate for pt's clinical situation. Dermatologic: No rashes or ulcers noted.  No cellulitis or open wounds.    Radiology No results found.  Labs Recent Results (from the past 2160 hour(s))  Basic metabolic panel  Status: Abnormal   Collection Time: 05/08/23 12:06 PM  Result Value Ref Range   Sodium 132 (L) 135 - 145 mEq/L   Potassium 4.5 3.5 - 5.1 mEq/L   Chloride 99 96 - 112 mEq/L   CO2 25 19 - 32 mEq/L   Glucose, Bld 330 (H) 70 - 99 mg/dL   BUN 13 6 - 23 mg/dL   Creatinine, Ser 5.95 0.40 - 1.20 mg/dL   GFR 63.87 >56.43 mL/min    Comment: Calculated using the CKD-EPI Creatinine Equation (2021)   Calcium 10.9 (H) 8.4 - 10.5 mg/dL  Lipid panel     Status: Abnormal   Collection Time: 05/08/23 12:06 PM  Result Value Ref Range   Cholesterol 136 0 - 200 mg/dL    Comment: ATP III Classification       Desirable:  < 200 mg/dL               Borderline High:  200 - 239 mg/dL          High:  > = 329 mg/dL   Triglycerides 518.8 (H) 0.0 - 149.0 mg/dL    Comment: Normal:  <416 mg/dLBorderline High:  150 - 199 mg/dL   HDL 60.63 (L) >01.60 mg/dL   VLDL 10.9 (H) 0.0 - 32.3 mg/dL   Total CHOL/HDL Ratio 4     Comment:                Men          Women1/2 Average Risk     3.4          3.3Average Risk          5.0          4.42X Average Risk          9.6          7.13X Average Risk          15.0          11.0                       NonHDL 103.73     Comment: NOTE:  Non-HDL goal should be 30 mg/dL higher than patient's LDL goal (i.e. LDL goal of < 70 mg/dL, would have non-HDL goal of < 100 mg/dL)  LDL cholesterol, direct     Status: None   Collection Time: 05/08/23 12:06 PM  Result Value Ref Range   Direct LDL 72.0 mg/dL    Comment: Optimal:  <557 mg/dLNear or Above Optimal:  100-129 mg/dLBorderline  High:  130-159 mg/dLHigh:  160-189 mg/dLVery High:  >190 mg/dL  VAS [place] ABI WITH/WO TBI     Status: None   Collection Time: 05/24/23 11:40 AM  Result Value Ref Range   Right ABI 0.55    Left ABI 0.62   POCT HgB A1C     Status: Abnormal   Collection Time: 06/26/23 11:31 AM  Result Value Ref Range   Hemoglobin A1C 11.7 (A) 4.0 - 5.6 %   HbA1c POC (<> result, manual entry)     HbA1c, POC (prediabetic range)     HbA1c, POC (controlled diabetic range)      Assessment/Plan:  Hypertension blood pressure control important in reducing the progression of atherosclerotic disease. On appropriate oral medications.   DM type 2 with diabetic peripheral neuropathy (HCC) blood glucose control important in reducing the progression of atherosclerotic disease. Also, involved in wound healing. On appropriate medications.   Hyperlipidemia  associated with type 2 diabetes mellitus (HCC) lipid control important in reducing the progression of atherosclerotic disease. Continue statin therapy   Atherosclerosis of native arteries of extremity with intermittent claudication (HCC) ABIs are 0.55 on the right and 0.62 on the left with diminished waveforms.  The patient has disabling claudication symptoms worse on the right than the.  She has symptoms now worrisome for early rest pain.  We discussed in some detail the pathophysiology and natural history of peripheral arterial disease.  She is on antiplatelet and statin agent already.  We discussed addition of Plavix and an exercise regimen.  She no longer smokes.  She is at a point where her symptoms are disabling and she desires to have engine.  We discussed angiography with possible concomitant intervention depending on the anatomy encountered at the time of the angiogram.  We discussed that generally we have to do 1 leg at a time and she prefers to have the right leg done first.  Risks and benefits of the procedure were discussed in detail and she and her husband are  agreeable to proceeding.      Festus Barren 07/11/2023, 4:18 PM   This note was created with Dragon medical transcription system.  Any errors from dictation are unintentional.

## 2023-07-11 NOTE — Assessment & Plan Note (Signed)
blood glucose control important in reducing the progression of atherosclerotic disease. Also, involved in wound healing. On appropriate medications.  

## 2023-07-11 NOTE — Patient Instructions (Signed)
Angiogram  An angiogram is a procedure used to examine the blood vessels. In this procedure, contrast dye is injected through a soft tube (catheter) into an artery. X-rays are then taken, which show if there is a blockage or problem in a blood vessel. The catheter may be inserted in: The groin area. This is the most common. The fold of the arm, near the elbow. The wrist. Tell a health care provider about: Any allergies you have, including allergies to medicines, shellfish, contrast dye, or iodine. All medicines you are taking, including vitamins, herbs, eye drops, creams, and over-the-counter medicines. Any problems you or family members have had with anesthesia. Any bleeding problems you have. Any surgeries you have had. Any medical conditions you have or have had, including any kidney problems or kidney failure. Whether you are pregnant or may be pregnant. Whether you are breastfeeding. Any condition that may increase your stress and prevent you from lying still. This includes anxiety disorders or chronic pain. What are the risks? Your health care provider will talk with you about risks. These may include: Infection. Bleeding and bruising. Allergic reactions to medicines or dyes, including the contrast dye used. Damage to nearby structures or organs, including damage to blood vessels and kidney damage from the contrast dye. Blood clots that can lead to a stroke or heart attack. Death. What happens before the procedure? When to stop eating and drinking Follow instructions from your health care provider about what you may eat and drink before your procedure. These may include: 8 hours before your procedure Stop eating most foods. Do not eat meat, fried foods, or fatty foods. Eat only light foods, such as toast or crackers. All liquids are okay except energy drinks and alcohol. 6 hours before your procedure Stop eating. Drink only clear liquids, such as water, clear fruit juice,  black coffee, plain tea, and sports drinks. Do not drink energy drinks or alcohol. 2 hours before your procedure Stop drinking all liquids. You may be allowed to take medicines with small sips of water. If you do not follow your health care provider's instructions, your procedure may be delayed or canceled. Medicines Ask your health care provider about: Changing or stopping your regular medicines. These include any diabetes medicines or blood thinners you take. Taking medicines such as aspirin and ibuprofen. These medicines can thin your blood. Do not take them unless your health care provider tells you to. Taking over-the-counter medicines, vitamins, herbs, and supplements. Surgery safety Ask your health care provider: How your insertion site will be marked. What steps will be taken to help prevent infection. These may include: Removing hair at the insertion site. Washing skin with a germ-killing soap. General instructions Do not use any products that contain nicotine or tobacco for at least 4 weeks before the procedure. These products include cigarettes, chewing tobacco, and vaping devices, such as e-cigarettes. If you need help quitting, ask your health care provider. You may have blood samples taken. If you will be going home right after the procedure, plan to have a responsible adult: Take you home from the hospital or clinic. You will not be allowed to drive. Care for you for the time you are told. What happens during the procedure? You will lie on your back on an X-ray table. You may be strapped to the table if it is tilted. An IV will be inserted into one of your veins. Electrodes may be placed on your chest to monitor your heart rate during the procedure.  You will be given one or both of the following: A sedative. This helps you relax. Anesthesia. This will numb the area where the catheter will be inserted. A small incision will be made for catheter insertion. The catheter  will be inserted into an artery using a guide wire. An X-ray procedure (fluoroscopy) will be used to help guide the catheter to the blood vessel to be examined. A contrast dye will then be injected into the catheter, and X-rays will be taken. The contrast will help to show where any narrowing or blockages are located in the blood vessels. You may feel flushed as the contrast dye is injected. Tell your health care provider if you develop chest pain or trouble breathing. After the fluoroscopy is complete, the catheter will be removed. Pressure will be applied to stop bleeding. A closure device may also be used. A bandage (dressing) will be placed over the site where the catheter was inserted. The procedure may vary among health care providers and hospitals. What happens after the procedure? Your blood pressure, heart rate, breathing rate, and blood oxygen level will be monitored until you leave the hospital or clinic. You will be kept in bed lying flat for a period of time. If the catheter was inserted through your leg, you will be instructed not to bend or cross your legs. The insertion area and the pulse in your feet or wrist will be checked often. You will be instructed to drink plenty of fluids. This will help wash the contrast dye out of your body. You may have more blood tests and X-rays. You may also have a test that records the electrical activity of your heart (electrocardiogram, or ECG). If you were given a sedative, do not drive or operate machinery until your health care provider says that it is safe. You may have to avoid lifting. Ask your health care provider how much you can safely lift. It is up to you to get your test results. Ask your health care provider, or the department that is doing the test, when your results will be ready. This information is not intended to replace advice given to you by your health care provider. Make sure you discuss any questions you have with your health  care provider. Document Revised: 05/17/2022 Document Reviewed: 05/17/2022 Elsevier Patient Education  2024 [employer].

## 2023-07-11 NOTE — Assessment & Plan Note (Signed)
blood pressure control important in reducing the progression of atherosclerotic disease. On appropriate oral medications.  

## 2023-07-11 NOTE — Assessment & Plan Note (Signed)
ABIs are 0.55 on the right and 0.62 on the left with diminished waveforms.  The patient has disabling claudication symptoms worse on the right than the.  She has symptoms now worrisome for early rest pain.  We discussed in some detail the pathophysiology and natural history of peripheral arterial disease.  She is on antiplatelet and statin agent already.  We discussed addition of Plavix and an exercise regimen.  She no longer smokes.  She is at a point where her symptoms are disabling and she desires to have engine.  We discussed angiography with possible concomitant intervention depending on the anatomy encountered at the time of the angiogram.  We discussed that generally we have to do 1 leg at a time and she prefers to have the right leg done first.  Risks and benefits of the procedure were discussed in detail and she and her husband are agreeable to proceeding.

## 2023-07-11 NOTE — Assessment & Plan Note (Signed)
lipid control important in reducing the progression of atherosclerotic disease. Continue statin therapy  

## 2023-07-17 ENCOUNTER — Telehealth (INDEPENDENT_AMBULATORY_CARE_PROVIDER_SITE_OTHER): Payer: Self-pay

## 2023-07-17 NOTE — Telephone Encounter (Signed)
Spoke with the patient and she is scheduled with Dr. Wyn Quaker for bilateral lower extremity angio's on 08/05/23 (right -12:30 pm arrival) and 08/02/23 (left - 11:00 am arrival) to the [facility]. Pre-procedure instructions were discussed and will be mailed. Patient stated she does not use Mychart.

## 2023-07-26 ENCOUNTER — Ambulatory Visit
Admission: RE | Admit: 2023-07-26 | Discharge: 2023-07-26 | Disposition: A | Discharge status: Home / Self Care | Payer: Self-pay | Attending: Vascular Surgery | Admitting: Vascular Surgery

## 2023-07-26 ENCOUNTER — Encounter
Admission: RE | Disposition: A | Discharge status: Home / Self Care | Payer: Self-pay | Source: Home / Self Care | Attending: Vascular Surgery

## 2023-07-26 ENCOUNTER — Other Ambulatory Visit: Payer: Self-pay

## 2023-07-26 DIAGNOSIS — E1169 Type 2 diabetes mellitus with other specified complication: Secondary | ICD-10-CM | POA: Insufficient documentation

## 2023-07-26 DIAGNOSIS — I70211 Atherosclerosis of native arteries of extremities with intermittent claudication, right leg: Secondary | ICD-10-CM | POA: Insufficient documentation

## 2023-07-26 DIAGNOSIS — E1151 Type 2 diabetes mellitus with diabetic peripheral angiopathy without gangrene: Secondary | ICD-10-CM | POA: Insufficient documentation

## 2023-07-26 DIAGNOSIS — Z79899 Other long term (current) drug therapy: Secondary | ICD-10-CM | POA: Insufficient documentation

## 2023-07-26 DIAGNOSIS — Z7982 Long term (current) use of aspirin: Secondary | ICD-10-CM | POA: Insufficient documentation

## 2023-07-26 DIAGNOSIS — Z87891 Personal history of nicotine dependence: Secondary | ICD-10-CM | POA: Insufficient documentation

## 2023-07-26 DIAGNOSIS — Z8249 Family history of ischemic heart disease and other diseases of the circulatory system: Secondary | ICD-10-CM | POA: Insufficient documentation

## 2023-07-26 DIAGNOSIS — Z7984 Long term (current) use of oral hypoglycemic drugs: Secondary | ICD-10-CM | POA: Insufficient documentation

## 2023-07-26 DIAGNOSIS — I10 Essential (primary) hypertension: Secondary | ICD-10-CM | POA: Insufficient documentation

## 2023-07-26 DIAGNOSIS — E785 Hyperlipidemia, unspecified: Secondary | ICD-10-CM | POA: Insufficient documentation

## 2023-07-26 DIAGNOSIS — I70213 Atherosclerosis of native arteries of extremities with intermittent claudication, bilateral legs: Secondary | ICD-10-CM

## 2023-07-26 DIAGNOSIS — I70219 Atherosclerosis of native arteries of extremities with intermittent claudication, unspecified extremity: Secondary | ICD-10-CM

## 2023-07-26 DIAGNOSIS — Z794 Long term (current) use of insulin: Secondary | ICD-10-CM | POA: Insufficient documentation

## 2023-07-26 DIAGNOSIS — E1142 Type 2 diabetes mellitus with diabetic polyneuropathy: Secondary | ICD-10-CM | POA: Insufficient documentation

## 2023-07-26 DIAGNOSIS — E1165 Type 2 diabetes mellitus with hyperglycemia: Secondary | ICD-10-CM | POA: Insufficient documentation

## 2023-07-26 DIAGNOSIS — Z833 Family history of diabetes mellitus: Secondary | ICD-10-CM | POA: Insufficient documentation

## 2023-07-26 DIAGNOSIS — I701 Atherosclerosis of renal artery: Secondary | ICD-10-CM

## 2023-07-26 HISTORY — PX: LOWER EXTREMITY ANGIOGRAPHY: CATH118251

## 2023-07-26 LAB — BUN: BUN: 12 mg/dL (ref 8–23)

## 2023-07-26 LAB — CREATININE, SERUM
Creatinine, Ser: 0.6 mg/dL (ref 0.44–1.00)
GFR, Estimated: >60 mL/min (ref >60)

## 2023-07-26 LAB — GLUCOSE, CAPILLARY: Glucose-Capillary: 277 mg/dL — ABNORMAL HIGH (ref 70–99)

## 2023-07-26 SURGERY — LOWER EXTREMITY ANGIOGRAPHY
Anesthesia: Moderate Sedation | Site: Leg Lower | Laterality: Right

## 2023-07-26 MED ORDER — MIDAZOLAM HCL 2 MG/2ML IJ SOLN
INTRAMUSCULAR | Status: DC | PRN
  Administered 2023-07-26 (×3): 1 mg via INTRAVENOUS
  Administered 2023-07-26: 2 mg via INTRAVENOUS

## 2023-07-26 MED ORDER — CEFAZOLIN SODIUM-DEXTROSE 2-4 GM/100ML-% IV SOLN
INTRAVENOUS | Status: AC
  Filled 2023-07-26: qty 100

## 2023-07-26 MED ORDER — SODIUM CHLORIDE 0.9 % IV SOLN: INTRAVENOUS | Status: DC

## 2023-07-26 MED ORDER — FENTANYL CITRATE (PF) 100 MCG/2ML IJ SOLN
INTRAMUSCULAR | Status: AC
  Filled 2023-07-26: qty 2

## 2023-07-26 MED ORDER — HEPARIN (PORCINE) IN NACL 1000-0.9 UT/500ML-% IV SOLN
INTRAVENOUS | Status: DC | PRN
  Administered 2023-07-26: 1000 mL

## 2023-07-26 MED ORDER — HEPARIN SODIUM (PORCINE) 1000 UNIT/ML IJ SOLN
INTRAMUSCULAR | Status: AC
  Filled 2023-07-26: qty 10

## 2023-07-26 MED ORDER — MIDAZOLAM HCL 5 MG/5ML IJ SOLN
INTRAMUSCULAR | Status: AC
  Filled 2023-07-26: qty 5

## 2023-07-26 MED ORDER — MIDAZOLAM HCL 2 MG/ML PO SYRP: 8.0000 mg | ORAL_SOLUTION | Freq: Once | ORAL | Status: DC | PRN

## 2023-07-26 MED ORDER — LIDOCAINE-EPINEPHRINE (PF) 1 %-1:200000 IJ SOLN
INTRAMUSCULAR | Status: DC | PRN
  Administered 2023-07-26: 10 mL

## 2023-07-26 MED ORDER — FENTANYL CITRATE (PF) 100 MCG/2ML IJ SOLN
INTRAMUSCULAR | Status: DC | PRN
  Administered 2023-07-26 (×2): 25 ug via INTRAVENOUS
  Administered 2023-07-26 (×2): 50 ug via INTRAVENOUS

## 2023-07-26 MED ORDER — FENTANYL CITRATE PF 50 MCG/ML IJ SOSY
PREFILLED_SYRINGE | INTRAMUSCULAR | Status: AC
  Filled 2023-07-26: qty 1

## 2023-07-26 MED ORDER — CLOPIDOGREL BISULFATE 75 MG PO TABS: 75.0000 mg | ORAL_TABLET | Freq: Every day | ORAL | Status: DC

## 2023-07-26 MED ORDER — DIPHENHYDRAMINE HCL 50 MG/ML IJ SOLN: 50.0000 mg | Freq: Once | INTRAMUSCULAR | Status: DC | PRN

## 2023-07-26 MED ORDER — ONDANSETRON HCL 4 MG/2ML IJ SOLN: 4.0000 mg | Freq: Four times a day (QID) | INTRAMUSCULAR | Status: DC | PRN

## 2023-07-26 MED ORDER — HYDRALAZINE HCL 20 MG/ML IJ SOLN: 5.0000 mg | INTRAMUSCULAR | Status: DC | PRN

## 2023-07-26 MED ORDER — CEFAZOLIN SODIUM-DEXTROSE 2-4 GM/100ML-% IV SOLN
2.0000 g | INTRAVENOUS | Status: AC
  Administered 2023-07-26: 2 g via INTRAVENOUS

## 2023-07-26 MED ORDER — FAMOTIDINE 20 MG PO TABS: 40.0000 mg | ORAL_TABLET | Freq: Once | ORAL | Status: DC | PRN

## 2023-07-26 MED ORDER — METHYLPREDNISOLONE SODIUM SUCC 125 MG IJ SOLR: 125.0000 mg | Freq: Once | INTRAMUSCULAR | Status: DC | PRN

## 2023-07-26 MED ORDER — HYDROMORPHONE HCL 1 MG/ML IJ SOLN: 1.0000 mg | Freq: Once | INTRAMUSCULAR | Status: DC | PRN

## 2023-07-26 MED ORDER — HEPARIN SODIUM (PORCINE) 1000 UNIT/ML IJ SOLN
INTRAMUSCULAR | Status: DC | PRN
  Administered 2023-07-26: 5000 [IU] via INTRAVENOUS

## 2023-07-26 MED ORDER — ACETAMINOPHEN 325 MG PO TABS: 650.0000 mg | ORAL_TABLET | ORAL | Status: DC | PRN

## 2023-07-26 MED ORDER — LABETALOL HCL 5 MG/ML IV SOLN: 10.0000 mg | INTRAVENOUS | Status: DC | PRN

## 2023-07-26 MED ORDER — SODIUM CHLORIDE 0.9 % IV SOLN: 250.0000 mL | INTRAVENOUS | Status: DC | PRN

## 2023-07-26 MED ORDER — IODIXANOL 320 MG/ML IV SOLN
INTRAVENOUS | Status: DC | PRN
  Administered 2023-07-26: 50 mL

## 2023-07-26 MED ORDER — SODIUM CHLORIDE 0.9% FLUSH: 3.0000 mL | Freq: Two times a day (BID) | INTRAVENOUS | Status: DC

## 2023-07-26 MED ORDER — SODIUM CHLORIDE 0.9% FLUSH: 3.0000 mL | INTRAVENOUS | Status: DC | PRN

## 2023-07-26 MED ORDER — CLOPIDOGREL BISULFATE 75 MG PO TABS: 75.0000 mg | ORAL_TABLET | Freq: Every day | ORAL | 11 refills | Status: DC

## 2023-07-26 SURGICAL SUPPLY — 24 items
BALLN ARMADA 3.0X100X150 (BALLOONS) ×1
BALLN LUTONIX 018 4X300X130 (BALLOONS) ×1
BALLN LUTONIX 018 5X150X130 (BALLOONS) ×1
BALLN LUTONIX 018 5X300X130 (BALLOONS) ×1
BALLOON ARMADA 3.0X100X150 (BALLOONS) IMPLANT
BALLOON LUTONIX 018 4X300X130 (BALLOONS) IMPLANT
BALLOON LUTONIX 018 5X150X130 (BALLOONS) IMPLANT
BALLOON LUTONIX 018 5X300X130 (BALLOONS) IMPLANT
CATH ANGIO 5F PIGTAIL 65CM (CATHETERS) IMPLANT
CATH ROTAREX 135 6FR (CATHETERS) IMPLANT
CATH VERT 5X100 (CATHETERS) IMPLANT
COVER PROBE ULTRASOUND 5X96 (MISCELLANEOUS) IMPLANT
DEVICE STARCLOSE SE CLOSURE (Vascular Products) IMPLANT
GLIDEWIRE ADV .035X260CM (WIRE) IMPLANT
KIT ENCORE 26 ADVANTAGE (KITS) IMPLANT
PACK ANGIOGRAPHY (CUSTOM PROCEDURE TRAY) ×2 IMPLANT
SHEATH ANL2 6FRX45 HC (SHEATH) IMPLANT
SHEATH BRITE TIP 5FRX11 (SHEATH) IMPLANT
STENT VIABAHN 6X150X120 (Permanent Stent) IMPLANT
STENT VIABAHN 6X250X120 (Permanent Stent) IMPLANT
SYR MEDRAD MARK 7 150ML (SYRINGE) IMPLANT
TUBING CONTRAST HIGH PRESS 72 (TUBING) IMPLANT
WIRE G V18X300CM (WIRE) IMPLANT
WIRE GUIDERIGHT .035X150 (WIRE) IMPLANT

## 2023-07-26 NOTE — Op Note (Signed)
[facility]  Percutaneous Study/Intervention Procedural Note   Date of Surgery: 07/26/2023  Surgeon(s):Unity Luepke    Assistants:none  Pre-operative Diagnosis: PAD with claudication BLE  Post-operative diagnosis:  Same  Procedure(s) Performed:             1.  Ultrasound guidance for vascular access left femoral artery             2.  Catheter placement into right common femoral artery from left femoral approach             3.  Aortogram and selective right lower extremity angiogram             4.  Percutaneous transluminal angioplasty of right anterior tibial artery with 3 mm diameter angioplasty balloon             5.  Atherectomy of the right SFA and popliteal arteries with the [place] Rex device  6.  Angioplasty of the right SFA and popliteal arteries with 4 mm diameter by 30 cm length Lutonix drug-coated angioplasty balloon  7.  Stent placement of the right SFA and above-knee popliteal artery with 6 mm diameter by 25 cm length and 6 mm diameter by 15 cm length Viabahn stent             8.  StarClose closure device left femoral artery  EBL: 10 cc  Contrast: 50 cc  Fluoro Time: 9.6 minutes  Moderate Conscious Sedation Time: approximately 48 minutes using 5 mg of Versed and 150.  mcg of Fentanyl              Indications:  Patient is a 62 y.o.female with disabling claudication symptoms in both lower extremities. The patient has noninvasive study showing significant reduction in ABI bilaterally. The patient is brought in for angiography for further evaluation and potential treatment. Risks and benefits are discussed and informed consent is obtained.   Procedure:  The patient was identified and appropriate procedural time out was performed.  The patient was then placed supine on the table and prepped and draped in the usual sterile fashion. Moderate conscious sedation was administered during a face to face encounter with the patient throughout the procedure with my  supervision of the RN administering medicines and monitoring the patient's vital signs, pulse oximetry, telemetry and mental status throughout from the start of the procedure until the patient was taken to the recovery room. Ultrasound was used to evaluate the left common femoral artery.  It was patent .  A digital ultrasound image was acquired.  A Seldinger needle was used to access the left common femoral artery under direct ultrasound guidance and a permanent image was performed.  A 0.035 J wire was advanced without resistance and a 5Fr sheath was placed.  Pigtail catheter was placed into the aorta and an AP aortogram was performed. This demonstrated mild right renal artery stenosis, what appeared to be >60% left renal artery stenosis. The aorta and iliac arteries did not appear to have high-grade stenosis. I then crossed the aortic bifurcation and advanced to the right femoral head. Selective right lower extremity angiogram was then performed. This demonstrated diffuse disease of the right SFA and above-knee popliteal artery with multiple areas of high-grade near occlusive stenosis.  The below-knee popliteal artery normalized and then the anterior tibial artery was the dominant runoff to the foot but had about a 90% stenosis at the origin and about a 70% stenosis about 5 to 6 cm beyond the origin.  Beyond this, it was patent.  The peroneal artery provided a second runoff vessel although it was quite small. It was felt that it was in the patient's best interest to proceed with intervention after these images to avoid a second procedure and a larger amount of contrast and fluoroscopy based off of the findings from the initial angiogram. The patient was systemically heparinized and a 6 [place] sheath was then placed over the [employer] wire. I then used a Kumpe catheter and the advantage wire to navigate through the SFA and popliteal disease and then get down into the below-knee popliteal artery where I  exchanged for a V18 wire.  I was able to cross the stenosis in the anterior tibial artery without difficulty with a V18 wire and parked this in the foot.  I then proceeded with atherectomy to the right SFA and popliteal arteries using the [place] Rex device.  Passes were made with some improvement in the flow lumen but still high-grade residual stenosis throughout much of the SFA and popliteal arteries.  I then proceeded with angioplasty first of the anterior tibial artery with a 3 mm diameter by 10 cm length angioplasty balloon in the proximal anterior tibial artery to address both stenoses.  I then used a 4 mm diameter by 30 cm length Lutonix drug-coated angioplasty balloon and inflated this twice in the right SFA and popliteal arteries.  Imaging following this showed less than 20% residual stenosis in the anterior tibial artery but multiple areas in the right SFA and above-knee popliteal arteries with greater than 50% residual stenosis.  I elected to stent the right SFA and popliteal arteries with a 6 mm diameter by 25 cm length Viabahn stent and then a 6 mm diameter by 15 cm length Viabahn stent.  These were postdilated with 5 mm balloons with excellent angiographic completion result and less than 10% residual stenosis. I elected to terminate the procedure. The sheath was removed and StarClose closure device was deployed in the left femoral artery with excellent hemostatic result. The patient was taken to the recovery room in stable condition having tolerated the procedure well.  Findings:               Aortogram:  This demonstrated mild right renal artery stenosis, what appeared to be >60% left renal artery stenosis. The aorta and iliac arteries did not appear to have high-grade stenosis.             Right lower Extremity:  This demonstrated diffuse disease of the right SFA and above-knee popliteal artery with multiple areas of high-grade near occlusive stenosis.  The below-knee popliteal artery normalized and  then the anterior tibial artery was the dominant runoff to the foot but had about a 90% stenosis at the origin and about a 70% stenosis about 5 to 6 cm beyond the origin.  Beyond this, it was patent.  The peroneal artery provided a second runoff vessel although it was quite small.    Disposition: Patient was taken to the recovery room in stable condition having tolerated the procedure well.  Complications: None  Festus Barren 07/26/2023 2:06 PM   This note was created with Dragon Medical transcription system. Any errors in dictation are purely unintentional.

## 2023-07-26 NOTE — Interval H&P Note (Signed)
History and Physical Interval Note:  07/26/2023 1:06 PM  Pam Patel  has presented today for surgery, with the diagnosis of RLE Angio   ASO w claudication.  The various methods of treatment have been discussed with the patient and family. After consideration of risks, benefits and other options for treatment, the patient has consented to  Procedure(s): Lower Extremity Angiography (Right) as a surgical intervention.  The patient's history has been reviewed, patient examined, no change in status, stable for surgery.  I have reviewed the patient's chart and labs.  Questions were answered to the patient's satisfaction.     Festus Barren

## 2023-07-27 ENCOUNTER — Encounter: Payer: Self-pay | Admitting: Vascular Surgery

## 2023-07-28 ENCOUNTER — Other Ambulatory Visit: Payer: Self-pay | Admitting: Family Medicine

## 2023-07-31 ENCOUNTER — Other Ambulatory Visit: Payer: Self-pay | Admitting: Family Medicine

## 2023-08-01 ENCOUNTER — Other Ambulatory Visit: Payer: Self-pay | Admitting: Family Medicine

## 2023-08-01 ENCOUNTER — Telehealth (INDEPENDENT_AMBULATORY_CARE_PROVIDER_SITE_OTHER): Payer: Self-pay

## 2023-08-01 NOTE — Telephone Encounter (Signed)
Patient called wanting to cancel her LLE angio with Dr. Wyn Quaker that is scheduled for 08/02/23. Per patient she does not feel well enough and will call back to reschedule.

## 2023-08-02 ENCOUNTER — Encounter: Admission: RE | Payer: Self-pay | Source: Home / Self Care

## 2023-08-02 ENCOUNTER — Ambulatory Visit: Admission: RE | Admit: 2023-08-02 | Payer: Self-pay | Source: Home / Self Care | Admitting: Vascular Surgery

## 2023-08-02 DIAGNOSIS — I70219 Atherosclerosis of native arteries of extremities with intermittent claudication, unspecified extremity: Secondary | ICD-10-CM

## 2023-08-02 SURGERY — LOWER EXTREMITY ANGIOGRAPHY
Anesthesia: Moderate Sedation | Site: Leg Lower | Laterality: Left

## 2023-08-09 ENCOUNTER — Other Ambulatory Visit: Payer: Self-pay | Admitting: Family Medicine

## 2023-08-10 ENCOUNTER — Other Ambulatory Visit: Payer: Self-pay | Admitting: Family Medicine

## 2023-08-10 DIAGNOSIS — Z1212 Encounter for screening for malignant neoplasm of rectum: Secondary | ICD-10-CM

## 2023-08-10 DIAGNOSIS — Z1211 Encounter for screening for malignant neoplasm of colon: Secondary | ICD-10-CM

## 2023-08-27 ENCOUNTER — Other Ambulatory Visit: Payer: Self-pay | Admitting: Family Medicine

## 2023-08-29 ENCOUNTER — Other Ambulatory Visit (INDEPENDENT_AMBULATORY_CARE_PROVIDER_SITE_OTHER): Payer: Self-pay | Admitting: Vascular Surgery

## 2023-08-29 DIAGNOSIS — I739 Peripheral vascular disease, unspecified: Secondary | ICD-10-CM

## 2023-08-31 ENCOUNTER — Ambulatory Visit (INDEPENDENT_AMBULATORY_CARE_PROVIDER_SITE_OTHER): Payer: Self-pay

## 2023-08-31 ENCOUNTER — Encounter (INDEPENDENT_AMBULATORY_CARE_PROVIDER_SITE_OTHER): Payer: Self-pay | Admitting: Nurse Practitioner

## 2023-08-31 ENCOUNTER — Ambulatory Visit (INDEPENDENT_AMBULATORY_CARE_PROVIDER_SITE_OTHER): Payer: Self-pay | Admitting: Nurse Practitioner

## 2023-08-31 VITALS — BP 151/98 | HR 104 | Resp 18 | Ht 66.0 in | Wt 193.2 lb

## 2023-08-31 DIAGNOSIS — I70213 Atherosclerosis of native arteries of extremities with intermittent claudication, bilateral legs: Secondary | ICD-10-CM

## 2023-08-31 DIAGNOSIS — Z9889 Other specified postprocedural states: Secondary | ICD-10-CM

## 2023-08-31 DIAGNOSIS — E785 Hyperlipidemia, unspecified: Secondary | ICD-10-CM

## 2023-08-31 DIAGNOSIS — I10 Essential (primary) hypertension: Secondary | ICD-10-CM

## 2023-08-31 DIAGNOSIS — E1169 Type 2 diabetes mellitus with other specified complication: Secondary | ICD-10-CM

## 2023-08-31 DIAGNOSIS — I739 Peripheral vascular disease, unspecified: Secondary | ICD-10-CM

## 2023-08-31 DIAGNOSIS — E1142 Type 2 diabetes mellitus with diabetic polyneuropathy: Secondary | ICD-10-CM

## 2023-09-02 NOTE — Progress Notes (Signed)
Subjective:    Patient ID: Pam Patel, female    DOB: [birth year], 62 y.o.   MRN: [id number] Chief Complaint  Patient presents with   Follow-up    4 week follow up with ABI s    The patient returns to the office for followup and review status post angiogram with intervention on 07/26/2023.   Procedure: Procedure(s) Performed:             1.  Ultrasound guidance for vascular access left femoral artery             2.  Catheter placement into right common femoral artery from left femoral approach             3.  Aortogram and selective right lower extremity angiogram             4.  Percutaneous transluminal angioplasty of right anterior tibial artery with 3 mm diameter angioplasty balloon             5.  Atherectomy of the right SFA and popliteal arteries with the [place] Rex device             6.  Angioplasty of the right SFA and popliteal arteries with 4 mm diameter by 30 cm length Lutonix drug-coated angioplasty balloon             7.  Stent placement of the right SFA and above-knee popliteal artery with 6 mm diameter by 25 cm length and 6 mm diameter by 15 cm length Viabahn stent             8.  StarClose closure device left femoral artery   The patient notes improvement in the lower extremity symptoms. No interval shortening of the patient's claudication distance or rest pain symptoms. No new ulcers or wounds have occurred since the last visit.  She has had some postoperative soreness but that has been decreasing every so often.  She still continues to have claudication symptoms in her left lower extremity.  She was scheduled to have her left lower extremity done but elected to hold off given the pain and discomfort she was having post the right.  There have been no significant changes to the patient's overall health care.  No documented history of amaurosis fugax or recent TIA symptoms. There are no recent neurological changes noted. No documented history of DVT, PE or superficial  thrombophlebitis. The patient denies recent episodes of angina or shortness of breath.   ABI's Rt=1.07 and Lt=0.88  (previous ABI's Rt=0.55 and Lt=0.62) Duplex US of the right lower extremity shows triphasic waveforms with normal toe waveforms.    Review of Systems  Cardiovascular:  Negative for leg swelling.  All other systems reviewed and are negative.      Objective:   Physical Exam Vitals reviewed.  HENT:     Head: Normocephalic.  Cardiovascular:     Rate and Rhythm: Normal rate.     Pulses:          Dorsalis pedis pulses are 2+ on the right side and detected w/ Doppler on the left side.       Posterior tibial pulses are 2+ on the right side and detected w/ Doppler on the left side.  Pulmonary:     Effort: Pulmonary effort is normal.  Musculoskeletal:        General: Tenderness present.  Skin:    General: Skin is dry.  Neurological:     Mental Status: She is  alert and oriented to person, place, and time.  Psychiatric:        Mood and Affect: Mood normal.        Behavior: Behavior normal.        Thought Content: Thought content normal.        Judgment: Judgment normal.     BP (!) 151/98 (BP Location: Left Arm)   Pulse (!) 104   Resp 18   Ht 5\' 6"  (1.676 m)   Wt 193 lb 3.2 oz (87.6 kg)   BMI 31.18 kg/m   Past Medical History:  Diagnosis Date   History of chickenpox    History of diabetes mellitus, type II    History of hypertension    Hypertension     Social History   Socioeconomic History   Marital status: Married    Spouse name: Not on file   Number of children: Not on file   Years of education: Not on file   Highest education level: Not on file  Occupational History   Not on file  Tobacco Use   Smoking status: Former   Smokeless tobacco: Never  Substance and Sexual Activity   Alcohol use: Not Currently   Drug use: Never   Sexual activity: Not on file  Other Topics Concern   Not on file  Social History Narrative   Not on file   Social  Determinants of Health   Financial Resource Strain: Not on file  Food Insecurity: Not on file  Transportation Needs: Not on file  Physical Activity: Not on file  Stress: Not on file  Social Connections: Not on file  Intimate Partner Violence: Not on file    Past Surgical History:  Procedure Laterality Date   APPENDECTOMY  1990   BACK SURGERY     LOWER EXTREMITY ANGIOGRAPHY Right 07/26/2023   Procedure: Lower Extremity Angiography;  Surgeon: Annice Needy, MD;  Location: [facility];  Service: Cardiovascular;  Laterality: Right;    Family History  Problem Relation Age of Onset   COPD Mother    Diabetes Mother    Heart attack Mother    Heart disease Mother    Hypertension Mother    Stroke Mother    Heart attack Father     Allergies  Allergen Reactions   Penicillins Hives    Has patient had a PCN reaction causing immediate rash, facial/tongue/throat swelling, SOB or lightheadedness with hypotension: Unknown Has patient had a PCN reaction causing severe rash involving mucus membranes or skin necrosis: No Has patient had a PCN reaction that required hospitalization: No Has patient had a PCN reaction occurring within the last 10 years: No If all of the above answers are "NO", then may proceed with Cephalosporin use.    Naproxen     REACTION: hives       Latest Ref Rng & Units 03/26/2023   12:49 PM 12/19/2021    2:35 PM 08/06/2020   11:56 AM  CBC  WBC 4.0 - 10.5 K/uL 8.9  11.2  11.4   Hemoglobin 12.0 - 15.0 g/dL 19.1  47.8  29.5   Hematocrit 36.0 - 46.0 % 43.8  43.3  45.4   Platelets 150.0 - 400.0 K/uL 287.0  280.0  293.0       CMP     Component Value Date/Time   NA 132 (L) 05/08/2023 1206   K 4.5 05/08/2023 1206   CL 99 05/08/2023 1206   CO2 25 05/08/2023 1206   GLUCOSE  330 (H) 05/08/2023 1206   BUN 12 07/26/2023 1226   CREATININE 0.60 07/26/2023 1226   CALCIUM 10.9 (H) 05/08/2023 1206   PROT 6.7 03/26/2023 1249   ALBUMIN 3.8 03/26/2023 1249   AST  37 03/26/2023 1249   ALT 31 03/26/2023 1249   ALKPHOS 96 03/26/2023 1249   BILITOT 0.6 03/26/2023 1249   GFR 80.85 05/08/2023 1206   GFRNONAA >60 07/26/2023 1226     No results found.     Assessment & Plan:   1. Atherosclerosis of native artery of both lower extremities with intermittent claudication (HCC) Recommend:  The patient is status post successful angiogram with intervention.  The patient reports that the claudication symptoms and leg pain has improved.   The patient denies lifestyle limiting changes at this point in time.  The patient still wishes to have her left lower extremity treated.  She will contact our office once she is ready to have it  2. Primary hypertension Continue antihypertensive medications as already ordered, these medications have been reviewed and there are no changes at this time.  3. DM type 2 with diabetic peripheral neuropathy (HCC) Continue hypoglycemic medications as already ordered, these medications have been reviewed and there are no changes at this time.  Hgb A1C to be monitored as already arranged by primary service  4. Hyperlipidemia associated with type 2 diabetes mellitus (HCC) Continue statin as ordered and reviewed, no changes at this time   Current Outpatient Medications on File Prior to Visit  Medication Sig Dispense Refill   amLODipine (NORVASC) 5 MG tablet TAKE 1 TABLET (5 MG TOTAL) BY MOUTH DAILY. 90 tablet 2   aspirin EC 81 MG tablet Take 81 mg by mouth daily.      blood glucose meter kit and supplies KIT Dispense based on patient and insurance preference. Use up to four times daily as directed. (FOR ICD-9 250.00, 250.01). 1 each 0   clopidogrel (PLAVIX) 75 MG tablet Take 1 tablet (75 mg total) by mouth daily. 30 tablet 11   glipiZIDE (GLUCOTROL XL) 10 MG 24 hr tablet Take 1 tablet (10 mg total) by mouth daily with breakfast. 90 tablet 1   insulin glargine (LANTUS) 100 UNIT/ML injection Inject 0.3 mLs (30 Units total) into the  skin daily. (Patient taking differently: Inject 40 Units into the skin daily.) 20 mL 11   insulin glargine-yfgn (SEMGLEE) 100 UNIT/ML injection Inject into the skin daily.     Insulin Syringe-Needle U-100 (INSULIN SYRINGE .5CC/28G) 28G X 1/2" 0.5 ML MISC Use with insulin once daily 100 each 1   levothyroxine (SYNTHROID) 75 MCG tablet TAKE 1 TABLET BY MOUTH EVERY DAY BEFORE BREAKFAST 90 tablet 1   lisinopril (ZESTRIL) 40 MG tablet Take 1 tablet (40 mg total) by mouth daily. 90 tablet 1   metFORMIN (GLUCOPHAGE-XR) 500 MG 24 hr tablet TAKE 2 PILLS BY MOUTH EVERY MORNING AND 1 PILL EVERY EVENING 270 tablet 0   metoprolol tartrate (LOPRESSOR) 50 MG tablet TAKE 1 TABLET BY MOUTH TWICE A DAY 180 tablet 1   PARoxetine (PAXIL) 20 MG tablet TAKE 1 TABLET BY MOUTH EVERY DAY 90 tablet 0   rosuvastatin (CRESTOR) 10 MG tablet TAKE 1 TABLET BY MOUTH EVERY DAY 90 tablet 2   No current facility-administered medications on file prior to visit.    There are no Patient Instructions on file for this visit. No follow-ups on file.   Georgiana Spinner, NP

## 2023-09-04 LAB — VAS US ABI WITH/WO TBI
Left ABI: 0.88
Right ABI: 1.07

## 2023-09-26 NOTE — Progress Notes (Deleted)
Subjective:    Patient ID: Pam Patel, female    DOB: April 09, 1961, 62 y.o.   MRN: [id number]  HPI  Wt Readings from Last 3 Encounters:  08/31/23 193 lb 3.2 oz (87.6 kg)  07/26/23 195 lb 12.8 oz (88.8 kg)  07/10/23 191 lb (86.6 kg)      There were no vitals filed for this visit.  Pt presents for follow up of chronic health problems including DM2 and HTN, vascular dz and hyperlipidemia   HTN bp is stable today  No cp or palpitations or headaches or edema  No side effects to medicines  BP Readings from Last 3 Encounters:  08/31/23 (!) 151/98  07/26/23 125/69  07/10/23 (!) 184/101   Amlodipine 5 mg daily  Lisinopril 40 mg daily  Metoprolol 50 mg bid   Lab Results  Component Value Date   NA 132 (L) 05/08/2023   K 4.5 05/08/2023   CO2 25 05/08/2023   GLUCOSE 330 (H) 05/08/2023   BUN 12 07/26/2023   CREATININE 0.60 07/26/2023   CALCIUM 10.9 (H) 05/08/2023   GFR 80.85 05/08/2023   GFRNONAA >60 07/26/2023   Hyponatremia     DM2 Lab Results  Component Value Date   HGBA1C 11.7 (A) 06/26/2023  Prior to this 13.3  Metformin 1000 mg bid Glipizide xl 10 mg daily  Lantus was increase to 40 u daily  At that time had new endo appointment in sept to est   Water exercise  No results found for: "LABMICR", "MICROALBUR"   Hyperlipidemia Lab Results  Component Value Date   CHOL 136 05/08/2023   HDL 32.00 (L) 05/08/2023   LDLCALC 86 12/02/2019   LDLDIRECT 72.0 05/08/2023   TRIG 206.0 (H) 05/08/2023   CHOLHDL 4 05/08/2023      Hypothyroidism  Pt has no clinical changes No change in energy level/ hair or skin/ edema and no tremor Lab Results  Component Value Date   TSH 2.86 03/26/2023      PVD Seeing vascular surgeon  Had angiogram with intervention in right Planning left  On plavix     Patient Active Problem List   Diagnosis Date Noted   Hyponatremia 06/26/2023   Does not have health insurance 05/08/2023   Atherosclerosis of native arteries of  extremity with intermittent claudication (HCC) 03/26/2023   Poor compliance 03/26/2023   Obesity (BMI 30.0-34.9) 08/08/2020   Hot flashes 08/28/2018   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   Dental disease 03/07/2018   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   Lung nodule 03/07/2018   Hypertension 03/06/2018   Panic disorder 03/06/2018   Thyromegaly 03/06/2018   DM type 2 with diabetic peripheral neuropathy (HCC) 03/06/2018   Hypothyroidism 10/05/2008   STRESS REACTION, ACUTE, WITH EMOTIONAL DISTURBANCE 10/05/2008   Hyperlipidemia associated with type 2 diabetes mellitus (HCC) 01/15/2008   Former smoker 01/15/2008   ALLERGIC RHINITIS, SEASONAL 01/15/2008   DEGENERATIVE DISC DISEASE, 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   Past Medical History:  Diagnosis Date   History of chickenpox    History of diabetes mellitus, type II    History of hypertension    Hypertension    Past Surgical History:  Procedure Laterality Date   APPENDECTOMY  1990   BACK SURGERY     LOWER EXTREMITY ANGIOGRAPHY Right 07/26/2023   Procedure: Lower Extremity Angiography;  Surgeon: Annice Needy, MD;  Location: [facility] INVASIVE CV LAB;  Service: Cardiovascular;  Laterality: Right;   Social History   Tobacco Use   Smoking status: Former  Smokeless tobacco: Never  Substance Use Topics   Alcohol use: Not Currently   Drug use: Never   Family History  Problem Relation Age of Onset   COPD Mother    Diabetes Mother    Heart attack Mother    Heart disease Mother    Hypertension Mother    Stroke Mother    Heart attack Father    Allergies  Allergen Reactions   Penicillins Hives    Has patient had a PCN reaction causing immediate rash, facial/tongue/throat swelling, SOB or lightheadedness with hypotension: Unknown Has patient had a PCN reaction causing severe rash involving mucus membranes or skin necrosis: No Has patient had a PCN reaction that required hospitalization: No Has patient had a PCN reaction occurring within the last  10 years: No If all of the above answers are "NO", then may proceed with Cephalosporin use.    Naproxen     REACTION: hives   Current Outpatient Medications on File Prior to Visit  Medication Sig Dispense Refill   amLODipine (NORVASC) 5 MG tablet TAKE 1 TABLET (5 MG TOTAL) BY MOUTH DAILY. 90 tablet 2   aspirin EC 81 MG tablet Take 81 mg by mouth daily.      blood glucose meter kit and supplies KIT Dispense based on patient and insurance preference. Use up to four times daily as directed. (FOR ICD-9 250.00, 250.01). 1 each 0   clopidogrel (PLAVIX) 75 MG tablet Take 1 tablet (75 mg total) by mouth daily. 30 tablet 11   glipiZIDE (GLUCOTROL XL) 10 MG 24 hr tablet Take 1 tablet (10 mg total) by mouth daily with breakfast. 90 tablet 1   insulin glargine (LANTUS) 100 UNIT/ML injection Inject 0.3 mLs (30 Units total) into the skin daily. (Patient taking differently: Inject 40 Units into the skin daily.) 20 mL 11   insulin glargine-yfgn (SEMGLEE) 100 UNIT/ML injection Inject into the skin daily.     Insulin Syringe-Needle U-100 (INSULIN SYRINGE .5CC/28G) 28G X 1/2" 0.5 ML MISC Use with insulin once daily 100 each 1   levothyroxine (SYNTHROID) 75 MCG tablet TAKE 1 TABLET BY MOUTH EVERY DAY BEFORE BREAKFAST 90 tablet 1   lisinopril (ZESTRIL) 40 MG tablet Take 1 tablet (40 mg total) by mouth daily. 90 tablet 1   metFORMIN (GLUCOPHAGE-XR) 500 MG 24 hr tablet TAKE 2 PILLS BY MOUTH EVERY MORNING AND 1 PILL EVERY EVENING 270 tablet 0   metoprolol tartrate (LOPRESSOR) 50 MG tablet TAKE 1 TABLET BY MOUTH TWICE A DAY 180 tablet 1   PARoxetine (PAXIL) 20 MG tablet TAKE 1 TABLET BY MOUTH EVERY DAY 90 tablet 0   rosuvastatin (CRESTOR) 10 MG tablet TAKE 1 TABLET BY MOUTH EVERY DAY 90 tablet 2   No current facility-administered medications on file prior to visit.    Review of Systems     Objective:   Physical Exam        Assessment & Plan:   Problem List Items Addressed This Visit       Endocrine    Hypothyroidism   Hyperlipidemia associated with type 2 diabetes mellitus (HCC)   Other Visit Diagnoses     Type 2 diabetes mellitus with diabetic neuropathy, without long-term current use of insulin (HCC)    -  Primary

## 2023-09-27 ENCOUNTER — Ambulatory Visit: Payer: Self-pay | Admitting: Family Medicine

## 2023-09-27 DIAGNOSIS — E1169 Type 2 diabetes mellitus with other specified complication: Secondary | ICD-10-CM

## 2023-09-27 DIAGNOSIS — E1142 Type 2 diabetes mellitus with diabetic polyneuropathy: Secondary | ICD-10-CM

## 2023-09-27 DIAGNOSIS — E66811 Obesity, class 1: Secondary | ICD-10-CM

## 2023-09-27 DIAGNOSIS — E1140 Type 2 diabetes mellitus with diabetic neuropathy, unspecified: Secondary | ICD-10-CM

## 2023-09-27 DIAGNOSIS — I70213 Atherosclerosis of native arteries of extremities with intermittent claudication, bilateral legs: Secondary | ICD-10-CM

## 2023-09-27 DIAGNOSIS — E871 Hypo-osmolality and hyponatremia: Secondary | ICD-10-CM

## 2023-09-27 DIAGNOSIS — E039 Hypothyroidism, unspecified: Secondary | ICD-10-CM

## 2023-09-27 DIAGNOSIS — I10 Essential (primary) hypertension: Secondary | ICD-10-CM

## 2023-10-01 NOTE — Progress Notes (Unsigned)
Subjective:    Patient ID: Pam Patel, female    DOB: 05-02-1961, 62 y.o.   MRN: [id number]  HPI  Wt Readings from Last 3 Encounters:  10/02/23 196 lb 4 oz (89 kg)  08/31/23 193 lb 3.2 oz (87.6 kg)  07/26/23 195 lb 12.8 oz (88.8 kg)   31.68 kg/m  Vitals:   10/02/23 1355  BP: 126/64  Pulse: 79  Temp: 98.3 F (36.8 C)  SpO2: 96%    Pt presents for follow up of dm2 and chronic health problems   HTN bp is stable today  No cp or palpitations or headaches or edema  No side effects to medicines  BP Readings from Last 3 Encounters:  10/02/23 126/64  08/31/23 (!) 151/98  07/26/23 125/69    Amlodipine 5 mg daily  Lisinopril 40 mg daily  Metoprolol 50 mg bid   Lab Results  Component Value Date   NA 132 (L) 05/08/2023   K 4.5 05/08/2023   CO2 25 05/08/2023   GLUCOSE 330 (H) 05/08/2023   BUN 12 07/26/2023   CREATININE 0.60 07/26/2023   CALCIUM 10.9 (H) 05/08/2023   GFR 80.85 05/08/2023   GFRNONAA >60 07/26/2023   Hyponatremia at last check    Dm 2  Lab Results  Component Value Date   HGBA1C 10.2 (A) 10/02/2023   Metformin xr 1000 mg bid  Glipizide xl 10 mg daily  Lantus was increase to 40 u daily (this did help some  Was ref to new endocrinologist - decided not to go   Lab Results  Component Value Date   HGBA1C 10.2 (A) 10/02/2023   Not able to exercise as much due to legs   Not eating correctly  She takes full responsibility for that  Did stop fast foods   Her MIL is knowledgeable about diabetes and very helpful   Weakness is snacking at night  Some fruit  Some mixed nuts Slim jims   Is an emotional eater Stress makes her eat wrong    Glucose usually 200s to 300s   Saw dermatology and had many spots cryo treatment on face  Age spots Not cancers     Hyperlipidemia Lab Results  Component Value Date   CHOL 136 05/08/2023   HDL 32.00 (L) 05/08/2023   LDLCALC 86 12/02/2019   LDLDIRECT 72.0 05/08/2023   TRIG 206.0 (H) 05/08/2023    CHOLHDL 4 05/08/2023  Crestor 10 mg dialy  No side effects    With PAD Re vascularization since last visit here for one leg  Arthrectomy and stenting  It was rough on her  Had a lot of nausea after procedure   Waiting to recover to do the other leg -planned for January   Taking plavix 75 mg daily for 3 months after each procedure     Patient Active Problem List   Diagnosis Date Noted   PAD (peripheral artery disease) (HCC) 10/02/2023   Hyponatremia 06/26/2023   Does not have health insurance 05/08/2023   Atherosclerosis of native arteries of extremity with intermittent claudication (HCC) 03/26/2023   Poor compliance 03/26/2023   Obesity (BMI 30.0-34.9) 08/08/2020   Hot flashes 08/28/2018   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   Dental disease 03/07/2018   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   Lung nodule 03/07/2018   Hypertension 03/06/2018   Panic disorder 03/06/2018   Thyromegaly 03/06/2018   DM type 2 with diabetic peripheral neuropathy (HCC) 03/06/2018   Hypothyroidism 10/05/2008   STRESS REACTION, ACUTE, WITH EMOTIONAL  DISTURBANCE 10/05/2008   Hyperlipidemia associated with type 2 diabetes mellitus (HCC) 01/15/2008   Former smoker 01/15/2008   ALLERGIC RHINITIS, SEASONAL 01/15/2008   DEGENERATIVE DISC DISEASE, 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   Past Medical History:  Diagnosis Date   History of chickenpox    History of diabetes mellitus, type II    History of hypertension    Hypertension    Past Surgical History:  Procedure Laterality Date   APPENDECTOMY  1990   BACK SURGERY     LOWER EXTREMITY ANGIOGRAPHY Right 07/26/2023   Procedure: Lower Extremity Angiography;  Surgeon: Annice Needy, MD;  Location: [facility] INVASIVE CV LAB;  Service: Cardiovascular;  Laterality: Right;   Social History   Tobacco Use   Smoking status: Former   Smokeless tobacco: Never  Substance Use Topics   Alcohol use: Not Currently   Drug use: Never   Family History  Problem Relation Age of Onset    COPD Mother    Diabetes Mother    Heart attack Mother    Heart disease Mother    Hypertension Mother    Stroke Mother    Heart attack Father    Allergies  Allergen Reactions   Penicillins Hives    Has patient had a PCN reaction causing immediate rash, facial/tongue/throat swelling, SOB or lightheadedness with hypotension: Unknown Has patient had a PCN reaction causing severe rash involving mucus membranes or skin necrosis: No Has patient had a PCN reaction that required hospitalization: No Has patient had a PCN reaction occurring within the last 10 years: No If all of the above answers are "NO", then may proceed with Cephalosporin use.    Naproxen     REACTION: hives   Current Outpatient Medications on File Prior to Visit  Medication Sig Dispense Refill   amLODipine (NORVASC) 5 MG tablet TAKE 1 TABLET (5 MG TOTAL) BY MOUTH DAILY. 90 tablet 2   aspirin EC 81 MG tablet Take 81 mg by mouth daily.      blood glucose meter kit and supplies KIT Dispense based on patient and insurance preference. Use up to four times daily as directed. (FOR ICD-9 250.00, 250.01). 1 each 0   clopidogrel (PLAVIX) 75 MG tablet Take 1 tablet (75 mg total) by mouth daily. 30 tablet 11   glipiZIDE (GLUCOTROL XL) 10 MG 24 hr tablet Take 1 tablet (10 mg total) by mouth daily with breakfast. 90 tablet 1   insulin glargine-yfgn (SEMGLEE) 100 UNIT/ML injection Inject into the skin daily.     Insulin Syringe-Needle U-100 (INSULIN SYRINGE .5CC/28G) 28G X 1/2" 0.5 ML MISC Use with insulin once daily 100 each 1   levothyroxine (SYNTHROID) 75 MCG tablet TAKE 1 TABLET BY MOUTH EVERY DAY BEFORE BREAKFAST 90 tablet 1   lisinopril (ZESTRIL) 40 MG tablet Take 1 tablet (40 mg total) by mouth daily. 90 tablet 1   metFORMIN (GLUCOPHAGE-XR) 500 MG 24 hr tablet TAKE 2 PILLS BY MOUTH EVERY MORNING AND 1 PILL EVERY EVENING 270 tablet 0   metoprolol tartrate (LOPRESSOR) 50 MG tablet TAKE 1 TABLET BY MOUTH TWICE A DAY 180 tablet 1    PARoxetine (PAXIL) 20 MG tablet TAKE 1 TABLET BY MOUTH EVERY DAY 90 tablet 0   rosuvastatin (CRESTOR) 10 MG tablet TAKE 1 TABLET BY MOUTH EVERY DAY 90 tablet 2   No current facility-administered medications on file prior to visit.    Review of Systems  Constitutional:  Positive for fatigue. Negative for activity change, appetite change, fever and unexpected  weight change.  HENT:  Negative for congestion, ear pain, rhinorrhea, sinus pressure and sore throat.   Eyes:  Negative for pain, redness and visual disturbance.  Respiratory:  Negative for cough, shortness of breath and wheezing.   Cardiovascular:  Negative for chest pain and palpitations.  Gastrointestinal:  Negative for abdominal pain, blood in stool, constipation and diarrhea.  Endocrine: Negative for polydipsia and polyuria.  Genitourinary:  Negative for dysuria, frequency and urgency.  Musculoskeletal:  Negative for arthralgias, back pain and myalgias.       Leg pain is some improved   Skin:  Negative for pallor and rash.  Allergic/Immunologic: Negative for environmental allergies.  Neurological:  Negative for dizziness, syncope and headaches.  Hematological:  Negative for adenopathy. Does not bruise/bleed easily.  Psychiatric/Behavioral:  Negative for decreased concentration and dysphoric mood. The patient is not nervous/anxious.        Objective:   Physical Exam Constitutional:      General: She is not in acute distress.    Appearance: Normal appearance. She is well-developed. She is obese. She is not ill-appearing or diaphoretic.  HENT:     Head: Normocephalic and atraumatic.  Eyes:     Conjunctiva/sclera: Conjunctivae normal.     Pupils: Pupils are equal, round, and reactive to light.  Neck:     Thyroid: No thyromegaly.     Vascular: No carotid bruit or JVD.  Cardiovascular:     Rate and Rhythm: Normal rate and regular rhythm.     Heart sounds: Normal heart sounds.     No gallop.     Comments: Pedal pulse is  stronger in right foot  Pulmonary:     Effort: Pulmonary effort is normal. No respiratory distress.     Breath sounds: Normal breath sounds. No wheezing or rales.  Abdominal:     General: There is no distension or abdominal bruit.     Palpations: Abdomen is soft.  Musculoskeletal:     Cervical back: Normal range of motion and neck supple.     Right lower leg: No edema.     Left lower leg: No edema.  Lymphadenopathy:     Cervical: No cervical adenopathy.  Skin:    General: Skin is warm and dry.     Coloration: Skin is not pale.     Findings: No rash.     Comments: Skin changes from recent cryotx on face noted   Neurological:     Mental Status: She is alert.     Coordination: Coordination normal.     Gait: Gait normal.     Deep Tendon Reflexes: Reflexes are normal and symmetric. Reflexes normal.  Psychiatric:        Attention and Perception: Attention normal.        Mood and Affect: Mood is anxious.     Comments: Seems tired and stressed today  Candidly discusses symptoms and stressors             Assessment & Plan:   Problem List Items Addressed This Visit       Cardiovascular and Mediastinum   PAD (peripheral artery disease) (HCC)    Pt has had re vascularization of RLE since last and will be getting other leg treated in January  Overall doing better but the experience was hard on her  Will take plavix for 3 mo following each procedure   At next visit may consider increase crestor dose if affordable to get LDL lower Needs better dm control as  well   Also discussed heart screening, she is open to getting out of pocket coronary ca score and that was ordered       Relevant Orders   CT CARDIAC SCORING (SELF PAY ONLY)   Hypertension    bp in fair control at this time  BP Readings from Last 1 Encounters:  10/02/23 126/64   No changes needed Most recent labs reviewed  Disc lifstyle change with low sodium diet and exercise  No changes  Plan to continue   Amlodipine 5 mg daily  Lisinopril 40 mg daily  Metoprolol 50 mg bid       Relevant Orders   CT CARDIAC SCORING (SELF PAY ONLY)     Endocrine   Hyperlipidemia associated with type 2 diabetes mellitus (HCC)    Disc goals for lipids and reasons to control them Rev last labs with pt Rev low sat fat diet in detail Last LDL of 72 Goal of 70 or lower  Tolerates crestor 10 mg daily  May need to increase at next visit   PAD being treated  Cardiac ca score test ordered as well      Relevant Medications   insulin glargine (LANTUS) 100 UNIT/ML injection   Other Relevant Orders   CT CARDIAC SCORING (SELF PAY ONLY)   AMB Referral [facility] Management   DM type 2 with diabetic peripheral neuropathy (HCC) - Primary    Lab Results  Component Value Date   HGBA1C 10.2 (A) 10/02/2023   Improved/not at goal Declines endocrinology referral unfortunately (cancelled the one she had)  Have not tried GLP-1 due to cost and no ins  Open to referral for pharm care - will do that , perhaps med assistance could be found?  Will continue  Metformin xr 1000 mg daily  Glipizide xl 10 mg daily  Lantus- go up to 50 u daily  Continue charting glucose  ? If would qualify for glp-1 or other if pt assistance   Neuropathy is worse/ need to work on better glucose control Diet is problematic Looking at a dm meal service  Also given # for [facility] to consider some mental health counseling to help the emotional eating  Microalb ordered today       Relevant Medications   insulin glargine (LANTUS) 100 UNIT/ML injection   Other Relevant Orders   POCT HgB A1C (Completed)   Microalbumin / creatinine urine ratio   AMB Referral [facility] Care Management     Other   STRESS REACTION, ACUTE, WITH EMOTIONAL DISTURBANCE    Continues paxil 20 mg- helpful  Candid about emotional eating No insurance currently   Given # for [facility] to see if she could get some counseling

## 2023-10-02 ENCOUNTER — Encounter: Payer: Self-pay | Admitting: Family Medicine

## 2023-10-02 ENCOUNTER — Ambulatory Visit (INDEPENDENT_AMBULATORY_CARE_PROVIDER_SITE_OTHER): Payer: Self-pay | Admitting: Family Medicine

## 2023-10-02 VITALS — BP 126/64 | HR 79 | Temp 98.3°F | Ht 66.0 in | Wt 196.2 lb

## 2023-10-02 DIAGNOSIS — E1169 Type 2 diabetes mellitus with other specified complication: Secondary | ICD-10-CM

## 2023-10-02 DIAGNOSIS — Z794 Long term (current) use of insulin: Secondary | ICD-10-CM

## 2023-10-02 DIAGNOSIS — E785 Hyperlipidemia, unspecified: Secondary | ICD-10-CM

## 2023-10-02 DIAGNOSIS — Z7984 Long term (current) use of oral hypoglycemic drugs: Secondary | ICD-10-CM

## 2023-10-02 DIAGNOSIS — E1142 Type 2 diabetes mellitus with diabetic polyneuropathy: Secondary | ICD-10-CM

## 2023-10-02 DIAGNOSIS — I739 Peripheral vascular disease, unspecified: Secondary | ICD-10-CM

## 2023-10-02 DIAGNOSIS — I10 Essential (primary) hypertension: Secondary | ICD-10-CM

## 2023-10-02 DIAGNOSIS — R4589 Other symptoms and signs involving emotional state: Secondary | ICD-10-CM

## 2023-10-02 LAB — POCT GLYCOSYLATED HEMOGLOBIN (HGB A1C): Hemoglobin A1C: 10.2 % — AB (ref 4.0–5.6)

## 2023-10-02 MED ORDER — INSULIN GLARGINE 100 UNIT/ML ~~LOC~~ SOLN: 50.0000 [IU] | Freq: Every day | SUBCUTANEOUS | 2 refills | Status: DC

## 2023-10-02 NOTE — Assessment & Plan Note (Signed)
Lab Results  Component Value Date   HGBA1C 10.2 (A) 10/02/2023   Improved/not at goal Declines endocrinology referral unfortunately (cancelled the one she had)  Have not tried GLP-1 due to cost and no ins  Open to referral for pharm care - will do that , perhaps med assistance could be found?  Will continue  Metformin xr 1000 mg daily  Glipizide xl 10 mg daily  Lantus- go up to 50 u daily  Continue charting glucose  ? If would qualify for glp-1 or other if pt assistance   Neuropathy is worse/ need to work on better glucose control Diet is problematic Looking at a dm meal service  Also given # for [facility] to consider some mental health counseling to help the emotional eating  Microalb ordered today

## 2023-10-02 NOTE — Assessment & Plan Note (Signed)
Continues paxil 20 mg- helpful  Candid about emotional eating No insurance currently   Given # for [facility] to see if she could get some counseling

## 2023-10-02 NOTE — Patient Instructions (Addendum)
Please work on diabetic diet   Try to get most of your carbohydrates from produce (with the exception of white potatoes) and whole grains Eat less bread/pasta/rice/snack foods/cereals/sweets and other items from the middle of the grocery store (processed carbs)  Avoid sugar drinks  Avoid alcohol   I will place a pharmacy referral  If you get a call in 2 weeks let me know  For now go up on basal insulin for 50 units  If low readings let us know   I put the referral in for a coronary calcium score test  Please let us know if you don't hear in 1-2 weeks   Follow up in 3 months or earlier if needed   Leave urine sample for microalbumin test today    For stress and emotional eating  A counselor may help   Consider calling [facility] center [address] [place], [place] [address]  [phone]  I

## 2023-10-02 NOTE — Assessment & Plan Note (Signed)
Pt has had re vascularization of RLE since last and will be getting other leg treated in January  Overall doing better but the experience was hard on her  Will take plavix for 3 mo following each procedure   At next visit may consider increase crestor dose if affordable to get LDL lower Needs better dm control as well   Also discussed heart screening, she is open to getting out of pocket coronary ca score and that was ordered

## 2023-10-02 NOTE — Assessment & Plan Note (Signed)
Disc goals for lipids and reasons to control them Rev last labs with pt Rev low sat fat diet in detail Last LDL of 72 Goal of 70 or lower  Tolerates crestor 10 mg daily  May need to increase at next visit   PAD being treated  Cardiac ca score test ordered as well

## 2023-10-02 NOTE — Assessment & Plan Note (Signed)
bp in fair control at this time  BP Readings from Last 1 Encounters:  10/02/23 126/64   No changes needed Most recent labs reviewed  Disc lifstyle change with low sodium diet and exercise  No changes  Plan to continue  Amlodipine 5 mg daily  Lisinopril 40 mg daily  Metoprolol 50 mg bid

## 2023-10-03 ENCOUNTER — Telehealth: Payer: Self-pay

## 2023-10-03 LAB — MICROALBUMIN / CREATININE URINE RATIO
Creatinine,U: 128.3 mg/dL
Microalb Creat Ratio: 11.2 mg/g (ref 0.0–30.0)
Microalb, Ur: 14.4 mg/dL — ABNORMAL HIGH (ref 0.0–1.9)

## 2023-10-03 NOTE — Progress Notes (Signed)
   Care Guide Note  10/03/2023 Name: [name] MRN: [id number] DOB: [birth year]  Referred by: Tower, Audrie Gallus, MD Reason for referral : Care Coordination (Outreach to schedule with pharm d )   Pam Patel is a 62 y.o. year old female who is a primary care patient of Tower, Audrie Gallus, MD. [name] was referred to the pharmacist for assistance related to HLD and DM.    Successful contact was made with the patient to discuss pharmacy services including being ready for the pharmacist to call at least 5 minutes before the scheduled appointment time, to have medication bottles and any blood sugar or blood pressure readings ready for review. The patient agreed to meet with the pharmacist via with the pharmacist via telephone visit on (date/time).  10/11/2023  Penne Lash , RMA     [place]  [facility], [facility] Guide  Direct Dial: [phone]  Website: [email or web address]

## 2023-10-03 NOTE — Progress Notes (Signed)
   Care Guide Note  10/03/2023 Name: Pam Patel MRN: [id number] DOB: June 26, 1961  Referred by: Tower, Audrie Gallus, MD Reason for referral : Care Coordination (Outreach to schedule with pharm d )   Pam Patel is a 62 y.o. year old female who is a primary care patient of Tower, Audrie Gallus, MD. [name] was referred to the pharmacist for assistance related to HLD and DM.    An unsuccessful telephone outreach was attempted today to contact the patient who was referred to the pharmacy team for assistance with medication assistance. Additional attempts will be made to contact the patient.   Penne Lash , RMA     [facility]  [facility], [facility] Guide  Direct Dial: [phone]  Website: [email or web address]

## 2023-10-08 ENCOUNTER — Encounter: Payer: Self-pay | Admitting: *Deleted

## 2023-10-11 ENCOUNTER — Other Ambulatory Visit: Payer: Self-pay

## 2023-10-11 ENCOUNTER — Other Ambulatory Visit: Payer: Self-pay | Admitting: Pharmacist

## 2023-10-11 DIAGNOSIS — E1142 Type 2 diabetes mellitus with diabetic polyneuropathy: Secondary | ICD-10-CM

## 2023-10-11 DIAGNOSIS — I10 Essential (primary) hypertension: Secondary | ICD-10-CM

## 2023-10-11 DIAGNOSIS — F410 Panic disorder [episodic paroxysmal anxiety] without agoraphobia: Secondary | ICD-10-CM

## 2023-10-11 DIAGNOSIS — E039 Hypothyroidism, unspecified: Secondary | ICD-10-CM

## 2023-10-11 MED ORDER — BASAGLAR KWIKPEN 100 UNIT/ML ~~LOC~~ SOPN: 54.0000 [IU] | PEN_INJECTOR | Freq: Every day | SUBCUTANEOUS | 2 refills | Status: DC

## 2023-10-11 MED ORDER — PAROXETINE HCL 20 MG PO TABS
20.0000 mg | ORAL_TABLET | Freq: Every day | ORAL | 0 refills | Status: DC
  Filled 2023-10-11: qty 90, 90d supply, fill #0

## 2023-10-11 MED ORDER — PEN NEEDLES 31G X 5 MM MISC
4 refills | Status: DC
Start: 2023-10-11 — End: 2024-04-07
  Filled 2023-10-11: qty 100, fill #0

## 2023-10-11 MED ORDER — AMLODIPINE BESYLATE 5 MG PO TABS
5.0000 mg | ORAL_TABLET | Freq: Every day | ORAL | 2 refills | Status: AC
  Filled 2023-10-11: qty 90, 90d supply, fill #0

## 2023-10-11 MED ORDER — GLIPIZIDE ER 10 MG PO TB24
10.0000 mg | ORAL_TABLET | Freq: Every day | ORAL | 6 refills | Status: DC
  Filled 2023-10-11: qty 30, 30d supply, fill #0

## 2023-10-11 MED ORDER — LEVOTHYROXINE SODIUM 75 MCG PO TABS
75.0000 ug | ORAL_TABLET | Freq: Every day | ORAL | 1 refills | Status: DC
  Filled 2023-10-11: qty 90, fill #0

## 2023-10-11 MED ORDER — LISINOPRIL 40 MG PO TABS
40.0000 mg | ORAL_TABLET | Freq: Every day | ORAL | 1 refills | Status: DC
  Filled 2023-10-11: qty 90, 90d supply, fill #0

## 2023-10-11 MED ORDER — METFORMIN HCL ER 500 MG PO TB24
ORAL_TABLET | ORAL | 0 refills | Status: DC
  Filled 2023-10-11: qty 270, fill #0

## 2023-10-11 MED ORDER — METOPROLOL TARTRATE 50 MG PO TABS
50.0000 mg | ORAL_TABLET | Freq: Two times a day (BID) | ORAL | 1 refills | Status: DC
  Filled 2023-10-11: qty 180, 90d supply, fill #0

## 2023-10-11 NOTE — Progress Notes (Signed)
10/11/2023 Name: Pam Patel MRN: [id number] DOB: 11/23/1960  Subjective  Chief Complaint  Patient presents with   Diabetes   Hyperlipidemia   Reason for visit: ?  Pam Patel is a 62 y.o. female with a history of diabetes (type 2), who presents today for an initial diabetes pharmacotherapy visit.? Pertinent PMH also includes HTN, PAD w claudication, HLD, hypothyroid.  Known DM Complications:  Significant PAD/claudication s/p revascularization RLE    Care Team: Primary Care Provider: Judy Pimple, MD   Date of Last Diabetes Related Visit: with PCP on 10/02/23   Recent Summary of Change: Increase insulin glargine to 50 units daily   Medication Access/Adherence: Prescription drug coverage: Payor: / No coverage found. Reports not having insurance since ~2006 when husband's job stopped offering coverage. [facility] (all medication x90 days ~$500 total). Patient income = [place] $900, husband still working income $100k annually.  - Current Patient Assistance: None. Household of 2, income ~$111,000 yearly.  - Patient income = [place] $900, husband still working income $100k annually.   Since Last visit / History of Present Illness: ?  Patient reports implementing plan from last visit. Denies issues with insulin titration. Does feel sugars continue to get a little better with each titration. Denies adverse effects with current medications. Tolerating metformin XR well. Reports severe diarrhea seldomly which she has identified as being related to artificial sweeteners. ~One episode every few weeks only following sugar-free items (Such as Diet Coke).   Reported DM Regimen: ?  glipizide XL 10 mg daily before breakfast (~$49/90 days) metformin ER 500 mg tablet; 1000 mg AM, 500 mg PM (~$49/90 days) Glargine vial 50 units daily at night (~$200/90 days)   DM medications tried in the past:?  N/A  SMBG Per patient memory: ? Checks morning, mid-day, late afternoon  (glucometer) FBG since increasing insulin last: 147 (usally 140s-150s, sometimes lower. Lowest reading 126 mg/dL) After lunch (~1 hour): 193 After dinner (~1-2 hor): 291  Hypo/Hyperglycemia: ?  Symptoms of hypoglycemia since last visit:? no  If yes, it was treated by: n/a  Symptoms of hyperglycemia since last visit:? yes -  Can feel when BG is elevated, dizzy, brain fog, sleepy  Exercise: Limited w severe PAD, recent surgery  DM Prevention:  Statin: Taking; moderate intensity.?  History of chronic kidney disease? no History of albuminuria? no, last UACR on 10/02/23 = 11.2 mg/g ACE/ARB - Taking lisinopril 40 mg daily; Urine MA/CR Ratio - normal <30.  Last eye exam: Unclear; DUE Last foot exam: 05/08/2023 Tobacco Use: Former smoker  Immunizations:? Flu: Due (Last: 10/23/2019); Pneumococcal: No Record - DUE - <65 with Diabetes; Shingrix: No Record - DUE; Covid (No record)  Cardiovascular Risk Reduction History of clinical ASCVD? yes. Severe PAD bilateral s/p revascularization History of heart failure? no  History of hyperlipidemia? yes Current BMI: 31.6 kg/m2 (Ht 66 in, Wt 89 kg) Taking statin? yes; moderate intensity (rosuvastatin 10) Taking aspirin? Not taking  while on Plavix s/p revascularization (leg) Taking SGLT-2i? no Taking GLP- 1 RA? no   Reported HTN Regimen: ?  Amlodipine 5 mg daily Lisinopril 40 mg daily Metoprolol tartrate 50 mg mg twice daily  Patient is not checking their blood pressure at home regularly.   Patient denies hypotensive s/sx. No dizziness, lightheadedness.  Patient denies hypertensive symptoms. No headache, chest pain, shortness of breath, visual changes.      _______________________________________________  Objective    Review of Systems:? Limited in the setting  of virtual visit  Constitutional:? No fever, chills or unintentional weight loss  Cardiovascular:? No chest pain or pressure, shortness of breath, dyspnea on exertion, orthopnea or LE  edema  Pulmonary:? No cough or shortness of breath  GI:? No nausea, vomiting, constipation, diarrhea, abdominal pain, dyspepsia, change in bowel habits  Endocrine:? No polyuria, polyphagia or blurred vision    Physical Examination:  Vitals:  Wt Readings from Last 3 Encounters:  10/02/23 196 lb 4 oz (89 kg)  08/31/23 193 lb 3.2 oz (87.6 kg)  07/26/23 195 lb 12.8 oz (88.8 kg)   BP Readings from Last 3 Encounters:  10/02/23 126/64  08/31/23 (!) 151/98  07/26/23 125/69   Pulse Readings from Last 3 Encounters:  10/02/23 79  08/31/23 (!) 104  07/26/23 63     Labs:?  Lab Results  Component Value Date   HGBA1C 10.2 (A) 10/02/2023   HGBA1C 11.7 (A) 06/26/2023   HGBA1C 13.3 (H) 03/26/2023   GLUCOSE 330 (H) 05/08/2023   MICRALBCREAT 11.2 10/02/2023   CREATININE 0.60 07/26/2023   CREATININE 0.78 05/08/2023   CREATININE 0.76 03/26/2023   GFR 80.85 05/08/2023   GFR 83.48 03/26/2023   GFR 88.14 05/18/2022    Lab Results  Component Value Date   CHOL 136 05/08/2023   LDLCALC 86 12/02/2019   LDLCALC 125 (H) 05/27/2018   LDLCALC 83 02/21/2018   LDLDIRECT 72.0 05/08/2023   LDLDIRECT 165.0 03/26/2023   HDL 32.00 (L) 05/08/2023   TRIG 206.0 (H) 05/08/2023   TRIG 281.0 (H) 03/26/2023   TRIG (H) 12/19/2021    499.0 Triglyceride is over 400; calculations on Lipids are invalid.   ALT 31 03/26/2023   ALT 36 (H) 05/18/2022   AST 37 03/26/2023   AST 39 (H) 05/18/2022      Chemistry      Component Value Date/Time   NA 132 (L) 05/08/2023 1206   K 4.5 05/08/2023 1206   CL 99 05/08/2023 1206   CO2 25 05/08/2023 1206   BUN 12 07/26/2023 1226   CREATININE 0.60 07/26/2023 1226      Component Value Date/Time   CALCIUM 10.9 (H) 05/08/2023 1206   ALKPHOS 96 03/26/2023 1249   AST 37 03/26/2023 1249   ALT 31 03/26/2023 1249   BILITOT 0.6 03/26/2023 1249     The 10-year ASCVD risk score (Arnett DK, et al., 2019) is: 11.5%  Assessment and Plan:   1. Diabetes, type 2: uncontrolled  per last A1c of 10.2% (10/02/23), decreased from previous 11.7% (06/26/23). Goal <7% without hypoglycemia.  Glucometer data shows fasting sugars typically around upper 140s, lowest reading 126 mg/dL. Progressively climb through day with after-dinner sugars >200 mg/dL.  Currently paying almost $200/90 days of insulin (not including needles). Discussed Lilly insulin value program. Agreeable to transition from vial to pens (see below). Will continue vial until gone (2-3 weeks). Prefers in-person injection training.  Current Regimen: Metformin XR 1000/500 mg am/pm, glargine (vial/syringe) 50 units daily (increased >1 week ago), glipizide XL 10 mg AM. Increase glargine 50?54 units daily (8% increase) Continue all other medications Injection training 2 weeks (vials?pens) Reviewed s/sx/tx hypoglycemia Future Consideration: GLP1-RA: Ideal agent though cost-prohibitive given no insurance and high income.  SGLT2i: Reasonable agent though cost-prohibitive given no insurance and high income. Metformin:  Tolerating XR formulation well. Has identified source of GI upset. Increase to 2000 mg BID at follow up if amenable   TZD: Avoiding due to possible weight gain/increase in fracture risk with only moderate A1c-reduction.  2. Medication Cost: Household of 2, income ~$111,000 yearly. Does not qualify for PAP. [facility] (note, patient reports prices are reasonable for her) though these medications should be cheaper elsewhere.   Of [employer], patient prefers [place]. Declines mail order for now. Would like all medications at [facility] including those that are cheaper elsewhere (as below).  Rx refills sent to [facility] for all PCP-prescribed meds (except insulin and rosuvastatin). New eRx for [employer] (glargine) + pen needles Will discuss rosuvastatin increase at office visit in 2 weeks.    [facility]  [facility]  metformin XR   ~$49/90 days $5/month   glipizide XL 10 mg Glipizide XL 5 mg  ~$49/90 days    -- $15/month $5/month fo5mg  $15/90 days  insulin glargine vial  ~$200/90 days  -- --  Insulin syringe/needle  >$100/90 days -- --  Metoprolol tartrate  $10/month $6.20/month  Lisinopril  $5/month   Amlodipine 5  $5/month $6.80 / 90days  Paroxetine  $5/month   Rosuvastatin  $5/month   Levothyroxine 75 mcg  $15/month $8.60 / 90 days    LillyInsulin Value Program: [employer] (as low as $35/month for uninsured)    3. ASCVD (secondary prevention, clinical symptomatic PAD s/p revascularization procedure): Uncontrolled though improved on last lipid panel with LDL 165?72 mg/dL, TG 782?956 mg/dL (May?Jul 2130). LDL goal  <55 mg/dL per symptomatic ASCVD and DM with several risk factors .  Current Regimen: rosuvastatin 10 mg daily On next refill, consider increase to rosva 20 mg daily given no price difference on [facility]   4. HTN: controlled based on last clinic BP of 126/64 mmHg (10/02/23), goal <130/80 mmHg. Does not monitor BP at home. Denies lightheadedness, dizziness, SOB, CP, vision changes.  Current Regimen: amlodipine 5 mg qd, lisinopril 40 mg qd, metoprolol tartrate 50 mg BID Continue medications without changes.   Follow Up Follow up with clinical pharmacist via office visit for injection training in 2 weeks Patient given direct line for questions regarding medication therapy and cost  Future Appointments  Date Time Provider Department Center  10/24/2023  3:00 PM [facility]CM PHARMACIST [facility]    Loree Fee, PharmD Clinical Pharmacist [facility] [phone]

## 2023-10-24 ENCOUNTER — Ambulatory Visit: Payer: Self-pay

## 2023-10-24 NOTE — Progress Notes (Deleted)
10/24/2023 Name: Pam Patel MRN: [id number] DOB: 1961/07/28  Subjective  No chief complaint on file.  Reason for visit: ?  Pam Patel is a 62 y.o. female with a history of diabetes (type 2), who presents today for an initial diabetes pharmacotherapy visit.? Pertinent PMH also includes HTN, PAD w claudication, HLD, hypothyroid.  Known DM Complications:  Significant PAD/claudication s/p revascularization RLE    Care Team: Primary Care Provider: Judy Pimple, MD   Date of Last Diabetes Related Visit: with PCP on 10/02/23   Recent Summary of Change: Increase insulin glargine to 50 units daily   Medication Access/Adherence: Prescription drug coverage: Payor: / No coverage found. Reports not having insurance since ~2006 when husband's job stopped offering coverage. [facility] (all medication x90 days ~$500 total). - Current Patient Assistance: None. Household of 2, income ~$111,000 yearly.  - Patient income = SS $900, husband still working income $100k annually.   Since Last visit / History of Present Illness: ?  Patient reports implementing plan from last visit. Denies issues with insulin titration. Does feel sugars continue to get a little better with each titration. Denies adverse effects with current medications. Tolerating metformin XR well. Reports severe diarrhea seldomly which she has identified as being related to artificial sweeteners. ~One episode every few weeks only following sugar-free items (Such as Diet Coke).   Reported DM Regimen: ?  glipizide XL 10 mg daily before breakfast (~$49/90 days) metformin ER 500 mg tablet; 1000 mg AM, 500 mg PM (~$49/90 days) Glargine vial 50 units daily at night (~$200/90 days)   DM medications tried in the past:?  N/A  SMBG Per patient memory: ? Checks morning, mid-day, late afternoon (glucometer) FBG since increasing insulin last: 147 (usally 140s-150s, sometimes lower. Lowest reading 126 mg/dL) After lunch  (~1 hour): 193 After dinner (~1-2 hor): 291  Hypo/Hyperglycemia: ?  Symptoms of hypoglycemia since last visit:? no  If yes, it was treated by: n/a  Symptoms of hyperglycemia since last visit:? yes -  Can feel when BG is elevated, dizzy, brain fog, sleepy  Exercise: Limited w severe PAD, recent surgery  DM Prevention:  Statin: Taking; moderate intensity.?  History of chronic kidney disease? no History of albuminuria? no, last UACR on 10/02/23 = 11.2 mg/g ACE/ARB - Taking lisinopril 40 mg daily; Urine MA/CR Ratio - normal <30.  Last eye exam: Unclear; DUE Last foot exam: 05/08/2023 Tobacco Use: Former smoker  Immunizations:? Flu: Due (Last: 10/23/2019); Pneumococcal: No Record - DUE - <65 with Diabetes; Shingrix: No Record - DUE; Covid (No record)  Cardiovascular Risk Reduction History of clinical ASCVD? yes. Severe PAD bilateral s/p revascularization History of heart failure? no  History of hyperlipidemia? yes Current BMI: 31.6 kg/m2 (Ht 66 in, Wt 89 kg) Taking statin? yes; moderate intensity (rosuvastatin 10) Taking aspirin? Not taking  while on Plavix s/p revascularization (leg) Taking SGLT-2i? no Taking GLP- 1 RA? no   Reported HTN Regimen: ?  Amlodipine 5 mg daily Lisinopril 40 mg daily Metoprolol tartrate 50 mg mg twice daily  Patient is not checking their blood pressure at home regularly.   Patient denies hypotensive s/sx. No dizziness, lightheadedness.  Patient denies hypertensive symptoms. No headache, chest pain, shortness of breath, visual changes.      _______________________________________________  Objective    Review of Systems:? Limited in the setting of virtual visit  Constitutional:? No fever, chills or unintentional weight loss  Cardiovascular:? No chest pain or pressure, shortness  of breath, dyspnea on exertion, orthopnea or LE edema  Pulmonary:? No cough or shortness of breath  GI:? No nausea, vomiting, constipation, diarrhea, abdominal pain,  dyspepsia, change in bowel habits  Endocrine:? No polyuria, polyphagia or blurred vision    Physical Examination:  Vitals:  Wt Readings from Last 3 Encounters:  10/02/23 196 lb 4 oz (89 kg)  08/31/23 193 lb 3.2 oz (87.6 kg)  07/26/23 195 lb 12.8 oz (88.8 kg)   BP Readings from Last 3 Encounters:  10/02/23 126/64  08/31/23 (!) 151/98  07/26/23 125/69   Pulse Readings from Last 3 Encounters:  10/02/23 79  08/31/23 (!) 104  07/26/23 63     Labs:?  Lab Results  Component Value Date   HGBA1C 10.2 (A) 10/02/2023   HGBA1C 11.7 (A) 06/26/2023   HGBA1C 13.3 (H) 03/26/2023   GLUCOSE 330 (H) 05/08/2023   MICRALBCREAT 11.2 10/02/2023   CREATININE 0.60 07/26/2023   CREATININE 0.78 05/08/2023   CREATININE 0.76 03/26/2023   GFR 80.85 05/08/2023   GFR 83.48 03/26/2023   GFR 88.14 05/18/2022    Lab Results  Component Value Date   CHOL 136 05/08/2023   LDLCALC 86 12/02/2019   LDLCALC 125 (H) 05/27/2018   LDLCALC 83 02/21/2018   LDLDIRECT 72.0 05/08/2023   LDLDIRECT 165.0 03/26/2023   HDL 32.00 (L) 05/08/2023   TRIG 206.0 (H) 05/08/2023   TRIG 281.0 (H) 03/26/2023   TRIG (H) 12/19/2021    499.0 Triglyceride is over 400; calculations on Lipids are invalid.   ALT 31 03/26/2023   ALT 36 (H) 05/18/2022   AST 37 03/26/2023   AST 39 (H) 05/18/2022      Chemistry      Component Value Date/Time   NA 132 (L) 05/08/2023 1206   K 4.5 05/08/2023 1206   CL 99 05/08/2023 1206   CO2 25 05/08/2023 1206   BUN 12 07/26/2023 1226   CREATININE 0.60 07/26/2023 1226      Component Value Date/Time   CALCIUM 10.9 (H) 05/08/2023 1206   ALKPHOS 96 03/26/2023 1249   AST 37 03/26/2023 1249   ALT 31 03/26/2023 1249   BILITOT 0.6 03/26/2023 1249     The 10-year ASCVD risk score (Arnett DK, et al., 2019) is: 11.5%  Assessment and Plan:   1. Diabetes, type 2: uncontrolled per last A1c of 10.2% (10/02/23), decreased from previous 11.7% (06/26/23). Goal <7% without hypoglycemia.  Glucometer  data shows fasting sugars typically around upper 140s, lowest reading 126 mg/dL. Progressively climb through day with after-dinner sugars >200 mg/dL.  Currently paying almost $200/90 days of insulin (not including needles). Discussed Lilly insulin value program. Agreeable to transition from vial to pens (see below). Will continue vial until gone (2-3 weeks). Prefers in-person injection training.  Current Regimen: Metformin XR 1000/500 mg am/pm, glargine (vial/syringe) 50 units daily (increased >1 week ago), glipizide XL 10 mg AM. Increase glargine 50?54 units daily (8% increase) Continue all other medications Injection training 2 weeks (vials?pens) Reviewed s/sx/tx hypoglycemia Future Consideration: GLP1-RA: Ideal agent though cost-prohibitive given no insurance and high income.  SGLT2i: Reasonable agent though cost-prohibitive given no insurance and high income. Metformin:  Tolerating XR formulation well. Has identified source of GI upset. Increase to 2000 mg BID at follow up if amenable   TZD: Avoiding due to possible weight gain/increase in fracture risk with only moderate A1c-reduction.    2. Medication Cost: Household of 2, income ~$111,000 yearly. Does not qualify for PAP. [facility] (note, patient reports prices are reasonable for her) though these medications should be cheaper elsewhere.   Of [employer], patient prefers [place]. Declines mail order for now. Would like all medications at [facility] including those that are cheaper elsewhere (as below).  Rx refills sent to [facility] for all PCP-prescribed meds (except insulin and rosuvastatin). New eRx for [employer] (glargine) + pen needles Will discuss rosuvastatin increase at office visit in 2 weeks.    [facility]  metformin XR  ~$49/90 days $5/month   glipizide XL 10 mg Glipizide XL 5 mg  ~$49/90 days    -- $15/month $5/month fo5mg  $15/90  days  insulin glargine vial  ~$200/90 days  -- --  Insulin syringe/needle  >$100/90 days -- --  Metoprolol tartrate  $10/month $6.20/month  Lisinopril  $5/month   Amlodipine 5  $5/month $6.80 / 90days  Paroxetine  $5/month   Rosuvastatin  $5/month   Levothyroxine 75 mcg  $15/month $8.60 / 90 days    [employer]illy[employer]ulin Value Program: [employer] (as low as $35/month for uninsured)    3. ASCVD (secondary prevention, clinical symptomatic PAD s/p revascularization procedure): Uncontrolled though improved on last lipid panel with LDL 165?72 mg/dL, TG 119?147 mg/dL (May?Jul 8295). LDL goal  <55 mg/dL per symptomatic ASCVD and DM with several risk factors .  Current Regimen: rosuvastatin 10 mg daily On next refill, consider increase to rosva 20 mg daily given no price difference on [facility]   4. HTN: controlled based on last clinic BP of 126/64 mmHg (10/02/23), goal <130/80 mmHg. Does not monitor BP at home. Denies lightheadedness, dizziness, SOB, CP, vision changes.  Current Regimen: amlodipine 5 mg qd, lisinopril 40 mg qd, metoprolol tartrate 50 mg BID Continue medications without changes.   Follow Up Follow up with clinical pharmacist via office visit for injection training in 2 weeks Patient given direct line for questions regarding medication therapy and cost  Future Appointments  Date Time Provider Department Center  10/24/2023  3:00 PM [facility] PHARMACIST [facility]    Loree Fee, PharmD Clinical Pharmacist [facility] [phone]

## 2023-10-29 ENCOUNTER — Other Ambulatory Visit: Payer: Self-pay | Admitting: Family Medicine

## 2023-11-21 ENCOUNTER — Telehealth (INDEPENDENT_AMBULATORY_CARE_PROVIDER_SITE_OTHER): Payer: Self-pay

## 2023-11-21 NOTE — Telephone Encounter (Signed)
 Spoke with the patient and she is scheduled with Dr. Vonna Guardian for a LLE angio, with a 6:45 am arrival time to the [facility]. Pre-procedure instructions were discussed and will be mailed as patient stated she doesn't use Mychart.

## 2023-11-22 ENCOUNTER — Other Ambulatory Visit: Payer: Self-pay | Admitting: Family Medicine

## 2023-11-22 DIAGNOSIS — F410 Panic disorder [episodic paroxysmal anxiety] without agoraphobia: Secondary | ICD-10-CM

## 2023-11-25 ENCOUNTER — Other Ambulatory Visit: Payer: Self-pay | Admitting: Family Medicine

## 2023-11-25 DIAGNOSIS — I10 Essential (primary) hypertension: Secondary | ICD-10-CM

## 2023-11-29 ENCOUNTER — Other Ambulatory Visit: Payer: Self-pay

## 2023-11-29 ENCOUNTER — Emergency Department
Admission: EM | Admit: 2023-11-29 | Discharge: 2023-11-29 | Disposition: A | Discharge status: Home / Self Care | Payer: Self-pay | Attending: Emergency Medicine | Admitting: Emergency Medicine

## 2023-11-29 ENCOUNTER — Emergency Department: Payer: Self-pay

## 2023-11-29 ENCOUNTER — Encounter: Payer: Self-pay | Admitting: *Deleted

## 2023-11-29 DIAGNOSIS — S0990XA Unspecified injury of head, initial encounter: Secondary | ICD-10-CM

## 2023-11-29 DIAGNOSIS — Y9389 Activity, other specified: Secondary | ICD-10-CM | POA: Insufficient documentation

## 2023-11-29 DIAGNOSIS — W138XXA Fall from, out of or through other building or structure, initial encounter: Secondary | ICD-10-CM | POA: Insufficient documentation

## 2023-11-29 DIAGNOSIS — S0181XA Laceration without foreign body of other part of head, initial encounter: Secondary | ICD-10-CM

## 2023-11-29 MED ORDER — HYDROCODONE-ACETAMINOPHEN 5-325 MG PO TABS: 1.0000 | ORAL_TABLET | Freq: Three times a day (TID) | ORAL | 0 refills | Status: AC | PRN

## 2023-11-29 MED ORDER — HYDROCODONE-ACETAMINOPHEN 5-325 MG PO TABS
1.0000 | ORAL_TABLET | Freq: Once | ORAL | Status: AC
  Administered 2023-11-29: 1 via ORAL
  Filled 2023-11-29: qty 1

## 2023-11-29 MED ORDER — ONDANSETRON 4 MG PO TBDP
4.0000 mg | ORAL_TABLET | Freq: Once | ORAL | Status: AC
  Administered 2023-11-29: 4 mg via ORAL
  Filled 2023-11-29: qty 1

## 2023-11-29 MED ORDER — LIDOCAINE-EPINEPHRINE (PF) 2 %-1:200000 IJ SOLN
20.0000 mL | Freq: Once | INTRAMUSCULAR | Status: DC
  Filled 2023-11-29: qty 20

## 2023-11-29 MED ORDER — ONDANSETRON 4 MG PO TBDP: 4.0000 mg | ORAL_TABLET | Freq: Three times a day (TID) | ORAL | 0 refills | Status: DC | PRN

## 2023-11-29 NOTE — ED Triage Notes (Signed)
Pt to triage via wheelchair.  Pt was struck in the face by a wood valance while standing on a stool hanging curtains.  No loc  pt has laceration above right eyebrow.  Pt alert  speech clear.

## 2023-11-29 NOTE — Discharge Instructions (Addendum)
Your exam and CT scan are normal at this time. Your facial laceration has been repaired using absorbable sutures and Dermabond glue. Avoid any lotions, creams, oils, or ointments over the glue. See your PCP as needed. Take the pain medicine as needed. Take OTC Tylenol for non-drowsy pain relief.

## 2023-11-29 NOTE — ED Provider Triage Note (Signed)
Emergency Medicine Provider Triage Evaluation Note  Pam Patel, a 63 y.o. female  was evaluated in triage.  Pt complains of right brow laceration. She presents to the ED for evaluation of an accidental laceraton over the right brow after a wooden valance she was attempting to hang up, fell off the wall. She denies LOC, NV, or vision change.   Review of Systems  Positive: Facial lac Negative: LOC  Physical Exam  There were no vitals taken for this visit. Gen:   Awake, no distress  NAD Resp:  Normal effort CTA MSK:   Moves extremities without difficulty  Other:    Medical Decision Making  Medically screening exam initiated at 4:26 PM.  Appropriate orders placed.  Pam Patel was informed that the remainder of the evaluation will be completed by another provider, this initial triage assessment does not replace that evaluation, and the importance of remaining in the ED until their evaluation is complete.  Patient to the ED for evaluation of a facial laceratoin to the right brow.    Pam Hoard, PA-C 11/29/23 1627

## 2023-11-29 NOTE — ED Triage Notes (Addendum)
 Marland Kitchen

## 2023-12-03 ENCOUNTER — Other Ambulatory Visit: Payer: Self-pay

## 2023-12-03 ENCOUNTER — Encounter: Payer: Self-pay | Admitting: Vascular Surgery

## 2023-12-03 ENCOUNTER — Ambulatory Visit
Admission: RE | Admit: 2023-12-03 | Discharge: 2023-12-03 | Disposition: A | Discharge status: Home / Self Care | Payer: Self-pay | Attending: Vascular Surgery | Admitting: Vascular Surgery

## 2023-12-03 ENCOUNTER — Encounter
Admission: RE | Disposition: A | Discharge status: Home / Self Care | Payer: Self-pay | Source: Home / Self Care | Attending: Vascular Surgery

## 2023-12-03 DIAGNOSIS — E785 Hyperlipidemia, unspecified: Secondary | ICD-10-CM | POA: Insufficient documentation

## 2023-12-03 DIAGNOSIS — Z9889 Other specified postprocedural states: Secondary | ICD-10-CM

## 2023-12-03 DIAGNOSIS — E1142 Type 2 diabetes mellitus with diabetic polyneuropathy: Secondary | ICD-10-CM | POA: Insufficient documentation

## 2023-12-03 DIAGNOSIS — I70212 Atherosclerosis of native arteries of extremities with intermittent claudication, left leg: Secondary | ICD-10-CM

## 2023-12-03 DIAGNOSIS — I70213 Atherosclerosis of native arteries of extremities with intermittent claudication, bilateral legs: Secondary | ICD-10-CM | POA: Insufficient documentation

## 2023-12-03 DIAGNOSIS — E1169 Type 2 diabetes mellitus with other specified complication: Secondary | ICD-10-CM | POA: Insufficient documentation

## 2023-12-03 DIAGNOSIS — I701 Atherosclerosis of renal artery: Secondary | ICD-10-CM

## 2023-12-03 DIAGNOSIS — F17290 Nicotine dependence, other tobacco product, uncomplicated: Secondary | ICD-10-CM | POA: Insufficient documentation

## 2023-12-03 DIAGNOSIS — I70219 Atherosclerosis of native arteries of extremities with intermittent claudication, unspecified extremity: Secondary | ICD-10-CM

## 2023-12-03 DIAGNOSIS — I119 Hypertensive heart disease without heart failure: Secondary | ICD-10-CM | POA: Insufficient documentation

## 2023-12-03 HISTORY — PX: LOWER EXTREMITY ANGIOGRAPHY: CATH118251

## 2023-12-03 LAB — GLUCOSE, CAPILLARY
Glucose-Capillary: 186 mg/dL — ABNORMAL HIGH (ref 70–99)
Glucose-Capillary: 255 mg/dL — ABNORMAL HIGH (ref 70–99)

## 2023-12-03 LAB — CREATININE, SERUM
Creatinine, Ser: 0.59 mg/dL (ref 0.44–1.00)
GFR, Estimated: >60 mL/min (ref >60)

## 2023-12-03 LAB — BUN: BUN: 14 mg/dL (ref 8–23)

## 2023-12-03 SURGERY — LOWER EXTREMITY ANGIOGRAPHY
Anesthesia: Moderate Sedation | Site: Leg Lower | Laterality: Left

## 2023-12-03 MED ORDER — FENTANYL CITRATE (PF) 100 MCG/2ML IJ SOLN
INTRAMUSCULAR | Status: DC | PRN
  Administered 2023-12-03 (×4): 25 ug via INTRAVENOUS
  Administered 2023-12-03: 50 ug via INTRAVENOUS

## 2023-12-03 MED ORDER — MIDAZOLAM HCL 2 MG/2ML IJ SOLN
INTRAMUSCULAR | Status: DC | PRN
  Administered 2023-12-03 (×2): 1 mg via INTRAVENOUS
  Administered 2023-12-03: 2 mg via INTRAVENOUS

## 2023-12-03 MED ORDER — HYDROMORPHONE HCL 1 MG/ML IJ SOLN
INTRAMUSCULAR | Status: AC
  Administered 2023-12-03: 1 mg via INTRAVENOUS
  Filled 2023-12-03: qty 1

## 2023-12-03 MED ORDER — MIDAZOLAM HCL 2 MG/ML PO SYRP: 8.0000 mg | ORAL_SOLUTION | Freq: Once | ORAL | Status: DC | PRN

## 2023-12-03 MED ORDER — LIDOCAINE-EPINEPHRINE (PF) 1 %-1:200000 IJ SOLN
INTRAMUSCULAR | Status: DC | PRN
  Administered 2023-12-03 (×2): 10 mL

## 2023-12-03 MED ORDER — HEPARIN SODIUM (PORCINE) 1000 UNIT/ML IJ SOLN
INTRAMUSCULAR | Status: AC
  Filled 2023-12-03: qty 10

## 2023-12-03 MED ORDER — HYDRALAZINE HCL 20 MG/ML IJ SOLN
INTRAMUSCULAR | Status: AC
  Filled 2023-12-03: qty 1

## 2023-12-03 MED ORDER — HYDROMORPHONE HCL 1 MG/ML IJ SOLN: 1.0000 mg | Freq: Once | INTRAMUSCULAR | Status: AC | PRN

## 2023-12-03 MED ORDER — MIDAZOLAM HCL 2 MG/2ML IJ SOLN
INTRAMUSCULAR | Status: AC
Start: 2023-12-03 — End: ?
  Filled 2023-12-03: qty 2

## 2023-12-03 MED ORDER — SODIUM CHLORIDE 0.9 % IV SOLN: INTRAVENOUS | Status: DC

## 2023-12-03 MED ORDER — METHYLPREDNISOLONE SODIUM SUCC 125 MG IJ SOLR: 125.0000 mg | Freq: Once | INTRAMUSCULAR | Status: DC | PRN

## 2023-12-03 MED ORDER — ONDANSETRON HCL 4 MG/2ML IJ SOLN: 4.0000 mg | Freq: Four times a day (QID) | INTRAMUSCULAR | Status: DC | PRN

## 2023-12-03 MED ORDER — SODIUM CHLORIDE 0.9% FLUSH: 3.0000 mL | Freq: Two times a day (BID) | INTRAVENOUS | Status: DC

## 2023-12-03 MED ORDER — SODIUM CHLORIDE 0.9 % IV SOLN: 250.0000 mL | INTRAVENOUS | Status: DC | PRN

## 2023-12-03 MED ORDER — ASPIRIN EC 81 MG PO TBEC: 81.0000 mg | DELAYED_RELEASE_TABLET | Freq: Every day | ORAL | 11 refills | Status: AC

## 2023-12-03 MED ORDER — SODIUM CHLORIDE 0.9% FLUSH: 3.0000 mL | INTRAVENOUS | Status: DC | PRN

## 2023-12-03 MED ORDER — FAMOTIDINE 20 MG PO TABS: 40.0000 mg | ORAL_TABLET | Freq: Once | ORAL | Status: DC | PRN

## 2023-12-03 MED ORDER — ASPIRIN 81 MG PO TBEC
DELAYED_RELEASE_TABLET | ORAL | Status: AC
  Administered 2023-12-03: 81 mg via ORAL
  Filled 2023-12-03: qty 1

## 2023-12-03 MED ORDER — HEPARIN (PORCINE) IN NACL 1000-0.9 UT/500ML-% IV SOLN
INTRAVENOUS | Status: DC | PRN
  Administered 2023-12-03 (×2): 1000 mL

## 2023-12-03 MED ORDER — FENTANYL CITRATE PF 50 MCG/ML IJ SOSY
PREFILLED_SYRINGE | INTRAMUSCULAR | Status: AC
  Filled 2023-12-03: qty 1

## 2023-12-03 MED ORDER — HYDRALAZINE HCL 20 MG/ML IJ SOLN
10.0000 mg | Freq: Once | INTRAMUSCULAR | Status: AC
  Administered 2023-12-03: 10 mg via INTRAVENOUS

## 2023-12-03 MED ORDER — MIDAZOLAM HCL 2 MG/2ML IJ SOLN
INTRAMUSCULAR | Status: AC
Start: 2023-12-03 — End: ?
  Filled 2023-12-03: qty 4

## 2023-12-03 MED ORDER — ONDANSETRON HCL 4 MG/2ML IJ SOLN
INTRAMUSCULAR | Status: AC
  Filled 2023-12-03: qty 2

## 2023-12-03 MED ORDER — LABETALOL HCL 5 MG/ML IV SOLN: 10.0000 mg | INTRAVENOUS | Status: DC | PRN

## 2023-12-03 MED ORDER — ONDANSETRON HCL 4 MG/2ML IJ SOLN
INTRAMUSCULAR | Status: AC
  Administered 2023-12-03: 4 mg via INTRAVENOUS
  Filled 2023-12-03: qty 2

## 2023-12-03 MED ORDER — FENTANYL CITRATE (PF) 100 MCG/2ML IJ SOLN
INTRAMUSCULAR | Status: AC
  Filled 2023-12-03: qty 2

## 2023-12-03 MED ORDER — HYDRALAZINE HCL 20 MG/ML IJ SOLN: 5.0000 mg | INTRAMUSCULAR | Status: DC | PRN

## 2023-12-03 MED ORDER — ACETAMINOPHEN 325 MG PO TABS: 650.0000 mg | ORAL_TABLET | ORAL | Status: DC | PRN

## 2023-12-03 MED ORDER — IODIXANOL 320 MG/ML IV SOLN
INTRAVENOUS | Status: DC | PRN
  Administered 2023-12-03: 65 mL

## 2023-12-03 MED ORDER — CEFAZOLIN SODIUM-DEXTROSE 2-4 GM/100ML-% IV SOLN
2.0000 g | INTRAVENOUS | Status: AC
  Administered 2023-12-03: 2 g via INTRAVENOUS

## 2023-12-03 MED ORDER — ASPIRIN 81 MG PO TBEC: 81.0000 mg | DELAYED_RELEASE_TABLET | Freq: Every day | ORAL | Status: DC

## 2023-12-03 MED ORDER — CEFAZOLIN SODIUM-DEXTROSE 2-4 GM/100ML-% IV SOLN
INTRAVENOUS | Status: AC
  Filled 2023-12-03: qty 100

## 2023-12-03 MED ORDER — DIPHENHYDRAMINE HCL 50 MG/ML IJ SOLN: 50.0000 mg | Freq: Once | INTRAMUSCULAR | Status: DC | PRN

## 2023-12-03 MED ORDER — HEPARIN SODIUM (PORCINE) 1000 UNIT/ML IJ SOLN
INTRAMUSCULAR | Status: DC | PRN
  Administered 2023-12-03: 5000 [IU] via INTRAVENOUS

## 2023-12-03 MED ORDER — ONDANSETRON HCL 4 MG/2ML IJ SOLN
4.0000 mg | Freq: Once | INTRAMUSCULAR | Status: AC
  Administered 2023-12-03: 4 mg via INTRAVENOUS

## 2023-12-03 SURGICAL SUPPLY — 21 items
BALLN LUTONIX 018 5X300X130 (BALLOONS) ×2
BALLN ULTRVRSE 018 2.5X150X150 (BALLOONS) ×1
BALLN ULTRVRSE 3X100X150 (BALLOONS) ×1
BALLOON LUTONIX 018 5X300X130 (BALLOONS) IMPLANT
BALLOON ULTRVRSE 3X100X150 (BALLOONS) IMPLANT
BALLOON ULTRVS 018 2.5X150X150 (BALLOONS) IMPLANT
CATH ANGIO 5F PIGTAIL 65CM (CATHETERS) IMPLANT
CATH VERT 5X100 (CATHETERS) IMPLANT
COVER PROBE ULTRASOUND 5X96 (MISCELLANEOUS) IMPLANT
DEVICE PRESTO INFLATION (MISCELLANEOUS) IMPLANT
DEVICE STARCLOSE SE CLOSURE (Vascular Products) IMPLANT
GLIDEWIRE ADV .035X260CM (WIRE) IMPLANT
PACK ANGIOGRAPHY (CUSTOM PROCEDURE TRAY) ×2 IMPLANT
SHEATH ANL2 6FRX45 HC (SHEATH) IMPLANT
SHEATH BRITE TIP 5FRX11 (SHEATH) IMPLANT
STENT VIABAHN 6X100X120 (Permanent Stent) IMPLANT
STENT VIABAHN 6X250X120 (Permanent Stent) IMPLANT
SYR MEDRAD MARK 7 150ML (SYRINGE) IMPLANT
TUBING CONTRAST HIGH PRESS 72 (TUBING) IMPLANT
WIRE G V18X300CM (WIRE) IMPLANT
WIRE GUIDERIGHT .035X150 (WIRE) IMPLANT

## 2023-12-03 NOTE — Op Note (Signed)
[facility]  Percutaneous Study/Intervention Procedural Note   Date of Surgery: 12/03/2023  Surgeon(s):Nahiara Kretzschmar    Assistants:none  Pre-operative Diagnosis: PAD with claudication BLE  Post-operative diagnosis:  Same  Procedure(s) Performed:             1.  Ultrasound guidance for vascular access right femoral artery             2.  Catheter placement into left common femoral artery from right femoral approach             3.  Aortogram and selective left lower extremity angiogram             4.  Percutaneous transluminal angioplasty of left tibioperoneal trunk and proximal peroneal artery with 3 mm diameter by 10 cm length angioplasty balloon             5.  Percutaneous transluminal angioplasty of left anterior tibial artery with 2.5 mm diameter by 15 cm length angioplasty balloon  6.  Percutaneous transluminal angioplasty of left SFA and above-knee popliteal artery with 5 mm diameter by 30 cm length Lutonix drug-coated angioplasty balloon  7.  Stent placement to the left SFA and above-knee popliteal artery with 6 mm diameter by 25 cm length and 6 mm diameter by 10 cm length Viabahn stents            8.  StarClose closure device right femoral artery  EBL: 10 cc  Contrast: 65 cc  Fluoro Time: 7.3 minutes  Moderate Conscious Sedation Time: approximately 46 minutes using 4 mg of Versed and 150 mcg of Fentanyl              Indications:  Patient is a 63 y.o.female with disabling claudication symptoms of the left lower extremity.  She is status post right lower extremity revascularization last year with good results. The patient has noninvasive study showing significant reduction of the left ABI. The patient is brought in for angiography for further evaluation and potential treatment. Risks and benefits are discussed and informed consent is obtained.   Procedure:  The patient was identified and appropriate procedural time out was performed.  The patient was then  placed supine on the table and prepped and draped in the usual sterile fashion. Moderate conscious sedation was administered during a face to face encounter with the patient throughout the procedure with my supervision of the RN administering medicines and monitoring the patient's vital signs, pulse oximetry, telemetry and mental status throughout from the start of the procedure until the patient was taken to the recovery room. Ultrasound was used to evaluate the right common femoral artery.  It was patent .  A digital ultrasound image was acquired.  A Seldinger needle was used to access the right common femoral artery under direct ultrasound guidance and a permanent image was performed.  A 0.035 J wire was advanced without resistance and a 5Fr sheath was placed.  Pigtail catheter was placed into the aorta and an AP aortogram was performed.  This demonstrated mild right renal artery stenosis, what appeared to be >60% left renal artery stenosis. The aorta and iliac arteries did not appear to have high-grade stenosis. I then crossed the aortic bifurcation and advanced to the left femoral head. Selective left lower extremity angiogram was then performed. This demonstrated diffuse SFA and above-knee popliteal artery disease throughout its course with multiple areas of greater than 80% stenosis throughout.  The below-knee popliteal artery was fairly normal.  There was  then moderate stenosis of the tibioperoneal trunk in the 60 to 70% range.  The anterior tibial artery was actually the largest vessel into the foot but had about a 70 to 75% stenosis in the proximal segment. It was felt that it was in the patient's best interest to proceed with intervention after these images to avoid a second procedure and a larger amount of contrast and fluoroscopy based off of the findings from the initial angiogram. The patient was systemically heparinized and a 6 [place] Ansell sheath was then placed over the [employer] wire. I  then used a Kumpe catheter and the advantage wire to navigate through the SFA and popliteal disease without difficulty and confirm intraluminal flow in the below-knee popliteal artery.  I then exchanged for a V18 wire and first crossed the tibioperoneal trunk stenosis parking the wire in the distal peroneal artery.  A 3 mm diameter by 10 cm length angioplasty balloon was then inflated to 8 atm for 1 minute in the left tibioperoneal trunk and most proximal peroneal artery.  Completion imaging showed significant improvement with only about a 30 to 35% residual stenosis in the tibioperoneal trunk.  I then used a Kumpe catheter and the V18 wire and cannulated the anterior tibial artery crossing this stenosis without difficulty and parking the wire in the foot.  A 2.5 mm diameter by 15 cm length angioplasty balloon was then inflated to 10 atm for 1 minute.  Completion imaging showed about a 20 to 25% residual stenosis after angioplasty of the anterior tibial artery.  I then turned my attention to the SFA and popliteal disease which was diffuse and severe.  A 5 mm diameter by 30 cm length Lutonix drug-coated angioplasty balloon was inflated to 10 atm for 1 minute.  Completion imaging showed minimal improvement with diffuse residual disease requiring stent placement.  A 6 mm diameter by 25 cm length Viabahn stent and then a 6 mm diameter by 10 cm length Viabahn stent with about 2 cm of overlap were used to stent from the most proximal SFA to the above-knee popliteal artery and encompassed the entirety of the lesion.  These were postdilated with 5 mm balloon with excellent angiographic completion result and less than 10% residual stenosis. I elected to terminate the procedure. The sheath was removed and StarClose closure device was deployed in the right femoral artery with excellent hemostatic result. The patient was taken to the recovery room in stable condition having tolerated the procedure well.  Findings:                Aortogram:  This demonstrated mild right renal artery stenosis, what appeared to be >60% left renal artery stenosis. The aorta and iliac arteries did not appear to have high-grade stenosis.              Left Lower Extremity:  This demonstrated diffuse SFA and above-knee popliteal artery disease throughout its course with multiple areas of greater than 80% stenosis throughout.  The below-knee popliteal artery was fairly normal.  There was then moderate stenosis of the tibioperoneal trunk in the 60 to 70% range.  The anterior tibial artery was actually the largest vessel into the foot but had about a 70 to 75% stenosis in the proximal segment.   Disposition: Patient was taken to the recovery room in stable condition having tolerated the procedure well.  Complications: None  Pam Patel 12/03/2023 9:28 AM   This note was created with Dragon Medical transcription system. Any  errors in dictation are purely unintentional.

## 2023-12-03 NOTE — H&P (Signed)
[facility] Admission History & Physical  MRN : [id number]  Pam Patel is a 63 y.o. ([birth year]) female who presents with chief complaint of No chief complaint on file. Marland Kitchen  History of Present Illness: Patient presents today for left lower extremity angiogram.  Has previously had her right lower extremity treated for disabling claudication symptoms about 3 months prior.  Doing well from this.  Still having severe claudication symptoms on the left.  Current Facility-Administered Medications  Medication Dose Route Frequency Provider Last Rate Last Admin   0.9 %  sodium chloride infusion   Intravenous Continuous Georgiana Spinner, NP 75 mL/hr at 12/03/23 0754 New Bag at 12/03/23 0754   ceFAZolin (ANCEF) IVPB 2g/100 mL premix  2 g Intravenous 30 min Pre-Op Georgiana Spinner, NP       diphenhydrAMINE (BENADRYL) injection 50 mg  50 mg Intravenous Once PRN Georgiana Spinner, NP       famotidine (PEPCID) tablet 40 mg  40 mg Oral Once PRN Sheppard Plumber E, NP       Heparin (Porcine) in NaCl 1000-0.9 UT/500ML-% SOLN    PRN Annice Needy, MD   1,000 mL at 12/03/23 0800   lidocaine-EPINEPHrine (PF) (XYLOCAINE-EPINEPHrine) 1 %-1:200000 (PF) injection    PRN Annice Needy, MD   10 mL at 12/03/23 0800   methylPREDNISolone sodium succinate (SOLU-MEDROL) 125 mg/2 mL injection 125 mg  125 mg Intravenous Once PRN Georgiana Spinner, NP       midazolam (VERSED) 2 MG/ML syrup 8 mg  8 mg Oral Once PRN Georgiana Spinner, NP       ondansetron [facility]) 4 MG/2ML injection             Past Medical History:  Diagnosis Date   History of chickenpox    History of diabetes mellitus, type II    History of hypertension    Hypertension     Past Surgical History:  Procedure Laterality Date   APPENDECTOMY  1990   BACK SURGERY     LOWER EXTREMITY ANGIOGRAPHY Right 07/26/2023   Procedure: Lower Extremity Angiography;  Surgeon: Annice Needy, MD;  Location: [facility] INVASIVE CV LAB;  Service: Cardiovascular;   Laterality: Right;     Social History   Tobacco Use   Smoking status: Former   Smokeless tobacco: Never  Vaping Use   Vaping status: Every Day  Substance Use Topics   Alcohol use: Not Currently   Drug use: Never     Family History  Problem Relation Age of Onset   COPD Mother    Diabetes Mother    Heart attack Mother    Heart disease Mother    Hypertension Mother    Stroke Mother    Heart attack Father     Allergies  Allergen Reactions   Penicillins Hives    Has patient had a PCN reaction causing immediate rash, facial/tongue/throat swelling, SOB or lightheadedness with hypotension: Unknown Has patient had a PCN reaction causing severe rash involving mucus membranes or skin necrosis: No Has patient had a PCN reaction that required hospitalization: No Has patient had a PCN reaction occurring within the last 10 years: No If all of the above answers are "NO", then may proceed with Cephalosporin use.    Naproxen     REACTION: hives      REVIEW OF SYSTEMS (Negative unless checked)   Constitutional: [] Weight loss  [] Fever  [] Chills Cardiac: [] Chest pain   [] Chest pressure   []   Palpitations   [] Shortness of breath when laying flat   [] Shortness of breath at rest   [] Shortness of breath with exertion. Vascular:  [x] Pain in legs with walking   [] Pain in legs at rest   [] Pain in legs when laying flat   [] Claudication   [] Pain in feet when walking  [x] Pain in feet at rest  [] Pain in feet when laying flat   [] History of DVT   [] Phlebitis   [] Swelling in legs   [] Varicose veins   [] Non-healing ulcers Pulmonary:   [] Uses home oxygen   [] Productive cough   [] Hemoptysis   [] Wheeze  [] COPD   [] Asthma Neurologic:  [] Dizziness  [] Blackouts   [] Seizures   [] History of stroke   [] History of TIA  [] Aphasia   [] Temporary blindness   [] Dysphagia   [] Weakness or numbness in arms   [] Weakness or numbness in legs Musculoskeletal:  [x] Arthritis   [] Joint swelling   [x] Joint pain   [] Low back  pain Hematologic:  [] Easy bruising  [] Easy bleeding   [] Hypercoagulable state   [] Anemic  [] Hepatitis Gastrointestinal:  [] Blood in stool   [] Vomiting blood  [] Gastroesophageal reflux/heartburn   [] Abdominal pain Genitourinary:  [] Chronic kidney disease   [] Difficult urination  [] Frequent urination  [] Burning with urination   [] Hematuria Skin:  [] Rashes   [] Ulcers   [] Wounds Psychological:  [] History of anxiety   []  History of major depression.    Physical Examination  Vitals:   12/03/23 0744  BP: (!) 188/86  Pulse: 65  Resp: 18  Temp: 98.1 F (36.7 C)  TempSrc: Oral  SpO2: 98%  Weight: 90 kg  Height: 5\' 5"  (1.651 m)   Body mass index is 33.03 kg/m. Gen: WD/WN, NAD Head: [place]/AT, No temporalis wasting.  Ear/Nose/Throat: Hearing grossly intact, nares w/o erythema or drainage, oropharynx w/o Erythema/Exudate,  Eyes: Conjunctiva clear, sclera non-icteric Neck: Trachea midline.  No JVD.  Pulmonary:  Good air movement, respirations not labored, no use of accessory muscles.  Cardiac: RRR, normal S1, S2. Vascular:  Vessel Right Left  Radial Palpable Palpable                          PT 2+ Palpable Not Palpable  DP 1+ Palpable 1+ Palpable   Gastrointestinal: soft, non-tender/non-distended. No guarding/reflex.  Musculoskeletal: M/S 5/5 throughout.  Extremities without ischemic changes.  No deformity or atrophy.  Neurologic: Sensation grossly intact in extremities.  Symmetrical.  Speech is fluent. Motor exam as listed above. Psychiatric: Judgment intact, Mood & affect appropriate for pt's clinical situation. Dermatologic: No rashes or ulcers noted.  No cellulitis or open wounds.      CBC Lab Results  Component Value Date   WBC 8.9 03/26/2023   HGB 15.1 (H) 03/26/2023   HCT 43.8 03/26/2023   MCV 86.8 03/26/2023   PLT 287.0 03/26/2023    BMET    Component Value Date/Time   NA 132 (L) 05/08/2023 1206   K 4.5 05/08/2023 1206   CL 99 05/08/2023 1206   CO2 25  05/08/2023 1206   GLUCOSE 330 (H) 05/08/2023 1206   BUN 12 07/26/2023 1226   CREATININE 0.60 07/26/2023 1226   CALCIUM 10.9 (H) 05/08/2023 1206   GFRNONAA >60 07/26/2023 1226   GFRAA >60 02/22/2018 0514   CrCl cannot be calculated (Patient's most recent lab result is older than the maximum 21 days allowed.).  COAG Lab Results  Component Value Date   INR 1.10 02/20/2018    Radiology  CT HEAD WO CONTRAST ( ) Result Date: 11/29/2023 CLINICAL DATA:  Status post trauma. EXAM: CT HEAD WITHOUT CONTRAST TECHNIQUE: Contiguous axial images were obtained from the base of the skull through the vertex without intravenous contrast. RADIATION DOSE REDUCTION: This exam was performed according to the departmental dose-optimization program which includes automated exposure control, adjustment of the mA and/or kV according to patient size and/or use of iterative reconstruction technique. COMPARISON:  None Available. FINDINGS: Brain: There is mild cerebral atrophy with widening of the extra-axial spaces and ventricular dilatation. There are areas of decreased attenuation within the white matter tracts of the supratentorial brain, consistent with microvascular disease changes. Vascular: Mild to moderate severity calcification of the bilateral cavernous carotid arteries is seen. Skull: Normal. Negative for fracture or focal lesion. Sinuses/Orbits: No acute finding. Other: An ill-defined superficial right supra orbital soft tissue defect is seen with a mild amount of associated soft tissue swelling. IMPRESSION: 1. No acute intracranial abnormality. 2. Generalized cerebral atrophy with chronic white matter small vessel ischemic changes. 3. Superficial right supra orbital soft tissue defect with a mild amount of associated soft tissue swelling. Electronically Signed   By: Aram Candela M.D.   On: 11/29/2023 17:14     Assessment/Plan Hypertension blood pressure control important in reducing the progression of  atherosclerotic disease. On appropriate oral medications.     DM type 2 with diabetic peripheral neuropathy (HCC) blood glucose control important in reducing the progression of atherosclerotic disease. Also, involved in wound healing. On appropriate medications.     Hyperlipidemia associated with type 2 diabetes mellitus (HCC) lipid control important in reducing the progression of atherosclerotic disease. Continue statin therapy   Atherosclerosis of native arteries of extremity with intermittent claudication (HCC)  S/p right leg intervention, returns for left leg intervention today.  Risks and benefits are discussed.  Informed consent contained.   Festus Barren, MD  12/03/2023 8:06 AM

## 2023-12-04 ENCOUNTER — Encounter: Payer: Self-pay | Admitting: Vascular Surgery

## 2023-12-04 NOTE — ED Provider Notes (Signed)
[facility] Emergency Department Provider Note     Event Date/Time   First MD Initiated Contact with Patient 11/29/23 1916     (approximate)   History   Laceration   HPI  KORY PANJWANI is a 63 y.o. female with a noncontributory medical history, presents to the ED with complaints of a right brow laceration. She presents to the ED for evaluation of an accidental laceraton over the right brow after a wooden valance she was attempting to hang up, fell off the wall. She denies LOC, NV, or vision change.     Physical Exam   Triage Vital Signs: ED Triage Vitals  Encounter Vitals Group     BP 11/29/23 1639 (!) 184/83     Systolic BP Percentile --      Diastolic BP Percentile --      Pulse Rate 11/29/23 1639 79     Resp 11/29/23 1639 18     Temp 11/29/23 1639 98 F (36.7 C)     Temp Source 11/29/23 1639 Oral     SpO2 11/29/23 1639 100 %     Weight 11/29/23 1649 185 lb (83.9 kg)     Height 11/29/23 1649 5\' 5"  (1.651 m)     Head Circumference --      Peak Flow --      Pain Score 11/29/23 1649 10     Pain Loc --      Pain Education --      Exclude from Growth Chart --     Most recent vital signs: Vitals:   11/29/23 1639 11/29/23 2144  BP: (!) 184/83 (!) 188/82  Pulse: 79 76  Resp: 18 18  Temp: 98 F (36.7 C) 98.2 F (36.8 C)  SpO2: 100% 100%    General Awake, no distress. NAD HEENT NCAT except for a large deep linear laceration over the right brow. PERRL. EOMI. No rhinorrhea. Mucous membranes are moist.  CV:  Good peripheral perfusion. RRR RESP:  Normal effort. CTA ABD:  No distention.  NEURO: Cranial nerves II to XII grossly intact.   ED Results / Procedures / Treatments   Labs (all labs ordered are listed, but only abnormal results are displayed) Labs Reviewed - No data to display   EKG   RADIOLOGY  I personally viewed and evaluated these images as part of my medical decision making, as well as reviewing the written report  by the radiologist.  ED Provider Interpretation: No acute findings  CT Head w/o CM   IMPRESSION: 1. No acute intracranial abnormality. 2. Generalized cerebral atrophy with chronic white matter small vessel ischemic changes. 3. Superficial right supra orbital soft tissue defect with a mild amount of associated soft tissue swelling.  PROCEDURES:  Critical Care performed: No  .Laceration Repair  Date/Time: 11/29/2023 8:45 PM  Performed by: Lissa Hoard, PA-C Authorized by: Lissa Hoard, PA-C   Consent:    Consent obtained:  Verbal   Consent given by:  Patient   Risks, benefits, and alternatives were discussed: yes     Risks discussed:  Pain and poor wound healing Universal protocol:    Imaging studies available: yes     Site/side marked: yes     Patient identity confirmed:  Verbally with patient Anesthesia:    Anesthesia method:  Topical application and local infiltration   Topical anesthetic:  LET   Local anesthetic:  Lidocaine 2% WITH epi Laceration details:    Location:  Face   Face location:  Forehead   Length (cm):  4   Depth (mm):  5 Pre-procedure details:    Preparation:  Patient was prepped and draped in usual sterile fashion Exploration:    Limited defect created (wound extended): no     Hemostasis achieved with:  Direct pressure and LET   Contaminated: no   Treatment:    Area cleansed with:  Povidone-iodine and saline   Amount of cleaning:  Standard   Irrigation solution:  Sterile saline   Irrigation volume:  10   Irrigation method:  Syringe   Debridement:  None   Undermining:  None   Scar revision: no     Layers/structures repaired:  Deep subcutaneous Deep subcutaneous:    Suture size:  5-0   Suture material:  Vicryl   Suture technique:  Running   Number of sutures:  1 Skin repair:    Repair method:  Tissue adhesive Approximation:    Approximation:  Close Repair type:    Repair type:  Intermediate Post-procedure details:     Dressing:  Open (no dressing)   Procedure completion:  Tolerated well, no immediate complications    MEDICATIONS ORDERED IN ED: Medications  HYDROcodone-acetaminophen (NORCO/VICODIN) 5-325 MG per tablet 1 tablet (1 tablet Oral Given 11/29/23 1655)  ondansetron (ZOFRAN-ODT) disintegrating tablet 4 mg (4 mg Oral Given 11/29/23 1655)     IMPRESSION / MDM / ASSESSMENT AND PLAN / ED COURSE  I reviewed the triage vital signs and the nursing notes.                              Differential diagnosis includes, but is not limited to, patient contusion, SDH, concussion, facial laceration, abrasion, hematoma  Patient's presentation is most consistent with acute complicated illness / injury requiring diagnostic workup.  Patient's diagnosis is consistent with facial injury resulting in a large laceration over the right brow.  Patient with recent exam and workup.  No reports of any LOC or concussion following the incident.  CT imaging reviewed by me negative for any acute intracranial process.  Patient consents to wound repair which is performed using a 2 layer closure.  Good wound edge approximation is achieved using topical subcu stitch and Dermabond to approximate the dermal edges.  Patient will be discharged home with prescriptions for Zofran and ondansetron. Patient is to follow up with her PCP as discussed, as needed or otherwise directed. Patient is given ED precautions to return to the ED for any worsening or new symptoms.   FINAL CLINICAL IMPRESSION(S) / ED DIAGNOSES   Final diagnoses:  Facial laceration, initial encounter  Injury of head, initial encounter     Rx / DC Orders   ED Discharge Orders          Ordered    ondansetron (ZOFRAN-ODT) 4 MG disintegrating tablet  Every 8 hours PRN        11/29/23 2040    HYDROcodone-acetaminophen (NORCO) 5-325 MG tablet  3 times daily PRN        11/29/23 2040             Note:  This document was prepared using Dragon voice  recognition software and may include unintentional dictation errors.    Lissa Hoard, PA-C 12/04/23 1648    Dionne Bucy, MD 12/04/23 2256

## 2023-12-05 ENCOUNTER — Other Ambulatory Visit (INDEPENDENT_AMBULATORY_CARE_PROVIDER_SITE_OTHER): Payer: Self-pay | Admitting: Nurse Practitioner

## 2023-12-05 ENCOUNTER — Telehealth (INDEPENDENT_AMBULATORY_CARE_PROVIDER_SITE_OTHER): Payer: Self-pay

## 2023-12-05 MED ORDER — TRAMADOL HCL 50 MG PO TABS: 50.0000 mg | ORAL_TABLET | Freq: Four times a day (QID) | ORAL | 0 refills | Status: DC | PRN

## 2023-12-05 NOTE — Telephone Encounter (Signed)
She can use warm compresses

## 2023-12-05 NOTE — Telephone Encounter (Signed)
Message given and Rx picked up

## 2023-12-05 NOTE — Telephone Encounter (Signed)
Well, she can try some ibuprofen in addition to the tylenol.  Unfortunately as far as pain is concerned, there's not a lot of non-narcotic pain medication.  We could try tramadol which is a very low, mild opoid and is typically not habit forming if taken for a short time

## 2023-12-05 NOTE — Telephone Encounter (Signed)
Patient had surgery Monday, she stated she is in pain and this is the worst day. She is nauseous as well but has meds for that. She has been taking 3 500 mg Tylenol at one time and it's not touching the pain. She stated the same procedure she had last year was just as painful, except this time she can't get the pain under control.   Patient DOES NOT WANT OPIOIDS or anything that can cause her to be addicted.   Please advise

## 2023-12-13 ENCOUNTER — Other Ambulatory Visit: Payer: Self-pay | Admitting: Family Medicine

## 2023-12-13 DIAGNOSIS — I10 Essential (primary) hypertension: Secondary | ICD-10-CM

## 2023-12-31 ENCOUNTER — Encounter: Payer: Self-pay | Admitting: Family Medicine

## 2023-12-31 ENCOUNTER — Telehealth: Payer: Self-pay | Admitting: Family Medicine

## 2023-12-31 ENCOUNTER — Other Ambulatory Visit: Payer: Self-pay | Admitting: Family Medicine

## 2023-12-31 ENCOUNTER — Ambulatory Visit (INDEPENDENT_AMBULATORY_CARE_PROVIDER_SITE_OTHER): Payer: Self-pay | Admitting: Family Medicine

## 2023-12-31 VITALS — BP 144/80 | HR 73 | Temp 98.8°F | Ht 65.0 in | Wt 194.0 lb

## 2023-12-31 DIAGNOSIS — E66811 Obesity, class 1: Secondary | ICD-10-CM

## 2023-12-31 DIAGNOSIS — E1142 Type 2 diabetes mellitus with diabetic polyneuropathy: Secondary | ICD-10-CM

## 2023-12-31 DIAGNOSIS — E871 Hypo-osmolality and hyponatremia: Secondary | ICD-10-CM

## 2023-12-31 DIAGNOSIS — E785 Hyperlipidemia, unspecified: Secondary | ICD-10-CM

## 2023-12-31 DIAGNOSIS — Z794 Long term (current) use of insulin: Secondary | ICD-10-CM

## 2023-12-31 DIAGNOSIS — E039 Hypothyroidism, unspecified: Secondary | ICD-10-CM

## 2023-12-31 DIAGNOSIS — G63 Polyneuropathy in diseases classified elsewhere: Secondary | ICD-10-CM

## 2023-12-31 DIAGNOSIS — G629 Polyneuropathy, unspecified: Secondary | ICD-10-CM | POA: Insufficient documentation

## 2023-12-31 DIAGNOSIS — E1169 Type 2 diabetes mellitus with other specified complication: Secondary | ICD-10-CM

## 2023-12-31 DIAGNOSIS — Z7984 Long term (current) use of oral hypoglycemic drugs: Secondary | ICD-10-CM

## 2023-12-31 DIAGNOSIS — I10 Essential (primary) hypertension: Secondary | ICD-10-CM

## 2023-12-31 LAB — POCT GLYCOSYLATED HEMOGLOBIN (HGB A1C): Hemoglobin A1C: 10 % — AB (ref 4.0–5.6)

## 2023-12-31 MED ORDER — GABAPENTIN 100 MG PO CAPS: 100.0000 mg | ORAL_CAPSULE | Freq: Two times a day (BID) | ORAL | 1 refills | Status: DC

## 2023-12-31 MED ORDER — ROSUVASTATIN CALCIUM 20 MG PO TABS: 20.0000 mg | ORAL_TABLET | Freq: Every day | ORAL | 3 refills | Status: AC

## 2023-12-31 NOTE — Assessment & Plan Note (Signed)
 Disc goals for lipids and reasons to control them Rev last labs with pt Rev low sat fat diet in detail With vasc dz Goal is LDL under 70  Increase crestor to 20 mg daily today

## 2023-12-31 NOTE — Assessment & Plan Note (Signed)
 Re check cmet today

## 2023-12-31 NOTE — Patient Instructions (Addendum)
 Stay active  Keep walking  Add some strength training to your routine, this is important for bone and brain health and can reduce your risk of falls and help your body use insulin properly and regulate weight  Light weights, exercise bands , and internet videos are a good way to start  Yoga (chair or regular), machines , floor exercises or a gym with machines are also good options    Go up on crestor from 10 to 20 mg daily  Call us if you have any side effects   Call us with some blood pressure readings from home  Blood pressure is mildly elevated today   Try the gabapentin 100 mg twice daily  It can be a little sedating -this does not tend to last   Labs today  I may go up on metformin a bit more if labs look good   Schedule a follow up with Lillia Abed if you don't have an appointment scheduled    Keep working on diabetic diet and exercise

## 2023-12-31 NOTE — Assessment & Plan Note (Signed)
 Lab Results  Component Value Date   HGBA1C 10.0 (A) 12/31/2023   HGBA1C 10.2 (A) 10/02/2023   HGBA1C 11.7 (A) 06/26/2023   Pt is working hard on diet  Discussed adding strength building exercise  Will schedule follow up with pharmacy   Glipizide xl 10 mg daily  Lantus 54 u daily  Metformin xr 1000 and 500 pm = will do bmet today and if ok consider increase to 2000 bid  Neuropathy is worse  Did have re vasc procedures both feet/legs   Will have medicare in a year and be able possibly to afford higher cost meds

## 2023-12-31 NOTE — Assessment & Plan Note (Signed)
 Blood pressure is up  BP: (!) 144/80  Pt will call with home readings that are better   Amlodipine 5 mg daily  Lisinopril 40 mg daily  Metoprolol 50 mg bid   Encouraged to eat less sodium/ processed foods Follow up 2-3 wk

## 2023-12-31 NOTE — Assessment & Plan Note (Signed)
 Likely due to diabetes  No improvement after re vascularization   Will try gabapentin 100 mg bid Caution of sedation / dizziness while getting used to it  Encouraged to update next week with response  Consider increase dose with time if helpful   Most importantly goal is to better control DM2

## 2023-12-31 NOTE — Progress Notes (Signed)
 Subjective:    Patient ID: Pam Patel, female    DOB: [birth year], 63 y.o.   MRN: [id number]  HPI  Wt Readings from Last 3 Encounters:  12/31/23 194 lb (88 kg)  12/03/23 198 lb 8 oz (90 kg)  11/29/23 184 lb (83.5 kg)   32.28 kg/m  Vitals:   12/31/23 1354 12/31/23 1430  BP: (!) 152/90 (!) 144/80  Pulse: 73   Temp: 98.8 F (37.1 C)   SpO2: 96%     Pt presents for follow up of DM2 with chronic medical problems including HTN and hyperlipidemia and stress reaction     HTN bp is stable today  No cp or palpitations or headaches or edema  No side effects to medicines  BP Readings from Last 3 Encounters:  12/31/23 (!) 144/80  12/03/23 (!) 153/72  11/29/23 (!) 188/82    Amlodipine 5 mg daily  Lisinopril 40 mg daily  Metoprolol 50 mg bid   Blood pressure is up at home   Pulse Readings from Last 3 Encounters:  12/31/23 73  12/03/23 65  11/29/23 76   Lab Results  Component Value Date   NA 132 (L) 05/08/2023   K 4.5 05/08/2023   CO2 25 05/08/2023   GLUCOSE 330 (H) 05/08/2023   BUN 14 12/03/2023   CREATININE 0.59 12/03/2023   CALCIUM 10.9 (H) 05/08/2023   GFR 80.85 05/08/2023   GFRNONAA >60 12/03/2023     DM2 Lab Results  Component Value Date   HGBA1C 10.0 (A) 12/31/2023   HGBA1C 10.2 (A) 10/02/2023   HGBA1C 11.7 (A) 06/26/2023   Aa1c is 10.0 today    Seeing pharmacy for help with management  Metformin xr 1000 mg daily in am and 500 in pm (considering increase to 2000 bid in future) GI system is ok now Rene Kocher not metformin (it was high fat/ fast food with diet drinks)    Glipizide xl 10 mg daily  Insulin glargine 54 u daily (back on lantus)   Unable to afford GLP and some other meds due to cost   Neuropathy was worse last time   Discussed emotional eating at last visit   Now=not eating fast food  Drinks tea with sweetener  No sugar drinks  Overall eating much better  No longer eating rice / some cauliflower rice now  More fruit instead of  sweets (no sweets at all)  Cut out bread    Exercise  Now finally able to start walking    Has stopped getting the really high numbers  Now staying under 200    Hyperlipidemia Lab Results  Component Value Date   CHOL 136 05/08/2023   HDL 32.00 (L) 05/08/2023   LDLCALC 86 12/02/2019   LDLDIRECT 72.0 05/08/2023   TRIG 206.0 (H) 05/08/2023   CHOLHDL 4 05/08/2023   Has vascular dz and DM2  Tolerates crestor 10 mg daily Cardiac ca score ordered last time Was getting surgery for PAD as well -still taking plavix   Had her 2nd procedure on other leg- was laid up in bed and could not eat  Had to do protein shakes  2 stents in calves and one in left thigh   Smaller portion sizes    Patient Active Problem List   Diagnosis Date Noted   Peripheral neuropathy 12/31/2023   PAD (peripheral artery disease) (HCC) 10/02/2023   Hyponatremia 06/26/2023   Does not have health insurance 05/08/2023   Atherosclerosis of native arteries of extremity with intermittent claudication (  HCC) 03/26/2023   Poor compliance 03/26/2023   Obesity (BMI 30.0-34.9) 08/08/2020   Hot flashes 08/28/2018   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   Dental disease 03/07/2018   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   Lung nodule 03/07/2018   Hypertension 03/06/2018   Panic disorder 03/06/2018   Thyromegaly 03/06/2018   DM type 2 with diabetic peripheral neuropathy (HCC) 03/06/2018   Hypothyroidism 10/05/2008   STRESS REACTION, ACUTE, WITH EMOTIONAL DISTURBANCE 10/05/2008   Hyperlipidemia associated with type 2 diabetes mellitus (HCC) 01/15/2008   Former smoker 01/15/2008   ALLERGIC RHINITIS, SEASONAL 01/15/2008   DEGENERATIVE DISC DISEASE, 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   Past Medical History:  Diagnosis Date   History of chickenpox    History of diabetes mellitus, type II    History of hypertension    Hypertension    Past Surgical History:  Procedure Laterality Date   APPENDECTOMY  1990   BACK SURGERY     LOWER  EXTREMITY ANGIOGRAPHY Right 07/26/2023   Procedure: Lower Extremity Angiography;  Surgeon: Annice Needy, MD;  Location: [facility] INVASIVE CV [facility];  Service: Cardiovascular;  Laterality: Right;   LOWER EXTREMITY ANGIOGRAPHY Left 12/03/2023   Procedure: Lower Extremity Angiography;  Surgeon: Annice Needy, MD;  Location: [facility] INVASIVE CV LAB;  Service: Cardiovascular;  Laterality: Left;   Social History   Tobacco Use   Smoking status: Former   Smokeless tobacco: Never  Vaping Use   Vaping status: Every Day  Substance Use Topics   Alcohol use: Not Currently   Drug use: Never   Family History  Problem Relation Age of Onset   COPD Mother    Diabetes Mother    Heart attack Mother    Heart disease Mother    Hypertension Mother    Stroke Mother    Heart attack Father    Allergies  Allergen Reactions   Penicillins Hives    Has patient had a PCN reaction causing immediate rash, facial/tongue/throat swelling, SOB or lightheadedness with hypotension: Unknown Has patient had a PCN reaction causing severe rash involving mucus membranes or skin necrosis: No Has patient had a PCN reaction that required hospitalization: No Has patient had a PCN reaction occurring within the last 10 years: No If all of the above answers are "NO", then may proceed with Cephalosporin use.    Naproxen     REACTION: hives   Current Outpatient Medications on File Prior to Visit  Medication Sig Dispense Refill   amLODipine (NORVASC) 5 MG tablet Take 1 tablet (5 mg total) by mouth daily. 90 tablet 2   aspirin EC 81 MG tablet Take 1 tablet (81 mg total) by mouth daily. 30 tablet 11   BD INSULIN SYRINGE U/F 30G X 1/2" 0.5 ML MISC USE WITH INSULIN ONCE DAILY 100 each 1   blood glucose meter kit and supplies KIT Dispense based on patient and insurance preference. Use up to four times daily as directed. (FOR ICD-9 250.00, 250.01). 1 each 0   clopidogrel (PLAVIX) 75 MG tablet Take 1 tablet (75 mg total) by mouth daily. 30  tablet 11   glipiZIDE (GLUCOTROL XL) 10 MG 24 hr tablet Take 1 tablet (10 mg total) by mouth daily with breakfast. 30 tablet 6   Insulin Glargine (BASAGLAR KWIKPEN) 100 UNIT/ML Inject 54 Units into the skin daily. 15 mL 2   insulin glargine (LANTUS) 100 UNIT/ML injection Inject 0.5 mLs (50 Units total) into the skin daily. 15 mL 2   insulin glargine-yfgn (SEMGLEE) 100 UNIT/ML injection  Inject into the skin daily.     Insulin Pen Needle (PEN NEEDLES) 31G X 5 MM MISC Use to inject insulin once daily 100 each 4   levothyroxine (SYNTHROID) 75 MCG tablet Take 1 tablet (75 mcg total) by mouth daily before breakfast. 90 tablet 1   lisinopril (ZESTRIL) 40 MG tablet Take 1 tablet (40 mg total) by mouth daily. 90 tablet 1   metFORMIN (GLUCOPHAGE-XR) 500 MG 24 hr tablet Take 2 tablets (1,000 mg total) by mouth every morning AND 1 tablet (500 mg total) every evening. 270 tablet 0   metoprolol tartrate (LOPRESSOR) 50 MG tablet TAKE 1 TABLET BY MOUTH TWICE A DAY 180 tablet 0   ondansetron (ZOFRAN-ODT) 4 MG disintegrating tablet Take 1 tablet (4 mg total) by mouth every 8 (eight) hours as needed for nausea or vomiting. 15 tablet 0   PARoxetine (PAXIL) 20 MG tablet TAKE 1 TABLET BY MOUTH EVERY DAY 90 tablet 0   traMADol (ULTRAM) 50 MG tablet Take 1 tablet (50 mg total) by mouth every 6 (six) hours as needed. 20 tablet 0   No current facility-administered medications on file prior to visit.    Review of Systems  Constitutional:  Negative for activity change, appetite change, fatigue, fever and unexpected weight change.  HENT:  Negative for congestion, ear pain, rhinorrhea, sinus pressure and sore throat.   Eyes:  Negative for pain, redness and visual disturbance.  Respiratory:  Negative for cough, shortness of breath and wheezing.   Cardiovascular:  Negative for chest pain and palpitations.       Vascular surgery was very hard on her   Gastrointestinal:  Negative for abdominal pain, blood in stool,  constipation and diarrhea.  Endocrine: Negative for polydipsia and polyuria.  Genitourinary:  Negative for dysuria, frequency and urgency.  Musculoskeletal:  Negative for arthralgias, back pain and myalgias.  Skin:  Negative for pallor and rash.  Allergic/Immunologic: Negative for environmental allergies.  Neurological:  Negative for dizziness, syncope and headaches.  Hematological:  Negative for adenopathy. Does not bruise/bleed easily.  Psychiatric/Behavioral:  Negative for decreased concentration and dysphoric mood. The patient is not nervous/anxious.        Objective:   Physical Exam Constitutional:      General: She is not in acute distress.    Appearance: Normal appearance. She is well-developed. She is obese. She is not ill-appearing or diaphoretic.  HENT:     Head: Normocephalic and atraumatic.     Mouth/Throat:     Mouth: Mucous membranes are moist.  Eyes:     Conjunctiva/sclera: Conjunctivae normal.     Pupils: Pupils are equal, round, and reactive to light.  Neck:     Thyroid: No thyromegaly.     Vascular: No carotid bruit or JVD.  Cardiovascular:     Rate and Rhythm: Normal rate and regular rhythm.     Heart sounds: Normal heart sounds.     No gallop.  Pulmonary:     Effort: Pulmonary effort is normal. No respiratory distress.     Breath sounds: Normal breath sounds. No wheezing or rales.  Abdominal:     General: There is no distension or abdominal bruit.     Palpations: Abdomen is soft.     Tenderness: There is no abdominal tenderness.  Musculoskeletal:     Cervical back: Normal range of motion and neck supple.     Right lower leg: No edema.     Left lower leg: No edema.  Lymphadenopathy:  Cervical: No cervical adenopathy.  Skin:    General: Skin is warm and dry.     Coloration: Skin is not pale.     Findings: No rash.  Neurological:     Mental Status: She is alert.     Sensory: Sensory deficit present.     Coordination: Coordination normal.      Deep Tendon Reflexes: Reflexes are normal and symmetric. Reflexes normal.     Comments: Altered sensation in feet   Psychiatric:        Mood and Affect: Mood normal.           Assessment & Plan:   Problem List Items Addressed This Visit       Cardiovascular and Mediastinum   Hypertension   Blood pressure is up  BP: (!) 144/80  Pt will call with home readings that are better   Amlodipine 5 mg daily  Lisinopril 40 mg daily  Metoprolol 50 mg bid   Encouraged to eat less sodium/ processed foods Follow up 2-3 wk       Relevant Medications   rosuvastatin (CRESTOR) 20 MG tablet   Other Relevant Orders   Comprehensive metabolic panel     Endocrine   Hypothyroidism   Hypothyroidism  Pt has no clinical changes No change in energy level/ hair or skin/ edema and no tremor Lab Results  Component Value Date   TSH 2.86 03/26/2023    Continues levothyroxine 75 mcg daily      Hyperlipidemia associated with type 2 diabetes mellitus (HCC)   Disc goals for lipids and reasons to control them Rev last labs with pt Rev low sat fat diet in detail With vasc dz Goal is LDL under 70  Increase crestor to 20 mg daily today      Relevant Medications   rosuvastatin (CRESTOR) 20 MG tablet   DM type 2 with diabetic peripheral neuropathy (HCC) - Primary   Lab Results  Component Value Date   HGBA1C 10.0 (A) 12/31/2023   HGBA1C 10.2 (A) 10/02/2023   HGBA1C 11.7 (A) 06/26/2023   Pt is working hard on diet  Discussed adding strength building exercise  Will schedule follow up with pharmacy   Glipizide xl 10 mg daily  Lantus 54 u daily  Metformin xr 1000 and 500 pm = will do bmet today and if ok consider increase to 2000 bid  Neuropathy is worse  Did have re vasc procedures both feet/legs   Will have medicare in a year and be able possibly to afford higher cost meds        Relevant Medications   rosuvastatin (CRESTOR) 20 MG tablet   gabapentin (NEURONTIN) 100 MG capsule    Other Relevant Orders   POCT HgB A1C (Completed)     Nervous and Auditory   Peripheral neuropathy   Likely due to diabetes  No improvement after re vascularization   Will try gabapentin 100 mg bid Caution of sedation / dizziness while getting used to it  Encouraged to update next week with response  Consider increase dose with time if helpful   Most importantly goal is to better control DM2        Relevant Medications   gabapentin (NEURONTIN) 100 MG capsule     Other   Obesity (BMI 30.0-34.9)   Discussed how this problem influences overall health and the risks it imposes  Reviewed plan for weight loss with lower calorie diet (via better food choices (lower glycemic and portion control) along with  exercise building up to or more than 30 minutes 5 days per week including some aerobic activity and strength training   Has improved eating  Discussed options for strength building exercise       Hyponatremia   Re check cmet today

## 2023-12-31 NOTE — Assessment & Plan Note (Signed)
 Discussed how this problem influences overall health and the risks it imposes  Reviewed plan for weight loss with lower calorie diet (via better food choices (lower glycemic and portion control) along with exercise building up to or more than 30 minutes 5 days per week including some aerobic activity and strength training   Has improved eating  Discussed options for strength building exercise

## 2023-12-31 NOTE — Telephone Encounter (Signed)
 Provider request to Schedule pharmacy appointment with Pam Patel please

## 2023-12-31 NOTE — Assessment & Plan Note (Signed)
 Hypothyroidism  Pt has no clinical changes No change in energy level/ hair or skin/ edema and no tremor Lab Results  Component Value Date   TSH 2.86 03/26/2023    Continues levothyroxine 75 mcg daily

## 2024-01-01 ENCOUNTER — Encounter: Payer: Self-pay | Admitting: Family Medicine

## 2024-01-01 LAB — COMPREHENSIVE METABOLIC PANEL
ALT: 11 U/L (ref 0–35)
AST: 17 U/L (ref 0–37)
Albumin: 4 g/dL (ref 3.5–5.2)
Alkaline Phosphatase: 76 U/L (ref 39–117)
BUN: 20 mg/dL (ref 6–23)
CO2: 25 meq/L (ref 19–32)
Calcium: 10.2 mg/dL (ref 8.4–10.5)
Chloride: 99 meq/L (ref 96–112)
Creatinine, Ser: 0.81 mg/dL (ref 0.40–1.20)
GFR: 76.92 mL/min (ref >60.00)
Glucose, Bld: 284 mg/dL — ABNORMAL HIGH (ref 70–99)
Potassium: 4.5 meq/L (ref 3.5–5.1)
Sodium: 134 meq/L — ABNORMAL LOW (ref 135–145)
Total Bilirubin: 0.5 mg/dL (ref 0.2–1.2)
Total Protein: 7.6 g/dL (ref 6.0–8.3)

## 2024-01-01 MED ORDER — METFORMIN HCL ER 500 MG PO TB24
ORAL_TABLET | ORAL | 0 refills | Status: DC
  Filled 2024-01-01: qty 360, 90d supply, fill #0

## 2024-01-01 NOTE — Addendum Note (Signed)
 Addended by: Roxy Manns A on: 01/01/2024 07:54 PM   Modules accepted: Orders

## 2024-01-02 ENCOUNTER — Other Ambulatory Visit: Payer: Self-pay

## 2024-01-07 ENCOUNTER — Other Ambulatory Visit (INDEPENDENT_AMBULATORY_CARE_PROVIDER_SITE_OTHER): Payer: Self-pay | Admitting: Vascular Surgery

## 2024-01-07 ENCOUNTER — Other Ambulatory Visit: Payer: Self-pay | Admitting: Pharmacist

## 2024-01-07 DIAGNOSIS — Z9889 Other specified postprocedural states: Secondary | ICD-10-CM

## 2024-01-07 NOTE — Progress Notes (Signed)
 No Show

## 2024-01-08 ENCOUNTER — Ambulatory Visit (INDEPENDENT_AMBULATORY_CARE_PROVIDER_SITE_OTHER): Payer: Self-pay | Admitting: Nurse Practitioner

## 2024-01-08 ENCOUNTER — Encounter (INDEPENDENT_AMBULATORY_CARE_PROVIDER_SITE_OTHER): Payer: Self-pay

## 2024-01-09 ENCOUNTER — Other Ambulatory Visit: Payer: Self-pay

## 2024-01-09 ENCOUNTER — Encounter: Payer: Self-pay | Admitting: Pharmacist

## 2024-01-09 NOTE — Progress Notes (Signed)
 Attempted to contact patient for scheduled appointment for medication management. Unable to leave message, voicemail is not set up.

## 2024-01-09 NOTE — Progress Notes (Deleted)
 01/09/2024 Name: Pam Patel MRN: [id number] DOB: Mar 28, 1961  Subjective  No chief complaint on file.  Reason for visit: ?  Pam Patel is a 63 y.o. female with a history of diabetes (type 2), who presents today for an initial diabetes pharmacotherapy visit.? Pertinent PMH also includes HTN, PAD w claudication, HLD, hypothyroid.  Known DM Complications:  Significant PAD/claudication s/p revascularization RLE    Care Team: Primary Care Provider: Judy Pimple, MD   Date of Last Diabetes Related Visit: with PCP on 2/24  ; 12/5 with PharmD Recent Summary of Change: 2/24: ?? rosuvastatin 20 mg; A1c 10%, unchanged from prev.  12/5: ?? glargine 54u (8% inc)  Medication Access/Adherence: Prescription drug coverage: Payor: / No coverage found. Reports not having insurance since ~2006 when husband's job stopped offering coverage. [facility] (all medication x90 days ~$500 total). Patient income = [place] $900, husband still working income $100k annually.  - Current Patient Assistance: None. Household of 2, income ~$111,000 yearly.  - Patient income = [place] $900, husband still working income $100k annually.   Since Last visit / History of Present Illness: ?  Patient reports implementing plan from last visit. Denies issues with insulin titration. Does feel sugars continue to get a little better with each titration. Denies adverse effects with current medications. Tolerating metformin XR well. Reports severe diarrhea seldomly which she has identified as being related to artificial sweeteners. ~One episode every few weeks only following sugar-free items (Such as Diet Coke).   Reported DM Regimen: ?  glipizide XL 10 mg daily before breakfast (~$49/90 days) metformin ER 500 mg tablet; 1000 mg AM, 500 mg PM (~$49/90 days) Glargine vial 54*** units daily at night (Lantus ~$200/90 days)   DM medications tried in the past:?  N/A  SMBG Per patient memory: ? Checks morning, mid-day,  late afternoon (glucometer) FBG since increasing insulin last: 147 (usally 140s-150s, sometimes lower. Lowest reading 126 mg/dL) After lunch (~1 hour): 193 After dinner (~1-2 hor): 291  Hypo/Hyperglycemia: ?  Symptoms of hypoglycemia since last visit:? no  If yes, it was treated by: n/a  Symptoms of hyperglycemia since last visit:? yes -  Can feel when BG is elevated, dizzy, brain fog, sleepy  Exercise: Limited w severe PAD, recent surgery  Diet: Overall eating much better since previous visit Has stopped eating fast food, sugary drinks, rice, bread. Started drinking tea with sugar-free sweetener *** More fruit instead of sweets (no sweets at all)   DM Prevention:  Statin: Taking; moderate intensity.?  History of chronic kidney disease? no History of albuminuria? yes, last UACR on 10/02/23 = 112 mg/g ACE/ARB - Taking lisinopril 40 mg daily; Urine MA/CR Ratio - elevated urinary albumin excretion.  Last eye exam: Unclear; DUE Last foot exam: 05/08/2023 Tobacco Use: Former smoker  Immunizations:? Flu: Due (Last: 10/23/2019); Pneumococcal: No Record - DUE - <65 with Diabetes; Shingrix: No Record - DUE; Covid (No record)  Cardiovascular Risk Reduction History of clinical ASCVD? yes. Severe PAD bilateral s/p revascularization History of heart failure? no  History of hyperlipidemia? yes Current BMI: 31.6 kg/m2 (Ht 66 in, Wt 89 kg) Taking statin? yes; moderate intensity (rosuvastatin 10) Taking aspirin? Not taking  while on Plavix s/p revascularization (leg) Taking SGLT-2i? no Taking GLP- 1 RA? no   Reported HTN Regimen: ?  Amlodipine 5 mg daily Lisinopril 40 mg daily Metoprolol tartrate 50 mg twice daily  Patient is not checking their blood pressure at home regularly.  ***  Patient denies hypotensive s/sx. No dizziness, lightheadedness.  Patient denies hypertensive symptoms. No headache, chest pain, shortness of breath, visual changes.       _______________________________________________  Objective    Review of Systems:? Limited in the setting of virtual visit  Constitutional:? No fever, chills or unintentional weight loss  Cardiovascular:? No chest pain or pressure, shortness of breath, dyspnea on exertion, orthopnea or LE edema  Pulmonary:? No cough or shortness of breath  GI:? No nausea, vomiting, constipation, diarrhea, abdominal pain, dyspepsia, change in bowel habits  Endocrine:? No polyuria, polyphagia or blurred vision    Physical Examination:  Vitals:  Wt Readings from Last 3 Encounters:  12/31/23 194 lb (88 kg)  12/03/23 198 lb 8 oz (90 kg)  11/29/23 184 lb (83.5 kg)   BP Readings from Last 3 Encounters:  12/31/23 (!) 144/80  12/03/23 (!) 153/72  11/29/23 (!) 188/82   Pulse Readings from Last 3 Encounters:  12/31/23 73  12/03/23 65  11/29/23 76     Labs:?  Lab Results  Component Value Date   HGBA1C 10.0 (A) 12/31/2023   HGBA1C 10.2 (A) 10/02/2023   HGBA1C 11.7 (A) 06/26/2023   GLUCOSE 284 (H) 12/31/2023   MICRALBCREAT 11.2 10/02/2023   CREATININE 0.81 12/31/2023   CREATININE 0.59 12/03/2023   CREATININE 0.60 07/26/2023   GFR 76.92 12/31/2023   GFR 80.85 05/08/2023   GFR 83.48 03/26/2023    Lab Results  Component Value Date   CHOL 136 05/08/2023   LDLCALC 86 12/02/2019   LDLCALC 125 (H) 05/27/2018   LDLCALC 83 02/21/2018   LDLDIRECT 72.0 05/08/2023   LDLDIRECT 165.0 03/26/2023   HDL 32.00 (L) 05/08/2023   TRIG 206.0 (H) 05/08/2023   TRIG 281.0 (H) 03/26/2023   TRIG (H) 12/19/2021    499.0 Triglyceride is over 400; calculations on Lipids are invalid.   ALT 11 12/31/2023   ALT 31 03/26/2023   AST 17 12/31/2023   AST 37 03/26/2023      Chemistry      Component Value Date/Time   NA 134 (L) 12/31/2023 1441   K 4.5 12/31/2023 1441   CL 99 12/31/2023 1441   CO2 25 12/31/2023 1441   BUN 20 12/31/2023 1441   CREATININE 0.81 12/31/2023 1441      Component Value Date/Time    CALCIUM 10.2 12/31/2023 1441   ALKPHOS 76 12/31/2023 1441   AST 17 12/31/2023 1441   ALT 11 12/31/2023 1441   BILITOT 0.5 12/31/2023 1441     The 10-year ASCVD risk score (Arnett DK, et al., 2019) is: 16.2%  Assessment and Plan:   1. Diabetes, type 2: uncontrolled per last A1c of 10% (2/24), relatively unchanged from previous 10.1%. Goal <7% without hypoglycemia. UACR elevated >100 mg/g (09/2023) despite adherence to RAASi. SGLT2i has been cost-prohibitive d/t no insurance.   Glucometer data shows fasting sugars typically around upper 140s, lowest reading 126 mg/dL. Progressively climb through day with after-dinner sugars >200 mg/dL.  Currently paying almost $200/90 days of insulin (not including needles). Discussed Lilly insulin value program. Agreeable to transition from vial to pens (see below). Will continue vial until gone (2-3 weeks). Prefers in-person injection training.  Current Regimen: Metformin XR 1000/500 mg am/pm, glargine (vial/syringe) 50 units daily (increased >1 week ago), glipizide XL 10 mg AM. Increase glargine 54?*** units daily (***% increase) Increase Metformin XR 1000 mg twice daily Injection training 2 weeks (vials?pens) Reviewed s/sx/tx hypoglycemia Future Consideration: GLP1-RA: Ideal agent though cost-prohibitive given no insurance and  high income.  SGLT2i: Ideal agent in the setting of albuminuria though cost-prohibitive Metformin:  Tolerating XR formulation well. Has identified source of GI upset. Increase to 2000 mg BID at follow up if amenable   TZD: Avoiding due to possible weight gain/increase in fracture risk with only moderate A1c-reduction.    2. Medication Cost: Household of 2, income ~$111,000 yearly. Does not qualify for PAP. [facility] (note, patient reports prices are reasonable for her) though these medications should be cheaper elsewhere.   Of [employer], patient prefers [place]. Declines mail order for  now. Would like all medications at [facility] including those that are cheaper elsewhere (as below).  Rx refills sent to [facility] for all PCP-prescribed meds (except insulin and rosuvastatin). New eRx for [employer] (glargine) + pen needles   [facility]  [facility]  metformin XR  ~$49/90 days $5/month   glipizide XL 10 mg Glipizide XL 5 mg  ~$49/90 days    -- $15/month $5/month fo5mg  $15/90 days  insulin glargine vial  ~$200/90 days  -- --  Insulin syringe/needle  >$100/90 days -- --  Brenzavvy (SGLT2i)   $50/month  Metoprolol tartrate  $10/month $6.20/month  Lisinopril  $5/month   Amlodipine 5  $5/month $6.80 / 90days  Paroxetine  $5/month   Rosuvastatin  $5/month   Levothyroxine 75 mcg  $15/month $8.60 / 90 days    LillyInsulin Value Program: [employer] (as low as $35/month for uninsured)    3. ASCVD (secondary prevention, clinical symptomatic PAD s/p revascularization procedure): Uncontrolled though improved on last lipid panel with LDL 165?72 mg/dL, TG 161?096 mg/dL (May?Jul 0454). LDL goal  <55 mg/dL per symptomatic ASCVD and DM with several risk factors . Statin increased at last PCP visit, 2/24.  Current Regimen: rosuvastatin 20 mg daily Repeat lipid panel >4-12 weeks s/p statin increase (2/24)   4. HTN: Uncontrolled based on last clinic BP of 153/72 mmHg, goal <130/80 mmHg. Does not monitor BP at home. Denies lightheadedness, dizziness, SOB, CP, vision changes.  Current Regimen: amlodipine 5 mg qd, lisinopril 40 mg qd, metoprolol tartrate 50 mg BID Continue medications without changes.   Follow Up Follow up with clinical pharmacist via office visit for injection training in 2 weeks Patient given direct line for questions regarding medication therapy and cost  Future Appointments  Date Time Provider Department Center  01/09/2024  1:00 PM [facility]CM PHARMACIST [facility]  01/15/2024  2:30 PM Tower, Audrie Gallus, MD [facility]     Loree Fee, PharmD Clinical Pharmacist [facility] [phone]

## 2024-01-10 ENCOUNTER — Other Ambulatory Visit: Payer: Self-pay

## 2024-01-10 ENCOUNTER — Encounter: Payer: Self-pay | Admitting: Pharmacist

## 2024-01-10 NOTE — Progress Notes (Deleted)
 01/10/2024 Name: Pam Patel MRN: [id number] DOB: 06-03-1961  Subjective  No chief complaint on file.  Reason for visit: ?  Pam Patel is a 63 y.o. female with a history of diabetes (type 2), who presents today for an initial diabetes pharmacotherapy visit.? Pertinent PMH also includes HTN, PAD w claudication, HLD, hypothyroid.  Known DM Complications:  Significant PAD/claudication s/p revascularization RLE    Care Team: Primary Care Provider: Judy Pimple, MD   Date of Last Diabetes Related Visit: with PCP on 2/24  ; 12/5 with PharmD Recent Summary of Change: 2/24: ?? rosuvastatin 20 mg; A1c 10%, unchanged from prev.  12/5: ?? glargine 54u (8% inc)  Medication Access/Adherence: Prescription drug coverage: Payor: / No coverage found. Reports not having insurance since ~2006 when husband's job stopped offering coverage. Pays [facility] (all medication x90 days ~$500 total). Patient income = [place] $900, husband still working income $100k annually.  - Current Patient Assistance: None. Household of 2, income ~$111,000 yearly.  - Patient income = [place] $900, husband still working income $100k annually.   Since Last visit / History of Present Illness: ?  Patient reports implementing plan from last visit. Denies issues with insulin titration. Does feel sugars continue to get a little better with each titration. Denies adverse effects with current medications. Tolerating metformin XR well. Reports severe diarrhea seldomly which she has identified as being related to artificial sweeteners. ~One episode every few weeks only following sugar-free items (Such as Diet Coke).   Reported DM Regimen: ?  glipizide XL 10 mg daily before breakfast (~$49/90 days) metformin ER 500 mg tablet; 1000 mg AM, 500 mg PM (~$49/90 days) Glargine vial 54*** units daily at night (Lantus ~$200/90 days)   DM medications tried in the past:?  N/A  SMBG Per patient memory: ? Checks morning, mid-day,  late afternoon (glucometer) FBG since increasing insulin last: 147 (usally 140s-150s, sometimes lower. Lowest reading 126 mg/dL) After lunch (~1 hour): 193 After dinner (~1-2 hor): 291  Hypo/Hyperglycemia: ?  Symptoms of hypoglycemia since last visit:? no  If yes, it was treated by: n/a  Symptoms of hyperglycemia since last visit:? yes -  Can feel when BG is elevated, dizzy, brain fog, sleepy  Exercise: Limited w severe PAD, recent surgery  Diet: Overall eating much better since previous visit Has stopped eating fast food, sugary drinks, rice, bread. Started drinking tea with sugar-free sweetener *** More fruit instead of sweets (no sweets at all)   DM Prevention:  Statin: Taking; moderate intensity.?  History of chronic kidney disease? no History of albuminuria? yes, last UACR on 10/02/23 = 112 mg/g ACE/ARB - Taking lisinopril 40 mg daily; Urine MA/CR Ratio - elevated urinary albumin excretion.  Last eye exam: Unclear; DUE Last foot exam: 05/08/2023 Tobacco Use: Former smoker  Immunizations:? Flu: Due (Last: 10/23/2019); Pneumococcal: No Record - DUE - <65 with Diabetes; Shingrix: No Record - DUE; Covid (No record)  Cardiovascular Risk Reduction History of clinical ASCVD? yes. Severe PAD bilateral s/p revascularization History of heart failure? no  History of hyperlipidemia? yes Current BMI: 31.6 kg/m2 (Ht 66 in, Wt 89 kg) Taking statin? yes; moderate intensity (rosuvastatin 10) Taking aspirin? Not taking  while on Plavix s/p revascularization (leg) Taking SGLT-2i? no Taking GLP- 1 RA? no   Reported HTN Regimen: ?  Amlodipine 5 mg daily Lisinopril 40 mg daily Metoprolol tartrate 50 mg twice daily  Patient is not checking their blood pressure at home regularly.  ***  Patient denies hypotensive s/sx. No dizziness, lightheadedness.  Patient denies hypertensive symptoms. No headache, chest pain, shortness of breath, visual changes.       _______________________________________________  Objective    Review of Systems:? Limited in the setting of virtual visit  Constitutional:? No fever, chills or unintentional weight loss  Cardiovascular:? No chest pain or pressure, shortness of breath, dyspnea on exertion, orthopnea or LE edema  Pulmonary:? No cough or shortness of breath  GI:? No nausea, vomiting, constipation, diarrhea, abdominal pain, dyspepsia, change in bowel habits  Endocrine:? No polyuria, polyphagia or blurred vision    Physical Examination:  Vitals:  Wt Readings from Last 3 Encounters:  12/31/23 194 lb (88 kg)  12/03/23 198 lb 8 oz (90 kg)  11/29/23 184 lb (83.5 kg)   BP Readings from Last 3 Encounters:  12/31/23 (!) 144/80  12/03/23 (!) 153/72  11/29/23 (!) 188/82   Pulse Readings from Last 3 Encounters:  12/31/23 73  12/03/23 65  11/29/23 76     Labs:?  Lab Results  Component Value Date   HGBA1C 10.0 (A) 12/31/2023   HGBA1C 10.2 (A) 10/02/2023   HGBA1C 11.7 (A) 06/26/2023   GLUCOSE 284 (H) 12/31/2023   MICRALBCREAT 11.2 10/02/2023   CREATININE 0.81 12/31/2023   CREATININE 0.59 12/03/2023   CREATININE 0.60 07/26/2023   GFR 76.92 12/31/2023   GFR 80.85 05/08/2023   GFR 83.48 03/26/2023    Lab Results  Component Value Date   CHOL 136 05/08/2023   LDLCALC 86 12/02/2019   LDLCALC 125 (H) 05/27/2018   LDLCALC 83 02/21/2018   LDLDIRECT 72.0 05/08/2023   LDLDIRECT 165.0 03/26/2023   HDL 32.00 (L) 05/08/2023   TRIG 206.0 (H) 05/08/2023   TRIG 281.0 (H) 03/26/2023   TRIG (H) 12/19/2021    499.0 Triglyceride is over 400; calculations on Lipids are invalid.   ALT 11 12/31/2023   ALT 31 03/26/2023   AST 17 12/31/2023   AST 37 03/26/2023      Chemistry      Component Value Date/Time   NA 134 (L) 12/31/2023 1441   K 4.5 12/31/2023 1441   CL 99 12/31/2023 1441   CO2 25 12/31/2023 1441   BUN 20 12/31/2023 1441   CREATININE 0.81 12/31/2023 1441      Component Value Date/Time    CALCIUM 10.2 12/31/2023 1441   ALKPHOS 76 12/31/2023 1441   AST 17 12/31/2023 1441   ALT 11 12/31/2023 1441   BILITOT 0.5 12/31/2023 1441     The 10-year ASCVD risk score (Arnett DK, et al., 2019) is: 16.2%  Assessment and Plan:   1. Diabetes, type 2: uncontrolled per last A1c of 10% (2/24), relatively unchanged from previous 10.1%. Goal <7% without hypoglycemia. UACR elevated >100 mg/g (09/2023) despite adherence to RAASi. SGLT2i has been cost-prohibitive d/t no insurance.   Glucometer data shows fasting sugars typically around upper 140s, lowest reading 126 mg/dL. Progressively climb through day with after-dinner sugars >200 mg/dL.  Currently paying almost $200/90 days of insulin (not including needles). Discussed Lilly insulin value program. Agreeable to transition from vial to pens (see below). Will continue vial until gone (2-3 weeks). Prefers in-person injection training.  Current Regimen: Metformin XR 1000/500 mg am/pm, glargine (vial/syringe) 50 units daily (increased >1 week ago), glipizide XL 10 mg AM. Increase glargine 54?*** units daily (***% increase) Increase Metformin XR 1000 mg twice daily Injection training 2 weeks (vials?pens) Reviewed s/sx/tx hypoglycemia Future Consideration: GLP1-RA: Ideal agent though cost-prohibitive given no insurance and  high income.  SGLT2i: Ideal agent in the setting of albuminuria though cost-prohibitive Metformin:  Tolerating XR formulation well. Has identified source of GI upset. Increase to 2000 mg BID at follow up if amenable   TZD: Avoiding due to possible weight gain/increase in fracture risk with only moderate A1c-reduction.    2. Medication Cost: Household of 2, income ~$111,000 yearly. Does not qualify for PAP. [facility] (note, patient reports prices are reasonable for her) though these medications should be cheaper elsewhere.   Of [employer], patient prefers [facility]. Declines mail order for  now. Would like all medications at [facility] including those that are cheaper elsewhere (as below).  Rx refills sent to [facility] for all PCP-prescribed meds (except insulin and rosuvastatin). New eRx for [employer] (glargine) + pen needles   [facility]  [facility]  metformin XR  ~$49/90 days $5/month   glipizide XL 10 mg Glipizide XL 5 mg  ~$49/90 days    -- $15/month $5/month fo5mg  $15/90 days  insulin glargine vial  ~$200/90 days  -- --  Insulin syringe/needle  >$100/90 days -- --  Brenzavvy (SGLT2i)   $50/month  Metoprolol tartrate  $10/month $6.20/month  Lisinopril  $5/month   Amlodipine 5  $5/month $6.80 / 90days  Paroxetine  $5/month   Rosuvastatin  $5/month   Levothyroxine 75 mcg  $15/month $8.60 / 90 days    LillyInsulin Value Program: [employer] (as low as $35/month for uninsured)    3. ASCVD (secondary prevention, clinical symptomatic PAD s/p revascularization procedure): Uncontrolled though improved on last lipid panel with LDL 165?72 mg/dL, TG 161?096 mg/dL (May?Jul 0454). LDL goal  <55 mg/dL per symptomatic ASCVD and DM with several risk factors . Statin increased at last PCP visit, 2/24.  Current Regimen: rosuvastatin 20 mg daily Repeat lipid panel >4-12 weeks s/p statin increase (2/24)   4. HTN: Uncontrolled based on last clinic BP of 153/72 mmHg, goal <130/80 mmHg. Does not monitor BP at home. Denies lightheadedness, dizziness, SOB, CP, vision changes.  Current Regimen: amlodipine 5 mg qd, lisinopril 40 mg qd, metoprolol tartrate 50 mg BID Continue medications without changes.   Follow Up Follow up with clinical pharmacist via office visit for injection training in 2 weeks Patient given direct line for questions regarding medication therapy and cost  Future Appointments  Date Time Provider Department Center  01/15/2024  2:30 PM Tower, Audrie Gallus, MD [facility]    Loree Fee, PharmD Clinical Pharmacist [facility] [phone]

## 2024-01-10 NOTE — Progress Notes (Signed)
 Attempted to contact patient for scheduled appointment for medication management. Left HIPAA compliant message for patient to return my call at their convenience.   On second attempt, did reach patient successfully though reports she is not available to speak this week.   States she will call back next week if able. Notably, PCP visit scheduled for next week.  Future Appointments  Date Time Provider Department Center  01/15/2024  2:30 PM Tower, Audrie Gallus, MD [facility]

## 2024-01-13 ENCOUNTER — Other Ambulatory Visit: Payer: Self-pay | Admitting: Family Medicine

## 2024-01-13 DIAGNOSIS — E1142 Type 2 diabetes mellitus with diabetic polyneuropathy: Secondary | ICD-10-CM

## 2024-01-15 ENCOUNTER — Ambulatory Visit: Payer: Self-pay | Admitting: Family Medicine

## 2024-01-15 ENCOUNTER — Other Ambulatory Visit: Payer: Self-pay

## 2024-02-01 ENCOUNTER — Ambulatory Visit: Payer: Self-pay | Admitting: Family Medicine

## 2024-02-05 NOTE — Telephone Encounter (Signed)
 Her microalbumin test from November was higher than anticipated after re calculation  This is protein in urine from diabetes  I want to re check it next time she is here  She was due for visit with me and no showed (may have also no showed with pharmacist?)  Need to re schedule appointment with me if she wants to come back   Thanks

## 2024-02-05 NOTE — Telephone Encounter (Signed)
 Called pt and no answer and no VM box set up unable to reach pt so letter mailed

## 2024-02-18 ENCOUNTER — Ambulatory Visit (INDEPENDENT_AMBULATORY_CARE_PROVIDER_SITE_OTHER): Payer: Self-pay | Admitting: Family Medicine

## 2024-02-18 ENCOUNTER — Encounter: Payer: Self-pay | Admitting: Family Medicine

## 2024-02-18 VITALS — BP 150/86 | HR 83 | Temp 97.5°F | Ht 65.0 in | Wt 198.5 lb

## 2024-02-18 DIAGNOSIS — Z7985 Long-term (current) use of injectable non-insulin antidiabetic drugs: Secondary | ICD-10-CM

## 2024-02-18 DIAGNOSIS — I10 Essential (primary) hypertension: Secondary | ICD-10-CM

## 2024-02-18 DIAGNOSIS — E1169 Type 2 diabetes mellitus with other specified complication: Secondary | ICD-10-CM

## 2024-02-18 DIAGNOSIS — E66811 Obesity, class 1: Secondary | ICD-10-CM

## 2024-02-18 DIAGNOSIS — E785 Hyperlipidemia, unspecified: Secondary | ICD-10-CM

## 2024-02-18 DIAGNOSIS — Z7984 Long term (current) use of oral hypoglycemic drugs: Secondary | ICD-10-CM

## 2024-02-18 DIAGNOSIS — E1142 Type 2 diabetes mellitus with diabetic polyneuropathy: Secondary | ICD-10-CM

## 2024-02-18 MED ORDER — SEMAGLUTIDE(0.25 OR 0.5MG/DOS) 2 MG/3ML ~~LOC~~ SOPN: 0.2500 mg | PEN_INJECTOR | SUBCUTANEOUS | 0 refills | Status: DC

## 2024-02-18 MED ORDER — LOSARTAN POTASSIUM 100 MG PO TABS: 100.0000 mg | ORAL_TABLET | Freq: Every day | ORAL | 1 refills | Status: DC

## 2024-02-18 NOTE — Assessment & Plan Note (Signed)
 Disc goals for lipids and reasons to control them Rev last labs with pt Rev low sat fat diet in detail  Lab today  Crestor increased to 20 mg at last visit

## 2024-02-18 NOTE — Assessment & Plan Note (Addendum)
 Discussed how this problem influences overall health and the risks it imposes  Reviewed plan for weight loss with lower calorie diet (via better food choices (lower glycemic and portion control) along with exercise building up to or more than 30 minutes 5 days per week including some aerobic activity and strength training   If affordable/discussed GLP-1 med  Ozempic sent to pharmacy for 0.25 mg dose

## 2024-02-18 NOTE — Assessment & Plan Note (Signed)
 Lab Results  Component Value Date   HGBA1C 10.0 (A) 12/31/2023   HGBA1C 10.2 (A) 10/02/2023   HGBA1C 11.7 (A) 06/26/2023   Not well controlled  Medicines limited by lack of insurance  Taking basal insulin at 54 u daily  Glipizide xl 10 mg daily  Metformin xr 1000 mg bid   Interested in semaglutide and wants to see if affordable out of pocket Prescription for 0.25 mg weekly to start  Disc option of GLP medication including possible side effects like GI intolerance and risk of thyroid and endocrine cancer, pancreatitis and gallstones, kidney problems and diabetic retinopathy  Will call if/when she gets this before starting / will monitor closely  Also plan appointment with pharmacist for help with management /cost of medications  Microalbuminurina noted as well  Will change ace to losartan 100 mg daily for this and blood pressure   Plan follow up after pharm appointment

## 2024-02-18 NOTE — Progress Notes (Signed)
 Subjective:    Patient ID: Pam Patel, female    DOB: 10/26/1961, 62 y.o.   MRN: [id number]  HPI  Wt Readings from Last 3 Encounters:  02/18/24 198 lb 8 oz (90 kg)  12/31/23 194 lb (88 kg)  12/03/23 198 lb 8 oz (90 kg)   33.03 kg/m  Vitals:   02/18/24 1404  BP: (!) 150/86  Pulse: 83  Temp: (!) 97.5 F (36.4 C)  SpO2: 98%    Pt presents for follow up of chronic medical problems including DM2 and HTN and hyperlipidemia     HTN bp is stable today  No cp or palpitations or headaches or edema  No side effects to medicines  BP Readings from Last 3 Encounters:  02/18/24 (!) 150/86  12/31/23 (!) 144/80  12/03/23 (!) 153/72    Amlodipine 5 mg daily  Lisinopril 40 mg daily  Metoprolol 50 mg bid   Pulse Readings from Last 3 Encounters:  02/18/24 83  12/31/23 73  12/03/23 65     Last visit discussed trying to eat lower sodium/ processed foods   Lab Results  Component Value Date   NA 134 (L) 12/31/2023   K 4.5 12/31/2023   CO2 25 12/31/2023   GLUCOSE 284 (H) 12/31/2023   BUN 20 12/31/2023   CREATININE 0.81 12/31/2023   CALCIUM 10.2 12/31/2023   GFR 76.92 12/31/2023   GFRNONAA >60 12/03/2023   Lab Results  Component Value Date   ALT 11 12/31/2023   AST 17 12/31/2023   ALKPHOS 76 12/31/2023   BILITOT 0.5 12/31/2023      DM2 Lab Results  Component Value Date   HGBA1C 10.0 (A) 12/31/2023   HGBA1C 10.2 (A) 10/02/2023   HGBA1C 11.7 (A) 06/26/2023   Glipizide xl 10 mg daily  Lantus 54 u daily  Metformin xr 1000 am and 500 pm -this was increase to 1000 mg bid   Glucose readings are a little lower but not a big difference  She stopped eating pre prepared meals   Eating more fish /baked No beverages with calories (this is her norm)  Makes her tea with splenda   Eats fruit cups (no sugar added) -when craving sweets  Some apples  Lots of veggies   Harder to get enough protein  Egg in am  Protein shake 30 g of protein /low sugar    Exercise Working out in the garden  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   Is interested in ozempic if affordable   Lab Results  Component Value Date   MICROALBUR 14.4 (H) 10/02/2023   Ratio of 112 per labs in November with correction  On ace    Last visit neuropathy was worse  Unable to afford higher cost meds    Hyperlipidemia Lab Results  Component Value Date   CHOL 136 05/08/2023   HDL 32.00 (L) 05/08/2023   LDLCALC 86 12/02/2019   LDLDIRECT 72.0 05/08/2023   TRIG 206.0 (H) 05/08/2023   CHOLHDL 4 05/08/2023   Crestor was increased to 20 mg last visit   More hand pain recently  Right 5th finger   Varicose vein nest left leg     Patient Active Problem List   Diagnosis Date Noted   Peripheral neuropathy 12/31/2023   PAD (peripheral artery disease) (HCC) 10/02/2023   Hyponatremia 06/26/2023   Does not have health insurance 05/08/2023   Atherosclerosis of native arteries of extremity with intermittent  claudication (HCC) 03/26/2023   Poor compliance 03/26/2023   Obesity (BMI 30.0-34.9) 08/08/2020   Hot flashes 08/28/2018   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   Dental disease 03/07/2018   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   Lung nodule 03/07/2018   Hypertension 03/06/2018   Panic disorder 03/06/2018   Thyromegaly 03/06/2018   DM type 2 with diabetic peripheral neuropathy (HCC) 03/06/2018   Hypothyroidism 10/05/2008   STRESS REACTION, ACUTE, WITH EMOTIONAL DISTURBANCE 10/05/2008   Hyperlipidemia associated with type 2 diabetes mellitus (HCC) 01/15/2008   Former smoker 01/15/2008   ALLERGIC RHINITIS, SEASONAL 01/15/2008   DEGENERATIVE DISC DISEASE, 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   Past Medical History:  Diagnosis Date   History of chickenpox    History of diabetes mellitus, type II    History of hypertension    Hypertension    Past Surgical History:  Procedure Laterality Date   APPENDECTOMY  1990   BACK SURGERY      LOWER EXTREMITY ANGIOGRAPHY Right 07/26/2023   Procedure: Lower Extremity Angiography;  Surgeon: Annice Needy, MD;  Location: [facility] INVASIVE [facility];  Service: Cardiovascular;  Laterality: Right;   LOWER EXTREMITY ANGIOGRAPHY Left 12/03/2023   Procedure: Lower Extremity Angiography;  Surgeon: Annice Needy, MD;  Location: [facility] INVAS[facility];  Service: Cardiovascular;  Laterality: Left;   Social History   Tobacco Use   Smoking status: Former   Smokeless tobacco: Never  Vaping Use   Vaping status: Every Day  Substance Use Topics   Alcohol use: Not Currently   Drug use: Never   Family History  Problem Relation Age of Onset   COPD Mother    Diabetes Mother    Heart attack Mother    Heart disease Mother    Hypertension Mother    Stroke Mother    Heart attack Father    Allergies  Allergen Reactions   Penicillins Hives    Has patient had a PCN reaction causing immediate rash, facial/tongue/throat swelling, SOB or lightheadedness with hypotension: Unknown Has patient had a PCN reaction causing severe rash involving mucus membranes or skin necrosis: No Has patient had a PCN reaction that required hospitalization: No Has patient had a PCN reaction occurring within the last 10 years: No If all of the above answers are "NO", then may proceed with Cephalosporin use.    Naproxen     REACTION: hives   Current Outpatient Medications on File Prior to Visit  Medication Sig Dispense Refill   amLODipine (NORVASC) 5 MG tablet Take 1 tablet (5 mg total) by mouth daily. 90 tablet 2   aspirin EC 81 MG tablet Take 1 tablet (81 mg total) by mouth daily. 30 tablet 11   BD INSULIN SYRINGE U/F 30G X 1/2" 0.5 ML MISC USE WITH INSULIN ONCE DAILY 100 each 1   blood glucose meter kit and supplies KIT Dispense based on patient and insurance preference. Use up to four times daily as directed. (FOR ICD-9 250.00, 250.01). 1 each 0   clopidogrel (PLAVIX) 75 MG tablet Take 1 tablet (75 mg total) by mouth daily.  30 tablet 11   gabapentin (NEURONTIN) 100 MG capsule Take 1 capsule (100 mg total) by mouth 2 (two) times daily. 60 capsule 1   glipiZIDE (GLUCOTROL XL) 10 MG 24 hr tablet Take 1 tablet (10 mg total) by mouth daily with breakfast. 30 tablet 6   Insulin Glargine (BASAGLAR KWIKPEN) 100 UNIT/ML Inject 54 Units into the skin daily. 15 mL 2   insulin glargine-yfgn (SEMGLEE) 100  UNIT/ML injection Inject into the skin daily.     Insulin Pen Needle (PEN NEEDLES) 31G X 5 MM MISC Use to inject insulin once daily 100 each 4   LANTUS 100 UNIT/ML injection INJECT 50 UNITS INTO THE SKIN DAILY 10 mL 2   levothyroxine (SYNTHROID) 75 MCG tablet Take 1 tablet (75 mcg total) by mouth daily before breakfast. 90 tablet 1   metFORMIN (GLUCOPHAGE-XR) 500 MG 24 hr tablet Take 2 tablets (1,000 mg total) by mouth every morning AND 2 tablets (1,000 mg total) every evening. 360 tablet 0   metoprolol tartrate (LOPRESSOR) 50 MG tablet TAKE 1 TABLET BY MOUTH TWICE A DAY 180 tablet 0   ondansetron (ZOFRAN-ODT) 4 MG disintegrating tablet Take 1 tablet (4 mg total) by mouth every 8 (eight) hours as needed for nausea or vomiting. 15 tablet 0   PARoxetine (PAXIL) 20 MG tablet TAKE 1 TABLET BY MOUTH EVERY DAY 90 tablet 0   rosuvastatin (CRESTOR) 20 MG tablet Take 1 tablet (20 mg total) by mouth daily. 90 tablet 3   traMADol (ULTRAM) 50 MG tablet Take 1 tablet (50 mg total) by mouth every 6 (six) hours as needed. 20 tablet 0   No current facility-administered medications on file prior to visit.    Review of Systems  Constitutional:  Positive for fatigue. Negative for activity change, appetite change, fever and unexpected weight change.  HENT:  Negative for congestion, ear pain, rhinorrhea, sinus pressure and sore throat.   Eyes:  Negative for pain, redness and visual disturbance.  Respiratory:  Negative for cough, shortness of breath and wheezing.   Cardiovascular:  Negative for chest pain and palpitations.  Gastrointestinal:   Negative for abdominal pain, blood in stool, constipation and diarrhea.  Endocrine: Negative for polydipsia and polyuria.  Genitourinary:  Negative for dysuria, frequency and urgency.  Musculoskeletal:  Negative for arthralgias, back pain and myalgias.  Skin:  Negative for pallor and rash.  Allergic/Immunologic: Negative for environmental allergies.  Neurological:  Negative for dizziness, syncope and headaches.  Hematological:  Negative for adenopathy. Does not bruise/bleed easily.  Psychiatric/Behavioral:  Negative for decreased concentration and dysphoric mood. The patient is not nervous/anxious.        Objective:   Physical Exam Constitutional:      General: She is not in acute distress.    Appearance: Normal appearance. She is well-developed. She is obese. She is not ill-appearing or diaphoretic.  HENT:     Head: Normocephalic and atraumatic.  Eyes:     Conjunctiva/sclera: Conjunctivae normal.     Pupils: Pupils are equal, round, and reactive to light.  Neck:     Thyroid: No thyromegaly.     Vascular: No carotid bruit or JVD.  Cardiovascular:     Rate and Rhythm: Normal rate and regular rhythm.     Heart sounds: Normal heart sounds.     No gallop.  Pulmonary:     Effort: Pulmonary effort is normal. No respiratory distress.     Breath sounds: Normal breath sounds. No wheezing or rales.  Abdominal:     General: There is no distension or abdominal bruit.     Palpations: Abdomen is soft.  Musculoskeletal:     Cervical back: Normal range of motion and neck supple.     Right lower leg: No edema.     Left lower leg: No edema.  Lymphadenopathy:     Cervical: No cervical adenopathy.  Skin:    General: Skin is warm and dry.  Coloration: Skin is not pale.     Findings: No rash.  Neurological:     Mental Status: She is alert.     Coordination: Coordination normal.     Deep Tendon Reflexes: Reflexes are normal and symmetric. Reflexes normal.  Psychiatric:        Mood and  Affect: Mood normal.           Assessment & Plan:   Problem List Items Addressed This Visit       Cardiovascular and Mediastinum   Hypertension   bp is not at goal  BP Readings from Last 1 Encounters:  02/18/24 (!) 150/86   No changes needed Most recent labs reviewed  Disc lifstyle change with low sodium diet and exercise   Currently taking Amlodipine 5 mg daily  Lisinopril 40 mg daily  Metoprolol 50 mg bid   Plan to change lisinopril to losartan 100 mg daily for the and microalbuminuria   Encouraged to call if any side effects   Plan appointment with pharmacist and then follow up with me   Stopped eating frozen pre prepared meals due to sodium content , commended        Relevant Medications   losartan (COZAAR) 100 MG tablet     Endocrine   Hyperlipidemia associated with type 2 diabetes mellitus (HCC)   Disc goals for lipids and reasons to control them Rev last labs with pt Rev low sat fat diet in detail  Lab today  Crestor increased to 20 mg at last visit         Relevant Medications   Semaglutide,0.25 or 0.5MG /DOS, 2 MG/3ML SOPN   losartan (COZAAR) 100 MG tablet   Other Relevant Orders   Lipid panel   Comprehensive metabolic panel with GFR   DM type 2 with diabetic peripheral neuropathy (HCC) - Primary   Lab Results  Component Value Date   HGBA1C 10.0 (A) 12/31/2023   HGBA1C 10.2 (A) 10/02/2023   HGBA1C 11.7 (A) 06/26/2023   Not well controlled  Medicines limited by lack of insurance  Taking basal insulin at 54 u daily  Glipizide xl 10 mg daily  Metformin xr 1000 mg bid   Interested in semaglutide and wants to see if affordable out of pocket Prescription for 0.25 mg weekly to start  Disc option of GLP medication including possible side effects like GI intolerance and risk of thyroid and endocrine cancer, pancreatitis and gallstones, kidney problems and diabetic retinopathy  Will call if/when she gets this before starting / will monitor  closely  Also plan appointment with pharmacist for help with management /cost of medications  Microalbuminurina noted as well  Will change ace to losartan 100 mg daily for this and blood pressure   Plan follow up after pharm appointment          Relevant Medications   Semaglutide,0.25 or 0.5MG /DOS, 2 MG/3ML SOPN   losartan (COZAAR) 100 MG tablet     Other   Obesity (BMI 30.0-34.9)   Discussed how this problem influences overall health and the risks it imposes  Reviewed plan for weight loss with lower calorie diet (via better food choices (lower glycemic and portion control) along with exercise building up to or more than 30 minutes 5 days per week including some aerobic activity and strength training   If affordable/discussed GLP-1 med  Ozempic sent to pharmacy for 0.25 mg dose

## 2024-02-18 NOTE — Patient Instructions (Addendum)
 Stop lisinopril and start losartan 100 mg daily (if you can afford it)   If you cannot afford it let us  know    Let me know if you can get generic ozempic (semaglutide)  Don't start it until you let me know   Stop up front  Make an appointment with Heidi Llamas -our pharmacist    Follow up with me about a month after seeing her    Try to get most of your carbohydrates from produce (with the exception of white potatoes) and whole grains Eat less bread/pasta/rice/snack foods/cereals/sweets and other items from the middle of the grocery store (processed carbs)   The following are examples of protein in diet  Meat  Fish  Eggs  Dairy products  Soy products  Oat milk  Almond milk Nuts and nut butters  Dried beans   Labs today for cholesterol

## 2024-02-18 NOTE — Assessment & Plan Note (Addendum)
 bp is not at goal  BP Readings from Last 1 Encounters:  02/18/24 (!) 150/86   No changes needed Most recent labs reviewed  Disc lifstyle change with low sodium diet and exercise   Currently taking Amlodipine 5 mg daily  Lisinopril 40 mg daily  Metoprolol 50 mg bid   Plan to change lisinopril to losartan 100 mg daily for the and microalbuminuria   Encouraged to call if any side effects   Plan appointment with pharmacist and then follow up with me   Stopped eating frozen pre prepared meals due to sodium content , commended

## 2024-02-19 ENCOUNTER — Telehealth: Payer: Self-pay | Admitting: *Deleted

## 2024-02-19 ENCOUNTER — Encounter: Payer: Self-pay | Admitting: Family Medicine

## 2024-02-19 LAB — COMPREHENSIVE METABOLIC PANEL WITH GFR
ALT: 24 U/L (ref 0–35)
AST: 39 U/L — ABNORMAL HIGH (ref 0–37)
Albumin: 4.2 g/dL (ref 3.5–5.2)
Alkaline Phosphatase: 89 U/L (ref 39–117)
BUN: 14 mg/dL (ref 6–23)
CO2: 26 meq/L (ref 19–32)
Calcium: 10.2 mg/dL (ref 8.4–10.5)
Chloride: 99 meq/L (ref 96–112)
Creatinine, Ser: 0.84 mg/dL (ref 0.40–1.20)
GFR: 73.56 mL/min (ref >60.00)
Glucose, Bld: 372 mg/dL — ABNORMAL HIGH (ref 70–99)
Potassium: 4.3 meq/L (ref 3.5–5.1)
Sodium: 132 meq/L — ABNORMAL LOW (ref 135–145)
Total Bilirubin: 0.5 mg/dL (ref 0.2–1.2)
Total Protein: 7.4 g/dL (ref 6.0–8.3)

## 2024-02-19 LAB — LIPID PANEL
Cholesterol: 157 mg/dL (ref 0–200)
HDL: 27.8 mg/dL — ABNORMAL LOW (ref >39.00)
LDL Cholesterol: 69 mg/dL (ref 0–99)
NonHDL: 128.91
Total CHOL/HDL Ratio: 6
Triglycerides: 301 mg/dL — ABNORMAL HIGH (ref 0.0–149.0)
VLDL: 60.2 mg/dL — ABNORMAL HIGH (ref 0.0–40.0)

## 2024-02-19 NOTE — Telephone Encounter (Signed)
 Dr. Malissa Se sent a message saying:  I wanted to make sure this pt gets a pharmacy visit scheduled  Then follow up with me in late may (or earlier if needed)- A1c will be due after 5/24  Thanks   Will route to the front office to schedule f/u with PCP and also Heidi Llamas to f/u on the pharmacy visit

## 2024-02-25 NOTE — Telephone Encounter (Signed)
 We prescribe it as semaglutide -that is the generic name  (it pops up as generic in epic so I guess that is wrong) Thanks for letting me know  Glad she was able to get it  Let me know if any intolerable side effects like nausea/ vomiting or abdominal pain , or anything else

## 2024-02-25 NOTE — Telephone Encounter (Signed)
 Pharmacy has schedule appt, please see Dr. Belva Boyden comments regarding pt's f/u with her on or after 03/29/24

## 2024-02-25 NOTE — Telephone Encounter (Signed)
 Spoke to pt, scheduled f/u for 04/07/24. Pt also wanted to let Dr. Malissa Se know she is able to start Ozempic , whenever Dr. Malissa Se wants her to start the meds. Pt states Dr. Malissa Se sent in a rx for a generic brand for Ozempic . Pt states she was told by the Pharmacist, that there wasn't a generic brand for ozempic . Please advise.

## 2024-02-27 ENCOUNTER — Other Ambulatory Visit: Payer: Self-pay

## 2024-02-27 NOTE — Progress Notes (Deleted)
 02/27/2024 Name: Pam Patel MRN: [id number] DOB: [birth year]  Subjective  No chief complaint on file.  Reason for visit: ?  Pam Patel is a 63 y.o. female with a history of diabetes (type 2), who presents today for an initial diabetes pharmacotherapy visit.? Pertinent PMH also includes HTN, PAD w claudication, HLD, hypothyroid.  Known DM Complications:  Significant PAD/claudication s/p revascularization RLE    Care Team: Primary Care Provider: Clemens Curt, MD   Date of Last Diabetes Related Visit: with PCP on 02/18/24   Recent Summary of Change: 4/14: Start Ozempic   Medication Access/Adherence: Prescription drug coverage:  Reports not having insurance since ~2006 when husband's job stopped offering coverage. Pays [facility] (all medication x90 days ~$500 total). Patient income = SS $900, husband still working income $100k annually.  - Current Patient Assistance: None. Household of 2, income ~$111,000 yearly.   Since Last visit / History of Present Illness: ?  Patient reports implementing plan from last visit. Denies issues with insulin  titration. Does feel sugars continue to get a little better with each titration. Denies adverse effects with current medications. Tolerating metformin  XR well. Reports severe diarrhea seldomly which she has identified as being related to artificial sweeteners. ~One episode every few weeks only following sugar-free items (Such as Diet Coke).   Reported DM Regimen: ?  glipizide  XL 10 mg daily before breakfast (~$49/90 days) metformin  ER 1000 mg twice daily (~$49/90 days) Basaglar  *** 54 units daily    DM medications tried in the past:?  N/A  SMBG Per patient memory: ? Checks morning, mid-day, late afternoon (glucometer) FBG since increasing insulin  last: 147 (usally 140s-150s, sometimes lower. Lowest reading 126 mg/dL) After lunch (~1 hour): 193 After dinner (~1-2 hor): 291  Hypo/Hyperglycemia: ?  Symptoms of hypoglycemia  since last visit:? no  If yes, it was treated by: n/a  Symptoms of hyperglycemia since last visit:? yes -  Can feel when BG is elevated, dizzy, brain fog, sleepy  Exercise: Limited w severe PAD, recent surgery  DM Prevention:  Statin: Taking; high intensity.?  History of chronic kidney disease? no History of albuminuria? yes, last UACR on 10/02/23 = 112 mg/g ACE/ARB - Taking losartan  100*** mg daily; Urine MA/CR Ratio - elevated urinary albumin excretion >30.  Last eye exam: Unclear; DUE Last foot exam: 05/08/2023 Tobacco Use: Former smoker  Immunizations:? Flu: Due (Last: 10/23/2019); Pneumococcal: No Record - DUE - <65 with Diabetes; Shingrix: No Record - DUE; Covid (No record)  Cardiovascular Risk Reduction History of clinical ASCVD? yes. Severe PAD bilateral s/p revascularization History of heart failure? no  History of hyperlipidemia? yes Current BMI: 31.6 kg/m2 (Ht 66 in, Wt 89 kg) Taking statin? yes; high intensity (rosuvastatin  20) Taking aspirin ? Not taking  while on Plavix  s/p revascularization (leg) Taking SGLT-2i? no Taking GLP- 1 RA? no, recently prescribed ozempic    Reported HTN Regimen: ?  Amlodipine  5 mg daily Losartan  100 mg daily *** Metoprolol  tartrate 50 mg mg twice daily  Patient is not checking their blood pressure at home regularly.   Patient denies hypotensive s/sx. No dizziness, lightheadedness.  Patient denies hypertensive symptoms. No headache, chest pain, shortness of breath, visual changes.      _______________________________________________  Objective    Review of Systems:? Limited in the setting of virtual visit  Constitutional:? No fever, chills or unintentional weight loss  Cardiovascular:? No chest pain or pressure, shortness of breath, dyspnea on exertion, orthopnea or LE edema  Pulmonary:?  No cough or shortness of breath  GI:? No nausea, vomiting, constipation, diarrhea, abdominal pain, dyspepsia, change in bowel habits  Endocrine:? No  polyuria, polyphagia or blurred vision    Physical Examination:  Vitals:  Wt Readings from Last 3 Encounters:  02/18/24 198 lb 8 oz (90 kg)  12/31/23 194 lb (88 kg)  12/03/23 198 lb 8 oz (90 kg)   BP Readings from Last 3 Encounters:  02/18/24 (!) 150/86  12/31/23 (!) 144/80  12/03/23 (!) 153/72   Pulse Readings from Last 3 Encounters:  02/18/24 83  12/31/23 73  12/03/23 65     Labs:?  Lab Results  Component Value Date   HGBA1C 10.0 (A) 12/31/2023   HGBA1C 10.2 (A) 10/02/2023   HGBA1C 11.7 (A) 06/26/2023   GLUCOSE 372 (H) 02/18/2024   MICRALBCREAT 11.2 10/02/2023   CREATININE 0.84 02/18/2024   CREATININE 0.81 12/31/2023   CREATININE 0.59 12/03/2023   GFR 73.56 02/18/2024   GFR 76.92 12/31/2023   GFR 80.85 05/08/2023    Lab Results  Component Value Date   CHOL 157 02/18/2024   LDLCALC 69 02/18/2024   LDLCALC 86 12/02/2019   LDLCALC 125 (H) 05/27/2018   LDLDIRECT 72.0 05/08/2023   LDLDIRECT 165.0 03/26/2023   HDL 27.80 (L) 02/18/2024   TRIG 301.0 (H) 02/18/2024   TRIG 206.0 (H) 05/08/2023   TRIG 281.0 (H) 03/26/2023   ALT 24 02/18/2024   ALT 11 12/31/2023   AST 39 (H) 02/18/2024   AST 17 12/31/2023      Chemistry      Component Value Date/Time   NA 132 (L) 02/18/2024 1443   K 4.3 02/18/2024 1443   CL 99 02/18/2024 1443   CO2 26 02/18/2024 1443   BUN 14 02/18/2024 1443   CREATININE 0.84 02/18/2024 1443      Component Value Date/Time   CALCIUM  10.2 02/18/2024 1443   ALKPHOS 89 02/18/2024 1443   AST 39 (H) 02/18/2024 1443   ALT 24 02/18/2024 1443   BILITOT 0.5 02/18/2024 1443     The ASCVD Risk score (Arnett DK, et al., 2019) failed to calculate for the following reasons:   Risk score cannot be calculated because patient has a medical history suggesting prior/existing ASCVD  Assessment and Plan:   1. Diabetes, type 2: uncontrolled per last A1c of 10.2% (10/02/23), decreased from previous 11.7% (06/26/23). Goal <7% without hypoglycemia.  Glucometer  data shows fasting sugars typically around upper 140s, lowest reading 126 mg/dL. Progressively climb through day with after-dinner sugars >200 mg/dL.  Currently paying almost $200/90 days of insulin  (not including needles). Discussed Lilly insulin  value program. Agreeable to transition from vial to pens (see below). Will continue vial until gone (2-3 weeks). Prefers in-person injection training.  Current Regimen: Metformin  XR 1000/500 mg am/pm, glargine (vial/syringe) 50 units daily (increased >1 week ago), glipizide  XL 10 mg AM. Increase glargine 50?54 units daily (8% increase) Continue all other medications Injection training 2 weeks (vials?pens) Reviewed s/sx/tx hypoglycemia Future Consideration: GLP1-RA: Ideal agent though cost-prohibitive given no insurance and high income.  SGLT2i: Reasonable agent though cost-prohibitive given no insurance and high income. Metformin :  Tolerating XR formulation well. Has identified source of GI upset   TZD: Avoiding due to possible weight gain/increase in fracture risk with only moderate A1c-reduction.    2. Medication Cost: Household of 2, income ~$111,000 yearly. Does not qualify for PAP. [facility] (note, patient reports prices are reasonable for her) though these medications  should be cheaper elsewhere.   Of [employer], patient prefers [place]. Declines mail order for now. Would like all medications at [facility] including those that are cheaper elsewhere (as below).  Rx refills sent to [facility] for all PCP-prescribed meds (except insulin  and rosuvastatin ). New eRx for Basaglar  Kwikpen (glargine) + pen needles   [facility]  [facility]  metformin  XR  ~$49/90 days $5/month   glipizide  XL 10 mg Glipizide  XL 5 mg  ~$49/90 days    -- $15/month $5/month fo5mg  $15/90 days  insulin  glargine vial  ~$200/90 days  -- --  Insulin  syringe/needle  >$100/90 days -- --  Metoprolol   tartrate  $10/month $6.20/monthLisinopril   $5/month   Amlodipine  5  $5/month $6.80 / 90days  Paroxetine   $5/month   Rosuvastatin   $5/month   Levothyroxine  75 mcg  $15/month $8.60 / 90 days    LillyInsulin Value Program: Basaglar  KwikPen (as low as $35/month for uninsured)    3. ASCVD (secondary prevention, clinical symptomatic PAD s/p revascularization procedure): Uncontrolled though improved on last lipid panel with LDL 165?72 mg/dL, TG 034?742 mg/dL (May?Jul 5956). LDL goal  <55 mg/dL per symptomatic ASCVD and DM with several risk factors .  Current Regimen: rosuvastatin  20 mg daily    4. HTN: controlled based on last clinic BP of 126/64 mmHg (10/02/23), goal <130/80 mmHg. Does not monitor BP at home. Denies lightheadedness, dizziness, SOB, CP, vision changes.  Current Regimen: amlodipine  5 mg qd, lisinopril  40 mg qd, metoprolol  tartrate 50 mg BID Continue medications without changes.   Follow Up Follow up with clinical pharmacist via office visit for injection training in 2 weeks Patient given direct line for questions regarding medication therapy and cost  Future Appointments  Date Time Provider Department Center  02/27/2024  1:00 PM [facility] PHARMACIST [facility]  04/07/2024  2:00 PM Tower, Manley Seeds, MD [facility]    Daron Ellen, PharmD Clinical Pharmacist [facility] [phone]

## 2024-02-28 ENCOUNTER — Other Ambulatory Visit: Payer: Self-pay

## 2024-02-28 DIAGNOSIS — E119 Type 2 diabetes mellitus without complications: Secondary | ICD-10-CM

## 2024-02-28 MED ORDER — TIRZEPATIDE-WEIGHT MANAGEMENT 2.5 MG/0.5ML ~~LOC~~ SOLN: 2.5000 mg | SUBCUTANEOUS | 0 refills | Status: DC

## 2024-02-28 NOTE — Patient Instructions (Addendum)
 Pam Patel,   It was a pleasure to speak with you today! As we discussed:?   Continue all medications as you have been taking them  We will get started on your prescription for the tirzepatide (brand name = Mounjaro or Zepbound). This will come through [employer].   [email or web address]    Today we discussed two different brands of the once-weekly injections (Ozempic , Mounjaro) for diabetes  Note* both Ozempic  and Mounjaro are sold for both diabetes and weight loss under different names but the medicine in the pen is exactly the same. This means Ozempic  (semaglutide ) is the same medicine as Wegovy  (semaglutide  for weight loss). And Mounjaro (tirzepatide) is the same medicine as Zepbound (tirzepatide marketed for weight loss).   Ozempic  and Mounjaro are both once-weekly injections to help lower blood sugar in people with diabetes.  We have also identified significant benefit in other illnesses as well such as artery disease, sleep apnea, obesity, fatty liver disease and kidney disease.   Either of these medications would be a strong recommendation for you so it comes down to cost.   Mounjaro is sold by the drug company for a cheaper price (medicine vials similar to an insulin  vial instead of the fancy pen they sell at the pharmacy). Mounjaro's higher doses are also more effective than Ozempic 's highest dose for weight and A1c lowering if needed.  This medicine is started at a small dose as to avoid any side effects. This gives your body plenty of time to adjust before its time for the next dose increase.    Avoiding Side Effects: While on this type of medication: Eating smaller meals, avoiding high-fat and high-sugar foods, and remaining upright after eating may reduce nausea.  Overeating is a major trigger of nausea, as often times patients will start to feel full sooner and may need to decrease portion sizes from what they were previously  accustomed to.  Note, that nausea typically resolves over time and is often a temporary side effect as your body adjusts.   Please reach out prior to your next scheduled appointment should you have any questions or concerns.  Thank you!   Future Appointments  Date Time Provider Department Center  04/07/2024  2:00 PM Tower, Manley Seeds, MD [facility]    Daron Ellen, PharmD Clinical Pharmacist [facility] [phone]

## 2024-02-28 NOTE — Progress Notes (Unsigned)
 02/28/2024 Name: Pam Patel MRN: [id number] DOB: 1961-01-31  Subjective  Chief Complaint  Patient presents with   Diabetes   Weight Management Screening   Reason for visit: ?  Pam Patel is a 63 y.o. female with a history of diabetes (type 2), who presents today for an initial diabetes pharmacotherapy visit.? Pertinent PMH also includes HTN, PAD w claudication, HLD, hypothyroid.  Known DM Complications:  Significant PAD/claudication s/p revascularization RLE    Care Team: Primary Care Provider: Clemens Curt, MD   Date of Last Diabetes Related Visit: with PCP on 02/18/24   Recent Summary of Change: 4/14: Start Ozempic  0.25 mg weekly   Medication Access/Adherence: Prescription drug coverage:  Reports not having insurance since ~2006 when husband's job stopped offering coverage. Pays with Discount card at [facility] (all medication x90 days ~$500 total). Patient income = SS $900, husband still working income $100k annually.  Plans to enroll in Medicare upon turning 64 early next year  Current Patient Assistance: None. Household of 2, income ~$111,000 yearly.   Since Last visit / History of Present Illness: ?  Notes she has not started Ozempic  yet though has decided that she is okay with cash price. Notes pharmacy has told her it will be $1172 for 1 month.  Denies adverse effects with current medications. Tolerating metformin  XR well as goal dose. Reports severe diarrhea seldomly which she has identified as being related to artificial sweeteners. Not as much of an issue as of late. Is more concerned of constipation as a side effect of GLP1RA.    Reported DM Regimen: ?  glipizide  XL 10 mg daily before breakfast (~$49/90 days) metformin  ER 1000 mg twice daily (~$49/90 days) Basaglar  54 units daily (vial - $35/month)   DM medications tried in the past:?  N/A  SMBG Per patient memory: ? Not checking currently   Hypo/Hyperglycemia: ?  Symptoms of hypoglycemia since last visit:? no   If yes, it was treated by: n/a  Symptoms of hyperglycemia since last visit:? yes -  Can feel when BG is elevated, dizzy, brain fog, sleepy  Exercise: Limited w severe PAD, recent surgery/stenting (has now had both legs done)  DM Prevention:  Statin: Taking; high intensity.?  History of chronic kidney disease? no History of albuminuria? yes, last UACR on 10/02/23 = 112 mg/g ACE/ARB - Taking losartan  100 mg daily; Urine MA/CR Ratio - elevated urinary albumin excretion >30.  Last eye exam: Unclear; DUE Last foot exam: 05/08/2023 Tobacco Use: Former smoker  Immunizations:? Flu: Due (Last: 10/23/2019); Pneumococcal: No Record - DUE - <65 with Diabetes; Shingrix: No Record - DUE; Covid (No record)  Cardiovascular Risk Reduction History of clinical ASCVD? yes. Severe PAD bilateral s/p revascularization History of heart failure? no  History of hyperlipidemia? yes Current BMI: 31.6 kg/m2 (Ht 66 in, Wt 89 kg) Taking statin? yes; high intensity (rosuvastatin  20) Taking aspirin ? Not taking  while on Plavix  s/p revascularization (leg) Taking SGLT-2i? no Taking GLP- 1 RA? no, recently prescribed ozempic    Reported HTN Regimen: ?  Amlodipine  5 mg daily Losartan  100 mg daily  Metoprolol  tartrate 50 mg mg twice daily  Patient denies hypotensive s/sx. No dizziness, lightheadedness.  Patient denies hypertensive symptoms. No headache, chest pain, shortness of breath, visual changes.      _______________________________________________  Objective    Review of Systems:? Limited in the setting of virtual visit  Constitutional:? No fever, chills or unintentional weight loss  Cardiovascular:? No chest pain or pressure, shortness  of breath, dyspnea on exertion, orthopnea or LE edema  Pulmonary:? No cough or shortness of breath  GI:? No nausea, vomiting, constipation, diarrhea, abdominal pain, dyspepsia, change in bowel habits  Endocrine:? No polyuria, polyphagia or blurred vision    Physical  Examination:  Vitals:  Wt Readings from Last 3 Encounters:  02/18/24 198 lb 8 oz (90 kg)  12/31/23 194 lb (88 kg)  12/03/23 198 lb 8 oz (90 kg)   BP Readings from Last 3 Encounters:  02/18/24 (!) 150/86  12/31/23 (!) 144/80  12/03/23 (!) 153/72   Pulse Readings from Last 3 Encounters:  02/18/24 83  12/31/23 73  12/03/23 65     Labs:?  Lab Results  Component Value Date   HGBA1C 10.0 (A) 12/31/2023   HGBA1C 10.2 (A) 10/02/2023   HGBA1C 11.7 (A) 06/26/2023   GLUCOSE 372 (H) 02/18/2024   MICRALBCREAT 11.2 10/02/2023   CREATININE 0.84 02/18/2024   CREATININE 0.81 12/31/2023   CREATININE 0.59 12/03/2023   GFR 73.56 02/18/2024   GFR 76.92 12/31/2023   GFR 80.85 05/08/2023    Lab Results  Component Value Date   CHOL 157 02/18/2024   LDLCALC 69 02/18/2024   LDLCALC 86 12/02/2019   LDLCALC 125 (H) 05/27/2018   LDLDIRECT 72.0 05/08/2023   LDLDIRECT 165.0 03/26/2023   HDL 27.80 (L) 02/18/2024   TRIG 301.0 (H) 02/18/2024   TRIG 206.0 (H) 05/08/2023   TRIG 281.0 (H) 03/26/2023   ALT 24 02/18/2024   ALT 11 12/31/2023   AST 39 (H) 02/18/2024   AST 17 12/31/2023      Chemistry      Component Value Date/Time   NA 132 (L) 02/18/2024 1443   K 4.3 02/18/2024 1443   CL 99 02/18/2024 1443   CO2 26 02/18/2024 1443   BUN 14 02/18/2024 1443   CREATININE 0.84 02/18/2024 1443      Component Value Date/Time   CALCIUM  10.2 02/18/2024 1443   ALKPHOS 89 02/18/2024 1443   AST 39 (H) 02/18/2024 1443   ALT 24 02/18/2024 1443   BILITOT 0.5 02/18/2024 1443     The ASCVD Risk score (Arnett DK, et al., 2019) failed to calculate for the following reasons:   Risk score cannot be calculated because patient has a medical history suggesting prior/existing ASCVD  Assessment and Plan:   1. Diabetes, type 2: uncontrolled per last A1c of 10.0% (02/25), relatively unchanged from previous. Goal <7% without hypoglycemia. Tolerating current regimen well w/o concerns. Notes she has not picked up  Ozempic  yet though plans to pay cash price ~$1170/mo. Wediscussed all GLP1 cash-pay options/programs and she is open to Zepbound for half the cost. Will discuss with PCP. No glucometer data today, not checking BG currently.  Current Regimen: Metformin  XR 1000 mg BID, glargine (vial) 54 units daily, glipizide  XL 10 mg AM. Continue all medications Mounjaro 2.5 mg sq once weekly  Dealer) plans to start after upcoming travel ~03/14/24 Reviewed s/sx/tx hypoglycemia Reviewed with the patient that while on a GLP1RA, eating smaller meals, avoiding high-fat foods, and remaining upright after eating may reduce nausea. Also discussed that overeating is a major trigger of nausea with GLP1RAs, as often times patients will start to feel full sooner and may need to decrease portion sizes from what they were previously accustomed to.   Future Consideration: SGLT2i: Reasonable agent though cost-prohibitive given no insurance and high income. Consider once on Medicare, 2026.  TZD: Avoiding due to possible weight gain/increase in fracture risk with  only moderate A1c-reduction.  CGM: Consider once on Medicare, 2026    Follow Up PCP scheduled ~5 weeks Phone checkin with PharmD upon GLP1 start ~2-3 weeks Patient given direct line for questions regarding medication therapy and cost  Future Appointments  Date Time Provider Department Center  04/07/2024  2:00 PM Tower, Manley Seeds, MD [facility]    Daron Ellen, PharmD Clinical Pharmacist [facility] [phone]

## 2024-02-28 NOTE — Telephone Encounter (Signed)
 Discuss with Heidi Llamas today

## 2024-03-07 ENCOUNTER — Encounter: Payer: Self-pay | Admitting: *Deleted

## 2024-03-12 ENCOUNTER — Other Ambulatory Visit: Payer: Self-pay | Admitting: Family Medicine

## 2024-03-12 DIAGNOSIS — F410 Panic disorder [episodic paroxysmal anxiety] without agoraphobia: Secondary | ICD-10-CM

## 2024-03-17 ENCOUNTER — Other Ambulatory Visit: Payer: Self-pay | Admitting: Family Medicine

## 2024-03-17 NOTE — Telephone Encounter (Signed)
 Last filled on 02/28/24 #2 mL/ 0 refills   F/u scheduled on 04/07/24

## 2024-03-17 NOTE — Telephone Encounter (Signed)
 Are you tolerating this well?  If so do you want to go up to the next dose?

## 2024-03-18 NOTE — Telephone Encounter (Signed)
 Pt said she is doing well on her current dose and would like to increase to the next level.   Per refill request it is asking you to send the vials in because pt gets med directly from company ([facility])

## 2024-03-25 ENCOUNTER — Encounter (INDEPENDENT_AMBULATORY_CARE_PROVIDER_SITE_OTHER): Payer: Self-pay

## 2024-04-01 ENCOUNTER — Other Ambulatory Visit: Payer: Self-pay | Admitting: Family Medicine

## 2024-04-01 DIAGNOSIS — I10 Essential (primary) hypertension: Secondary | ICD-10-CM

## 2024-04-01 DIAGNOSIS — E1142 Type 2 diabetes mellitus with diabetic polyneuropathy: Secondary | ICD-10-CM

## 2024-04-07 ENCOUNTER — Encounter: Payer: Self-pay | Admitting: Family Medicine

## 2024-04-07 ENCOUNTER — Ambulatory Visit (INDEPENDENT_AMBULATORY_CARE_PROVIDER_SITE_OTHER): Payer: Self-pay | Admitting: Family Medicine

## 2024-04-07 VITALS — BP 131/55 | HR 91 | Temp 97.7°F | Ht 65.0 in | Wt 190.5 lb

## 2024-04-07 DIAGNOSIS — E785 Hyperlipidemia, unspecified: Secondary | ICD-10-CM

## 2024-04-07 DIAGNOSIS — Z7984 Long term (current) use of oral hypoglycemic drugs: Secondary | ICD-10-CM

## 2024-04-07 DIAGNOSIS — I10 Essential (primary) hypertension: Secondary | ICD-10-CM

## 2024-04-07 DIAGNOSIS — E1142 Type 2 diabetes mellitus with diabetic polyneuropathy: Secondary | ICD-10-CM

## 2024-04-07 DIAGNOSIS — E1169 Type 2 diabetes mellitus with other specified complication: Secondary | ICD-10-CM

## 2024-04-07 DIAGNOSIS — E66811 Obesity, class 1: Secondary | ICD-10-CM

## 2024-04-07 LAB — POCT GLYCOSYLATED HEMOGLOBIN (HGB A1C): Hemoglobin A1C: 9.7 % — AB (ref 4.0–5.6)

## 2024-04-07 MED ORDER — ONDANSETRON 4 MG PO TBDP: 4.0000 mg | ORAL_TABLET | Freq: Three times a day (TID) | ORAL | 1 refills | Status: AC | PRN

## 2024-04-07 NOTE — Progress Notes (Signed)
 Subjective:    Patient ID: Pam Patel, female    DOB: 05/13/1961, 63 y.o.   MRN: [id number]  HPI  Wt Readings from Last 3 Encounters:  04/07/24 190 lb 8 oz (86.4 kg)  02/18/24 198 lb 8 oz (90 kg)  12/31/23 194 lb (88 kg)   31.70 kg/m  Vitals:   04/07/24 1355 04/07/24 1435  BP: (!) 146/92 (!) 131/55  Pulse: 91   Temp: 97.7 F (36.5 C)   SpO2: 100%      Pt presents for follow up of DM2 , HTN , lipids and chronic health problems   HTN bp is stable today  No cp or palpitations or headaches or edema  No side effects to medicines  BP Readings from Last 3 Encounters:  04/07/24 (!) 131/55  02/18/24 (!) 150/86  12/31/23 (!) 144/80     Last visit changed lisinopril  to losartan  100 mg daily for blood pressure and microalbuminuria   Has not checked blood pressure at home (has a cuff)  Also taking Amlodipine  5 mg daily   Metoprolol  50 mg bid     DM2 Lab Results  Component Value Date   HGBA1C 9.7 (A) 04/07/2024   HGBA1C 10.0 (A) 12/31/2023   HGBA1C 10.2 (A) 10/02/2023   Basal insulin  54 u daily Glipizide  xl 10 mg daily  Metformin  xr 1000 mg bid  Discussed option of semaglutide  , pt saw the pharmacist and zepbound was cheaper  Now on 5 mg   It is helping blood sugar    196-227 each check now (what she eats does not make much of a difference)   Is feeling nausea (vomited once weekly) -has used some zofran  and it helped  Some constipation  Occational heartburn relieved by tums  Eating less  This is week 5   Yogurt-high protein  Premier protein shakes  Likes apples    Diet- better  Exercise - is working on garden every day    We tried gabapentin  100 mg bid for neuropathy  Did not help at all    Lab Results  Component Value Date   MICROALBUR 14.4 (H) 10/02/2023   Lab Results  Component Value Date   NA 132 (L) 02/18/2024   K 4.3 02/18/2024   CO2 26 02/18/2024   GLUCOSE 372 (H) 02/18/2024   BUN 14 02/18/2024   CREATININE 0.84 02/18/2024    CALCIUM  10.2 02/18/2024   GFR 73.56 02/18/2024   GFRNONAA >60 12/03/2023      Lab Results  Component Value Date   CHOL 157 02/18/2024   HDL 27.80 (L) 02/18/2024   LDLCALC 69 02/18/2024   LDLDIRECT 72.0 05/08/2023   TRIG 301.0 (H) 02/18/2024   CHOLHDL 6 02/18/2024     Patient Active Problem List   Diagnosis Date Noted   Peripheral neuropathy 12/31/2023   PAD (peripheral artery disease) (HCC) 10/02/2023   Hyponatremia 06/26/2023   Does not have health insurance 05/08/2023   Atherosclerosis of native arteries of extremity with intermittent claudication (HCC) 03/26/2023   Poor compliance 03/26/2023   Obesity (BMI 30.0-34.9) 08/08/2020   Hot flashes 08/28/2018   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   Dental disease 03/07/2018   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   Lung nodule 03/07/2018   Hypertension 03/06/2018   Panic disorder 03/06/2018   Thyromegaly 03/06/2018   DM type 2 with diabetic peripheral neuropathy (HCC) 03/06/2018   Hypothyroidism 10/05/2008   STRESS REACTION, ACUTE, WITH EMOTIONAL DISTURBANCE 10/05/2008   Hyperlipidemia associated with type 2 diabetes  mellitus (HCC) 01/15/2008   Former smoker 01/15/2008   ALLERGIC RHINITIS, SEASONAL 01/15/2008   DEGENERATIVE DISC DISEASE, 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   Past Medical History:  Diagnosis Date   History of chickenpox    History of diabetes mellitus, type II    History of hypertension    Hypertension    Past Surgical History:  Procedure Laterality Date   APPENDECTOMY  1990   BACK SURGERY     LOWER EXTREMITY ANGIOGRAPHY Right 07/26/2023   Procedure: Lower Extremity Angiography;  Surgeon: Celso College, MD;  Location: [facility] IN[facility]ASIVE [facility];  Service: Cardiovascular;  Laterality: Right;   LOWER EXTREMITY ANGIOGRAPHY Left 12/03/2023   Procedure: Lower Extremity Angiography;  Surgeon: Celso College, MD;  Location: [facility]AS[facility];  Service: Cardiovascular;  Laterality: Left;   Social History   Tobacco Use   Smoking  status: Former   Smokeless tobacco: Never  Vaping Use   Vaping status: Every Day  Substance Use Topics   Alcohol use: Not Currently   Drug use: Never   Family History  Problem Relation Age of Onset   COPD Mother    Diabetes Mother    Heart attack Mother    Heart disease Mother    Hypertension Mother    Stroke Mother    Heart attack Father    Allergies  Allergen Reactions   Penicillins Hives    Has patient had a PCN reaction causing immediate rash, facial/tongue/throat swelling, SOB or lightheadedness with hypotension: Unknown Has patient had a PCN reaction causing severe rash involving mucus membranes or skin necrosis: No Has patient had a PCN reaction that required hospitalization: No Has patient had a PCN reaction occurring within the last 10 years: No If all of the above answers are "NO", then may proceed with Cephalosporin use.    Naproxen     REACTION: hives   Current Outpatient Medications on File Prior to Visit  Medication Sig Dispense Refill   amLODipine  (NORVASC ) 5 MG tablet Take 1 tablet (5 mg total) by mouth daily. 90 tablet 2   aspirin  EC 81 MG tablet Take 1 tablet (81 mg total) by mouth daily. 30 tablet 11   blood glucose meter kit and supplies KIT Dispense based on patient and insurance preference. Use up to four times daily as directed. (FOR ICD-9 250.00, 250.01). 1 each 0   clopidogrel  (PLAVIX ) 75 MG tablet Take 1 tablet (75 mg total) by mouth daily. 30 tablet 11   glipiZIDE  (GLUCOTROL  XL) 10 MG 24 hr tablet Take 1 tablet (10 mg total) by mouth daily with breakfast. 30 tablet 6   levothyroxine  (SYNTHROID ) 75 MCG tablet Take 1 tablet (75 mcg total) by mouth daily before breakfast. 90 tablet 1   losartan  (COZAAR ) 100 MG tablet Take 1 tablet (100 mg total) by mouth daily. 90 tablet 1   metFORMIN  (GLUCOPHAGE -XR) 500 MG 24 hr tablet TAKE 2 PILLS BY MOUTH EVERY MORNING AND 1 PILL EVERY EVENING 270 tablet 0   metoprolol  tartrate (LOPRESSOR ) 50 MG tablet TAKE 1 TABLET  BY MOUTH TWICE A DAY 180 tablet 0   PARoxetine  (PAXIL ) 20 MG tablet TAKE 1 TABLET BY MOUTH EVERY DAY 90 tablet 0   rosuvastatin  (CRESTOR ) 20 MG tablet Take 1 tablet (20 mg total) by mouth daily. 90 tablet 3   tirzepatide 5 MG/0.5ML injection vial Inject 5 mg into the skin once a week. 2 mL 0   No current facility-administered medications on file prior to visit.  Review of Systems  Constitutional:  Negative for activity change, appetite change, fatigue, fever and unexpected weight change.  HENT:  Negative for congestion, ear pain, rhinorrhea, sinus pressure and sore throat.   Eyes:  Negative for pain, redness and visual disturbance.  Respiratory:  Negative for cough, shortness of breath and wheezing.   Cardiovascular:  Negative for chest pain and palpitations.  Gastrointestinal:  Positive for constipation and nausea. Negative for abdominal pain, blood in stool and diarrhea.  Endocrine: Negative for polydipsia and polyuria.  Genitourinary:  Negative for dysuria, frequency and urgency.  Musculoskeletal:  Negative for arthralgias, back pain and myalgias.  Skin:  Negative for pallor and rash.  Allergic/Immunologic: Negative for environmental allergies.  Neurological:  Negative for dizziness, syncope and headaches.  Hematological:  Negative for adenopathy. Does not bruise/bleed easily.  Psychiatric/Behavioral:  Negative for decreased concentration and dysphoric mood. The patient is not nervous/anxious.        Objective:   Physical Exam Constitutional:      General: She is not in acute distress.    Appearance: Normal appearance. She is well-developed. She is obese. She is not ill-appearing or diaphoretic.  HENT:     Head: Normocephalic and atraumatic.  Eyes:     Conjunctiva/sclera: Conjunctivae normal.     Pupils: Pupils are equal, round, and reactive to light.  Neck:     Thyroid : No thyromegaly.     Vascular: No carotid bruit or JVD.  Cardiovascular:     Rate and Rhythm: Normal  rate and regular rhythm.     Heart sounds: Normal heart sounds.     No gallop.  Pulmonary:     Effort: Pulmonary effort is normal. No respiratory distress.     Breath sounds: Normal breath sounds. No wheezing or rales.  Abdominal:     General: There is no distension or abdominal bruit.     Palpations: Abdomen is soft.  Musculoskeletal:     Cervical back: Normal range of motion and neck supple.     Right lower leg: No edema.     Left lower leg: No edema.  Lymphadenopathy:     Cervical: No cervical adenopathy.  Skin:    General: Skin is warm and dry.     Coloration: Skin is not pale.     Findings: No rash.  Neurological:     Mental Status: She is alert.     Coordination: Coordination normal.     Deep Tendon Reflexes: Reflexes are normal and symmetric. Reflexes normal.  Psychiatric:        Mood and Affect: Mood normal.           Assessment & Plan:   Problem List Items Addressed This Visit       Cardiovascular and Mediastinum   Hypertension   bp in fair control at this time  BP Readings from Last 1 Encounters:  04/07/24 (!) 131/55   No changes needed Most recent labs reviewed  Disc lifstyle change with low sodium diet and exercise  Improved with addn of losartan  100 mg daily  Continues amlodipine  5 mg daily  Metoprolol  50 mg bid        Relevant Orders   Basic Metabolic Panel     Endocrine   Hyperlipidemia associated with type 2 diabetes mellitus (HCC)   Disc goals for lipids and reasons to control them Rev last labs with pt Rev low sat fat diet in detail  Lab today  Crestor  increased to 20 mg at last visit  Watching liver labs Lab Results  Component Value Date   ALT 24 02/18/2024   AST 39 (H) 02/18/2024   ALKPHOS 89 02/18/2024   BILITOT 0.5 02/18/2024           DM type 2 with diabetic peripheral neuropathy (HCC) - Primary   Lab Results  Component Value Date   HGBA1C 9.7 (A) 04/07/2024   HGBA1C 10.0 (A) 12/31/2023   HGBA1C 10.2 (A)  10/02/2023   Starting to improve with a month of GLP-1 low dose (tirzepatide) - I do not think she would tolerate higher dose currently  Continues Basal insulin  54 u daily Glipizide  xl 10 mg daily  Metformin  xr 1000 mg bid  Eating better and losing weight Gabapentin  did not help neuropathy symptoms Last microalb was high- we added losartan  100 mg   Follow up planned 3 months  Hope will be able to increase tirzepatide at that time        Relevant Orders   POCT HgB A1C (Completed)     Other   Obesity (BMI 30.0-34.9)   Losing weight with tirzepatide so far

## 2024-04-07 NOTE — Assessment & Plan Note (Signed)
 Losing weight with tirzepatide so far

## 2024-04-07 NOTE — Assessment & Plan Note (Signed)
 bp in fair control at this time  BP Readings from Last 1 Encounters:  04/07/24 (!) 131/55   No changes needed Most recent labs reviewed  Disc lifstyle change with low sodium diet and exercise  Improved with addn of losartan  100 mg daily  Continues amlodipine  5 mg daily  Metoprolol  50 mg bid

## 2024-04-07 NOTE — Assessment & Plan Note (Signed)
 Lab Results  Component Value Date   HGBA1C 9.7 (A) 04/07/2024   HGBA1C 10.0 (A) 12/31/2023   HGBA1C 10.2 (A) 10/02/2023   Starting to improve with a month of GLP-1 low dose (tirzepatide) - I do not think she would tolerate higher dose currently  Continues Basal insulin  54 u daily Glipizide  xl 10 mg daily  Metformin  xr 1000 mg bid  Eating better and losing weight Gabapentin  did not help neuropathy symptoms Last microalb was high- we added losartan  100 mg   Follow up planned 3 months  Hope will be able to increase tirzepatide at that time

## 2024-04-07 NOTE — Patient Instructions (Addendum)
 Keep working in garden  On non heavy garden days Add some strength training to your routine, this is important for bone and brain health and can reduce your risk of falls and help your body use insulin  properly and regulate weight  Light weights, exercise bands , and internet videos are a good way to start  Yoga (chair or regular), machines , floor exercises or a gym with machines are also good options   Water exercise is good if you incorporate strength building activity   Continue the generic (zepbound and moujaro -same medicine) at the 5 mg  I am apprehensive to increase it yet  I'm hoping nausea gets better with time   Start checking blood pressure at home when relaxed (if wrist cuff cross wrist over chest at heart level and support with other hand)   Stop the gabapentin  since it is not helping   Continue high protein low sugar diet   Follow up in 3 months

## 2024-04-07 NOTE — Assessment & Plan Note (Addendum)
 Disc goals for lipids and reasons to control them Rev last labs with pt Rev low sat fat diet in detail  Lab today  Crestor  increased to 20 mg at last visit   Watching liver labs Lab Results  Component Value Date   ALT 24 02/18/2024   AST 39 (H) 02/18/2024   ALKPHOS 89 02/18/2024   BILITOT 0.5 02/18/2024

## 2024-04-08 ENCOUNTER — Ambulatory Visit: Payer: Self-pay | Admitting: Family Medicine

## 2024-04-08 LAB — BASIC METABOLIC PANEL WITH GFR
BUN: 22 mg/dL (ref 6–23)
CO2: 24 meq/L (ref 19–32)
Calcium: 10.4 mg/dL (ref 8.4–10.5)
Chloride: 101 meq/L (ref 96–112)
Creatinine, Ser: 0.94 mg/dL (ref 0.40–1.20)
GFR: 64.21 mL/min (ref >60.00)
Glucose, Bld: 203 mg/dL — ABNORMAL HIGH (ref 70–99)
Potassium: 4.5 meq/L (ref 3.5–5.1)
Sodium: 135 meq/L (ref 135–145)

## 2024-04-10 ENCOUNTER — Telehealth: Payer: Self-pay | Admitting: *Deleted

## 2024-04-10 NOTE — Telephone Encounter (Signed)
 Per Dr. Malissa Se:  I want to plan follow up appointment in 3 months please if not already done   *Please schedule 3 month f/u with PCP (labs prior not needed)*

## 2024-04-14 NOTE — Telephone Encounter (Signed)
 Called pt and schedule appt for 3 month f/u

## 2024-04-17 ENCOUNTER — Other Ambulatory Visit: Payer: Self-pay | Admitting: Family Medicine

## 2024-04-18 NOTE — Telephone Encounter (Signed)
 Last filled on 03/18/24, #2 mL/ 0 refill  Next f/u on 07/15/24

## 2024-04-19 ENCOUNTER — Other Ambulatory Visit: Payer: Self-pay | Admitting: Family Medicine

## 2024-04-19 DIAGNOSIS — E039 Hypothyroidism, unspecified: Secondary | ICD-10-CM

## 2024-05-07 ENCOUNTER — Other Ambulatory Visit: Payer: Self-pay | Admitting: Family Medicine

## 2024-05-07 DIAGNOSIS — E1142 Type 2 diabetes mellitus with diabetic polyneuropathy: Secondary | ICD-10-CM

## 2024-05-08 ENCOUNTER — Other Ambulatory Visit: Payer: Self-pay | Admitting: Family Medicine

## 2024-05-15 ENCOUNTER — Telehealth: Payer: Self-pay

## 2024-05-15 NOTE — Telephone Encounter (Signed)
 Copied from CRM [id number]. Topic: Clinical - Medication Question >> May 15, 2024  4:17 PM Chasity T wrote: Reason for CRM: Patient is calling in for medication concerns. She has been trying to get a refill on rosuvastatin  (CRESTOR ) 20 MG tablet and was told that it has been discontinued and wants to know if she needs to still be taking it or if she is completely done with that medication. Her next concern is that she mentioned Dr Randeen has told her to let her know how she is tolerating the medication  tirzepatide  (ZEPBOUND ) 5 MG/0.5ML injection vial before increasing her dosage. She is doing good with it and wants to know if she can go ahead and increase it for her upcoming refill.

## 2024-05-16 MED ORDER — TIRZEPATIDE-WEIGHT MANAGEMENT 7.5 MG/0.5ML ~~LOC~~ SOLN: 7.5000 mg | SUBCUTANEOUS | 0 refills | Status: DC

## 2024-05-16 NOTE — Telephone Encounter (Signed)
 Crestor  was refilled for a year on 12/31/23 will advise pt of that when I call back, will route to PCP 1st so she can advise on the zepbound 

## 2024-05-16 NOTE — Telephone Encounter (Signed)
 Pt notified of Dr. Graham comments regarding Zepbound . I also advised her that Rx for Crestor  was filled on 12/31/23 #90 tab/ 3 refills

## 2024-05-16 NOTE — Addendum Note (Signed)
 Addended by: RAYLEEN GLENDORA HERO on: 05/16/2024 10:52 AM   Modules accepted: Orders

## 2024-05-16 NOTE — Addendum Note (Signed)
 Addended by: RANDEEN HARDY A on: 05/16/2024 10:40 AM   Modules accepted: Orders

## 2024-05-16 NOTE — Telephone Encounter (Signed)
 I sent the zepbound  to [facility] to go up to 7.5 mg dose  Let us  know if any problems  Glad she is tolerating medicine better now  Is there a problem with unavailability of crestor  ? I have not heard   Please check in with pt and phamacy

## 2024-06-11 ENCOUNTER — Other Ambulatory Visit: Payer: Self-pay | Admitting: Family Medicine

## 2024-06-13 NOTE — Telephone Encounter (Signed)
 Last filled on 05/16/24 2 ml/ 0 refills  F/u scheduled 07/15/24

## 2024-06-28 ENCOUNTER — Other Ambulatory Visit: Payer: Self-pay | Admitting: Family Medicine

## 2024-06-28 DIAGNOSIS — I10 Essential (primary) hypertension: Secondary | ICD-10-CM

## 2024-06-29 ENCOUNTER — Other Ambulatory Visit: Payer: Self-pay | Admitting: Family Medicine

## 2024-06-29 DIAGNOSIS — E1142 Type 2 diabetes mellitus with diabetic polyneuropathy: Secondary | ICD-10-CM

## 2024-07-15 ENCOUNTER — Ambulatory Visit: Payer: Self-pay | Admitting: Family Medicine

## 2024-07-22 ENCOUNTER — Other Ambulatory Visit: Payer: Self-pay | Admitting: Family Medicine

## 2024-07-22 DIAGNOSIS — E039 Hypothyroidism, unspecified: Secondary | ICD-10-CM

## 2024-08-01 ENCOUNTER — Encounter: Payer: Self-pay | Admitting: Family Medicine

## 2024-08-01 ENCOUNTER — Ambulatory Visit (INDEPENDENT_AMBULATORY_CARE_PROVIDER_SITE_OTHER): Payer: Self-pay | Admitting: Family Medicine

## 2024-08-01 VITALS — BP 125/78 | HR 83 | Temp 98.2°F | Ht 65.0 in | Wt 183.5 lb

## 2024-08-01 DIAGNOSIS — E010 Iodine-deficiency related diffuse (endemic) goiter: Secondary | ICD-10-CM

## 2024-08-01 DIAGNOSIS — I10 Essential (primary) hypertension: Secondary | ICD-10-CM

## 2024-08-01 DIAGNOSIS — E039 Hypothyroidism, unspecified: Secondary | ICD-10-CM

## 2024-08-01 DIAGNOSIS — E1169 Type 2 diabetes mellitus with other specified complication: Secondary | ICD-10-CM

## 2024-08-01 DIAGNOSIS — Z7985 Long-term (current) use of injectable non-insulin antidiabetic drugs: Secondary | ICD-10-CM

## 2024-08-01 DIAGNOSIS — E785 Hyperlipidemia, unspecified: Secondary | ICD-10-CM

## 2024-08-01 DIAGNOSIS — E1142 Type 2 diabetes mellitus with diabetic polyneuropathy: Secondary | ICD-10-CM

## 2024-08-01 LAB — MICROALBUMIN / CREATININE URINE RATIO
Creatinine,U: 100.7 mg/dL
Microalb Creat Ratio: 83.3 mg/g — ABNORMAL HIGH (ref 0.0–30.0)
Microalb, Ur: 8.4 mg/dL — ABNORMAL HIGH (ref 0.0–1.9)

## 2024-08-01 LAB — POCT GLYCOSYLATED HEMOGLOBIN (HGB A1C): Hemoglobin A1C: 8.4 % — AB (ref 4.0–5.6)

## 2024-08-01 LAB — TSH: TSH: 5.11 u[IU]/mL (ref 0.35–5.50)

## 2024-08-01 MED ORDER — TIRZEPATIDE-WEIGHT MANAGEMENT 5 MG/0.5ML ~~LOC~~ SOLN: 5.0000 mg | SUBCUTANEOUS | 3 refills | Status: AC

## 2024-08-01 NOTE — Assessment & Plan Note (Signed)
 bp in fair control at this time  BP Readings from Last 1 Encounters:  08/01/24 125/78   No changes needed Most recent labs reviewed  Disc lifstyle change with low sodium diet and exercise  Improved with addn of losartan  100 mg daily  Continues amlodipine  5 mg daily  Metoprolol  50 mg bid

## 2024-08-01 NOTE — Assessment & Plan Note (Signed)
 Lab Results  Component Value Date   HGBA1C 8.4 (A) 08/01/2024   HGBA1C 9.7 (A) 04/07/2024   HGBA1C 10.0 (A) 12/31/2023   Gradual improvement Much better diet/exercise-commended Microalb today Basal insulin  54 u daily  Glipizide  xl 10 mg daily  Metformin  xr 1000 mg bid  Intol of tirzepatide  7.5 -will drop back to 5 mg   Also refer to endocrinology for help with management Pt will be getting medicare in February

## 2024-08-01 NOTE — Assessment & Plan Note (Signed)
 Disc goals for lipids and reasons to control them Rev last labs with pt Rev low sat fat diet in detail  Tolerating crestor  20 mg daily   Plan labs after pt has insurance in [place]

## 2024-08-01 NOTE — Progress Notes (Signed)
 Subjective:    Patient ID: Pam Patel, female    DOB: 15-May-1961, 63 y.o.   MRN: [id number]  HPI  Wt Readings from Last 3 Encounters:  08/01/24 183 lb 8 oz (83.2 kg)  04/07/24 190 lb 8 oz (86.4 kg)  02/18/24 198 lb 8 oz (90 kg)   30.54 kg/m  Vitals:   08/01/24 1032 08/01/24 1101  BP: (!) 146/82 125/78  Pulse: 83   Temp: 98.2 F (36.8 C)   SpO2: 99%     Pt presents for follow up of  HTN DM Hypothyroid  Hyperlipidemia   Declines flu shot   Declines eye exam until she gets medicare in February   HTN bp is stable today  No cp or palpitations or headaches or edema  No side effects to medicines  BP Readings from Last 3 Encounters:  08/01/24 125/78  04/07/24 (!) 131/55  02/18/24 (!) 150/86    Amlodipine  5 mg daily  Metoprolol  50 mg bid  Losartan  100 mg daily     Lab Results  Component Value Date   NA 135 04/07/2024   K 4.5 04/07/2024   CO2 24 04/07/2024   GLUCOSE 203 (H) 04/07/2024   BUN 22 04/07/2024   CREATININE 0.94 04/07/2024   CALCIUM  10.4 04/07/2024   GFR 64.21 04/07/2024   GFRNONAA >60 12/03/2023    DM2 With neuropathy symptoms  Diabetes Home sugar results  Highs 170s Lows in 140s Overall much better   DM diet  Really working on it  Totally changed eating  Getting her low sugar protein!  More produce  Much less bread    Exercise  Working out with hand weights   Does have a pinched nerve on her neck -saw chiropractor  Her pool is closed  Founds some videos on line to do   Basal insulin  54 u daily  Glipizide  xl 10 mg daily  Metformin  xr 1000 mg bid  Tirzepatide  7.5 mg weekly- has a lot of side effects   When on the 5 mg had some nausea and bowel movements were better   Today A1c down to 8.4  Lab Results  Component Value Date   HGBA1C 8.4 (A) 08/01/2024   HGBA1C 9.7 (A) 04/07/2024   HGBA1C 10.0 (A) 12/31/2023   No results found for: LABMICR, MICROALBUR  Due for microalb  Renal protection arb Last eye exam  -no insurance/ waiting for medicare   Hyperlipidemia Lab Results  Component Value Date   CHOL 157 02/18/2024   HDL 27.80 (L) 02/18/2024   LDLCALC 69 02/18/2024   LDLDIRECT 72.0 05/08/2023   TRIG 301.0 (H) 02/18/2024   CHOLHDL 6 02/18/2024   Crestor  20 mg daily   Hypothyroidism  Pt has no clinical changes No change in energy level/ hair or skin/ edema and no tremor Lab Results  Component Value Date   TSH 2.86 03/26/2023     Levothyroxine  75 mcg daily     Patient Active Problem List   Diagnosis Date Noted   Peripheral neuropathy 12/31/2023   PAD (peripheral artery disease) 10/02/2023   Hyponatremia 06/26/2023   Does not have health insurance 05/08/2023   Atherosclerosis of native arteries of extremity with intermittent claudication 03/26/2023   Poor compliance 03/26/2023   Obesity (BMI 30.0-34.9) 08/08/2020   Hot flashes 08/28/2018   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   Dental disease 03/07/2018   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   Lung nodule 03/07/2018   Hypertension 03/06/2018   Panic disorder 03/06/2018   Thyromegaly  03/06/2018   DM type 2 with diabetic peripheral neuropathy (HCC) 03/06/2018   Hypothyroidism 10/05/2008   STRESS REACTION, ACUTE, WITH EMOTIONAL DISTURBANCE 10/05/2008   Hyperlipidemia associated with type 2 diabetes mellitus (HCC) 01/15/2008   Former smoker 01/15/2008   ALLERGIC RHINITIS, SEASONAL 01/15/2008   DEGENERATIVE DISC DISEASE, 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   Past Medical History:  Diagnosis Date   History of chickenpox    History of diabetes mellitus, type II    History of hypertension    Hypertension    Past Surgical History:  Procedure Laterality Date   APPENDECTOMY  1990   BACK SURGERY     LOWER EXTREMITY ANGIOGRAPHY Right 07/26/2023   Procedure: Lower Extremity Angiography;  Surgeon: Marea Selinda RAMAN, MD;  Location: [facility];  Service: Cardiovascular;  Laterality: Right;   LOWER EXTREMITY ANGIOGRAPHY Left 12/03/2023    Procedure: Lower Extremity Angiography;  Surgeon: Marea Selinda RAMAN, MD;  Location: [facility];  Service: Cardiovascular;  Laterality: Left;   Social History   Tobacco Use   Smoking status: Former   Smokeless tobacco: Never  Vaping Use   Vaping status: Every Day  Substance Use Topics   Alcohol use: Not Currently   Drug use: Never   Family History  Problem Relation Age of Onset   COPD Mother    Diabetes Mother    Heart attack Mother    Heart disease Mother    Hypertension Mother    Stroke Mother    Heart attack Father    Allergies  Allergen Reactions   Penicillins Hives    Has patient had a PCN reaction causing immediate rash, facial/tongue/throat swelling, SOB or lightheadedness with hypotension: Unknown Has patient had a PCN reaction causing severe rash involving mucus membranes or skin necrosis: No Has patient had a PCN reaction that required hospitalization: No Has patient had a PCN reaction occurring within the last 10 years: No If all of the above answers are NO, then may proceed with Cephalosporin use.    Naproxen     REACTION: hives   Current Outpatient Medications on File Prior to Visit  Medication Sig Dispense Refill   amLODipine  (NORVASC ) 5 MG tablet Take 1 tablet (5 mg total) by mouth daily. 90 tablet 2   aspirin  EC 81 MG tablet Take 1 tablet (81 mg total) by mouth daily. 30 tablet 11   blood glucose meter kit and supplies KIT Dispense based on patient and insurance preference. Use up to four times daily as directed. (FOR ICD-9 250.00, 250.01). 1 each 0   clopidogrel  (PLAVIX ) 75 MG tablet Take 1 tablet (75 mg total) by mouth daily. 30 tablet 11   glipiZIDE  (GLUCOTROL  XL) 10 MG 24 hr tablet TAKE 1 TABLET (10 MG TOTAL) BY MOUTH DAILY WITH BREAKFAST. 90 tablet 1   levothyroxine  (SYNTHROID ) 75 MCG tablet TAKE 1 TABLET BY MOUTH EVERY DAY BEFORE BREAKFAST 90 tablet 0   losartan  (COZAAR ) 100 MG tablet Take 1 tablet (100 mg total) by mouth daily. 90 tablet 1    metFORMIN  (GLUCOPHAGE -XR) 500 MG 24 hr tablet TAKE 2 PILLS BY MOUTH EVERY MORNING AND 1 PILL EVERY EVENING 270 tablet 0   metoprolol  tartrate (LOPRESSOR ) 50 MG tablet TAKE 1 TABLET BY MOUTH TWICE A DAY 180 tablet 0   ondansetron  (ZOFRAN -ODT) 4 MG disintegrating tablet Take 1 tablet (4 mg total) by mouth every 8 (eight) hours as needed for nausea or vomiting. Caution of sedation 20 tablet 1   PARoxetine  (PAXIL ) 20  MG tablet TAKE 1 TABLET BY MOUTH EVERY DAY 90 tablet 0   rosuvastatin  (CRESTOR ) 20 MG tablet Take 1 tablet (20 mg total) by mouth daily. 90 tablet 3   No current facility-administered medications on file prior to visit.    Review of Systems  Constitutional:  Positive for fatigue. Negative for activity change, appetite change, fever and unexpected weight change.  HENT:  Negative for congestion, ear pain, rhinorrhea, sinus pressure and sore throat.   Eyes:  Negative for pain, redness and visual disturbance.  Respiratory:  Negative for cough, shortness of breath and wheezing.   Cardiovascular:  Negative for chest pain and palpitations.  Gastrointestinal:  Negative for abdominal pain, blood in stool, constipation and diarrhea.  Endocrine: Negative for polydipsia and polyuria.  Genitourinary:  Negative for dysuria, frequency and urgency.  Musculoskeletal:  Negative for arthralgias, back pain and myalgias.  Skin:  Negative for pallor and rash.  Allergic/Immunologic: Negative for environmental allergies.  Neurological:  Negative for dizziness, syncope and headaches.  Hematological:  Negative for adenopathy. Does not bruise/bleed easily.  Psychiatric/Behavioral:  Negative for decreased concentration and dysphoric mood. The patient is not nervous/anxious.        Objective:   Physical Exam Constitutional:      General: She is not in acute distress.    Appearance: Normal appearance. She is well-developed. She is obese. She is not ill-appearing or diaphoretic.  HENT:     Head:  Normocephalic and atraumatic.  Eyes:     Conjunctiva/sclera: Conjunctivae normal.     Pupils: Pupils are equal, round, and reactive to light.  Neck:     Thyroid : No thyromegaly.     Vascular: No carotid bruit or JVD.     Comments: Thyroid  appears more prominent on the right  May be due to weight loss in neck  Cardiovascular:     Rate and Rhythm: Normal rate and regular rhythm.     Heart sounds: Normal heart sounds.     No gallop.  Pulmonary:     Effort: Pulmonary effort is normal. No respiratory distress.     Breath sounds: Normal breath sounds. No stridor. No wheezing, rhonchi or rales.  Abdominal:     General: There is no distension or abdominal bruit.     Palpations: Abdomen is soft.  Musculoskeletal:     Cervical back: Normal range of motion and neck supple.     Right lower leg: No edema.     Left lower leg: No edema.  Lymphadenopathy:     Cervical: No cervical adenopathy.  Skin:    General: Skin is warm and dry.     Coloration: Skin is not pale.     Findings: No rash.     Comments: Ruddy complexion   Neurological:     Mental Status: She is alert.     Coordination: Coordination normal.     Deep Tendon Reflexes: Reflexes are normal and symmetric. Reflexes normal.  Psychiatric:        Mood and Affect: Mood normal.           Assessment & Plan:   Problem List Items Addressed This Visit       Cardiovascular and Mediastinum   Hypertension   bp in fair control at this time  BP Readings from Last 1 Encounters:  08/01/24 125/78   No changes needed Most recent labs reviewed  Disc lifstyle change with low sodium diet and exercise  Improved with addn of losartan  100 mg daily  Continues amlodipine  5 mg daily  Metoprolol  50 mg bid          Endocrine   Thyromegaly   Thyroid  appears more prominent on the right side than previous This may be due to weight loss Would like to re image -pt wants to wait until she has ins coverage in the winter No pain or other  symptoms TSH today   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       Hypothyroidism   TSH today  Continues levothyroxine  75 mcg daily      Relevant Orders   TSH   Hyperlipidemia associated with type 2 diabetes mellitus (HCC)   Disc goals for lipids and reasons to control them Rev last labs with pt Rev low sat fat diet in detail  Tolerating crestor  20 mg daily   Plan labs after pt has insurance in [place]       DM type 2 with diabetic peripheral neuropathy (HCC) - Primary   Lab Results  Component Value Date   HGBA1C 8.4 (A) 08/01/2024   HGBA1C 9.7 (A) 04/07/2024   HGBA1C 10.0 (A) 12/31/2023   Gradual improvement Much better diet/exercise-commended Microalb today Basal insulin  54 u daily  Glipizide  xl 10 mg daily  Metformin  xr 1000 mg bid  Intol of tirzepatide  7.5 -will drop back to 5 mg   Also refer to endocrinology for help with management Pt will be getting medicare in February       Relevant Medications   tirzepatide  5 MG/0.5ML injection vial   Other Relevant Orders   POCT HgB A1C (Completed)   Microalbumin / creatinine urine ratio   Other Visit Diagnoses       Long-term (current) use of injectable non-insulin  antidiabetic drugs

## 2024-08-01 NOTE — Patient Instructions (Addendum)
 Go back down on the zepbound  to 5 mg weeks If unable to tolerate that let us  know   Keep up the great work with lifestyle change   I put the referral in for endocrinology  Please let us  know if you don't hear in 1-2 weeks to set that up (mychart message or call or letter)   Lab today for  Urine microalbumin  TSH  I do want to get a thyroid  ultrasound when you get medicare

## 2024-08-01 NOTE — Assessment & Plan Note (Addendum)
 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

## 2024-08-01 NOTE — Assessment & Plan Note (Signed)
 TSH today  Continues levothyroxine  75 mcg daily

## 2024-08-03 ENCOUNTER — Ambulatory Visit: Payer: Self-pay | Admitting: Family Medicine

## 2024-08-03 DIAGNOSIS — R809 Proteinuria, unspecified: Secondary | ICD-10-CM | POA: Insufficient documentation

## 2024-08-03 DIAGNOSIS — E039 Hypothyroidism, unspecified: Secondary | ICD-10-CM

## 2024-08-03 MED ORDER — LEVOTHYROXINE SODIUM 75 MCG PO TABS: ORAL_TABLET | ORAL | 3 refills | Status: AC

## 2024-08-09 ENCOUNTER — Other Ambulatory Visit: Payer: Self-pay | Admitting: Family Medicine

## 2024-08-10 ENCOUNTER — Other Ambulatory Visit: Payer: Self-pay | Admitting: Family Medicine

## 2024-08-10 DIAGNOSIS — F410 Panic disorder [episodic paroxysmal anxiety] without agoraphobia: Secondary | ICD-10-CM

## 2024-08-11 ENCOUNTER — Other Ambulatory Visit: Payer: Self-pay | Admitting: Family Medicine

## 2024-08-13 ENCOUNTER — Other Ambulatory Visit (INDEPENDENT_AMBULATORY_CARE_PROVIDER_SITE_OTHER): Payer: Self-pay | Admitting: Vascular Surgery

## 2024-08-18 NOTE — Telephone Encounter (Signed)
 Patient will need to contact the office to scheduled appt for further refills

## 2024-10-17 ENCOUNTER — Other Ambulatory Visit: Payer: Self-pay | Admitting: Family Medicine

## 2024-12-16 ENCOUNTER — Ambulatory Visit: Payer: Self-pay | Admitting: Family Medicine
